# Patient Record
Sex: Male | Born: 1955 | Race: Black or African American | Hispanic: No | Marital: Single | Smoking: Never smoker
Health system: Southern US, Community
[De-identification: ages and names within clinical notes are randomized; demographics above are authoritative.]

---

## 2005-10-20 ENCOUNTER — Encounter: Admission: RE | Admit: 2005-10-20 | Discharge: 2005-10-20 | Payer: Self-pay | Admitting: Family Medicine

## 2009-12-19 ENCOUNTER — Encounter: Admission: RE | Admit: 2009-12-19 | Discharge: 2009-12-19 | Payer: Self-pay | Admitting: Family Medicine

## 2011-01-06 ENCOUNTER — Other Ambulatory Visit: Payer: Self-pay | Admitting: Orthopedic Surgery

## 2011-01-06 ENCOUNTER — Ambulatory Visit
Admission: RE | Admit: 2011-01-06 | Discharge: 2011-01-06 | Disposition: A | Discharge status: Home / Self Care | Payer: BC Managed Care – PPO | Source: Ambulatory Visit | Attending: Orthopedic Surgery | Admitting: Orthopedic Surgery

## 2011-01-06 DIAGNOSIS — IMO0001 Reserved for inherently not codable concepts without codable children: Secondary | ICD-10-CM

## 2011-09-08 IMAGING — CR DG FOOT COMPLETE 3+V*L*
4 series · 4 of 4 positions shown · non-contrast
Comparison: None.

CLINICAL DATA: Pain, query gout

LEFT FOOT - COMPLETE 3+ VIEW

[t foot ap left]
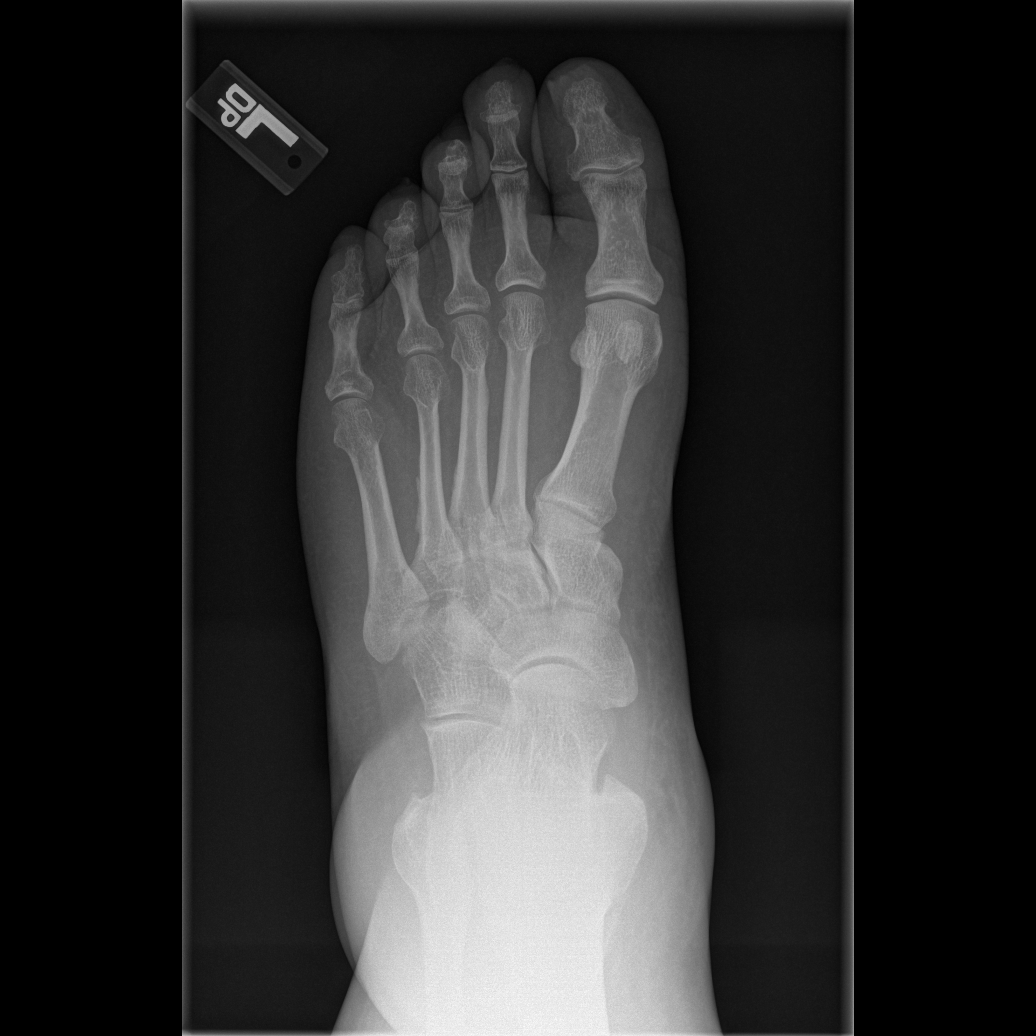

[t foot oblique left (1 of 2)]
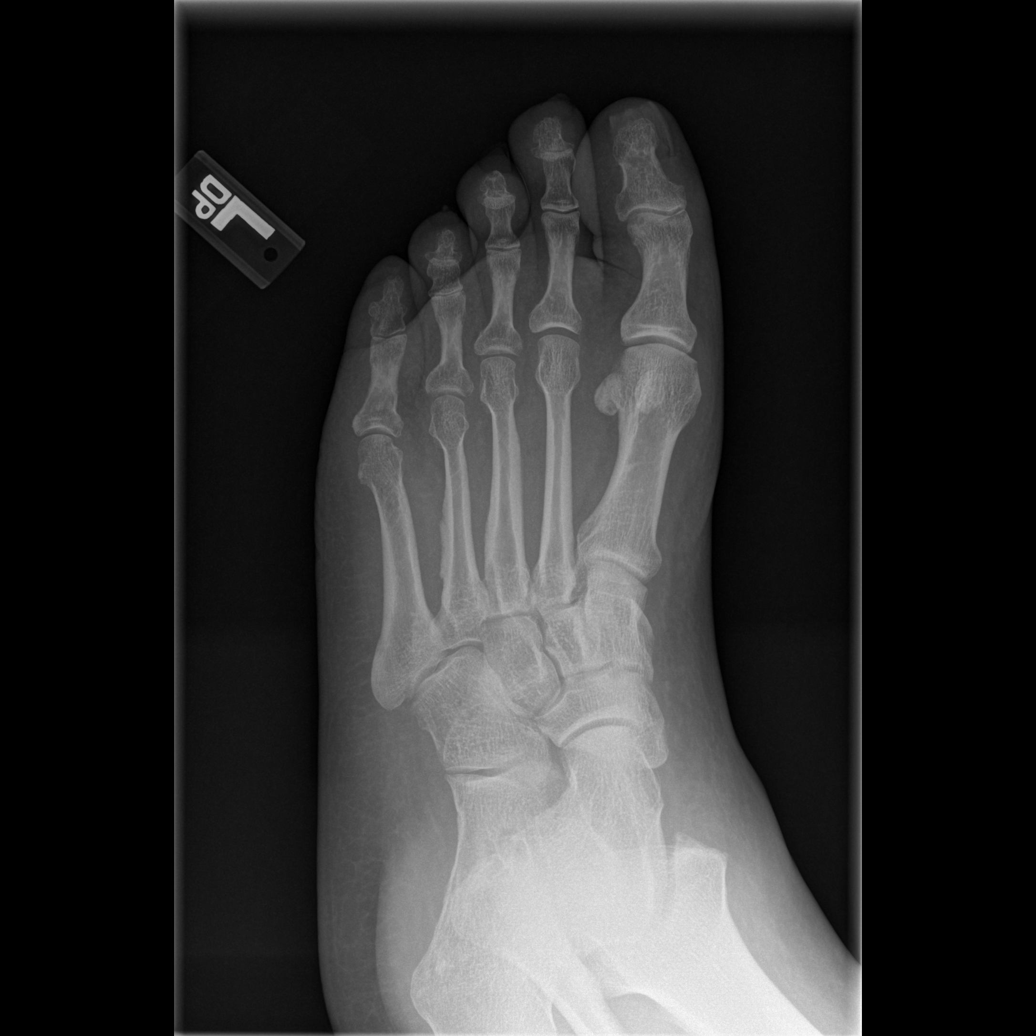

[t foot oblique left (2 of 2)]
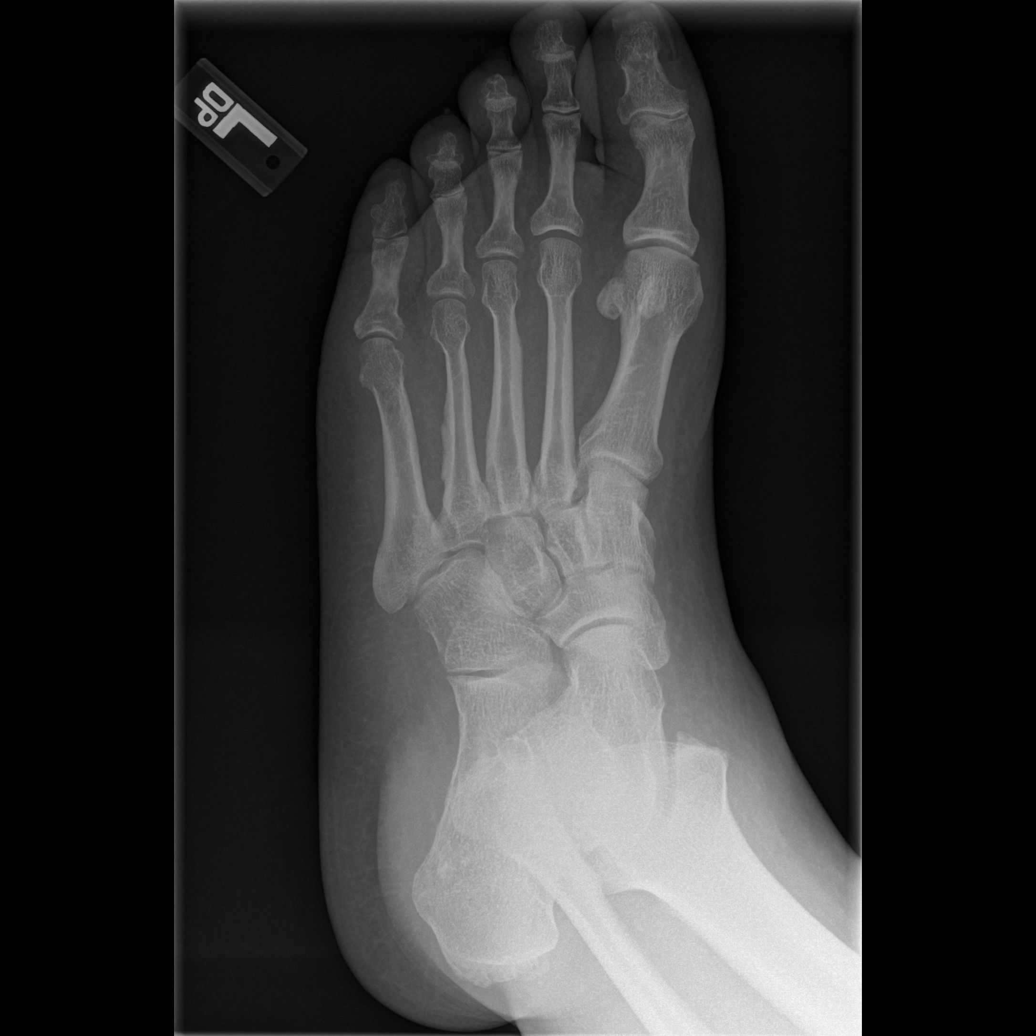

[t foot lat left]
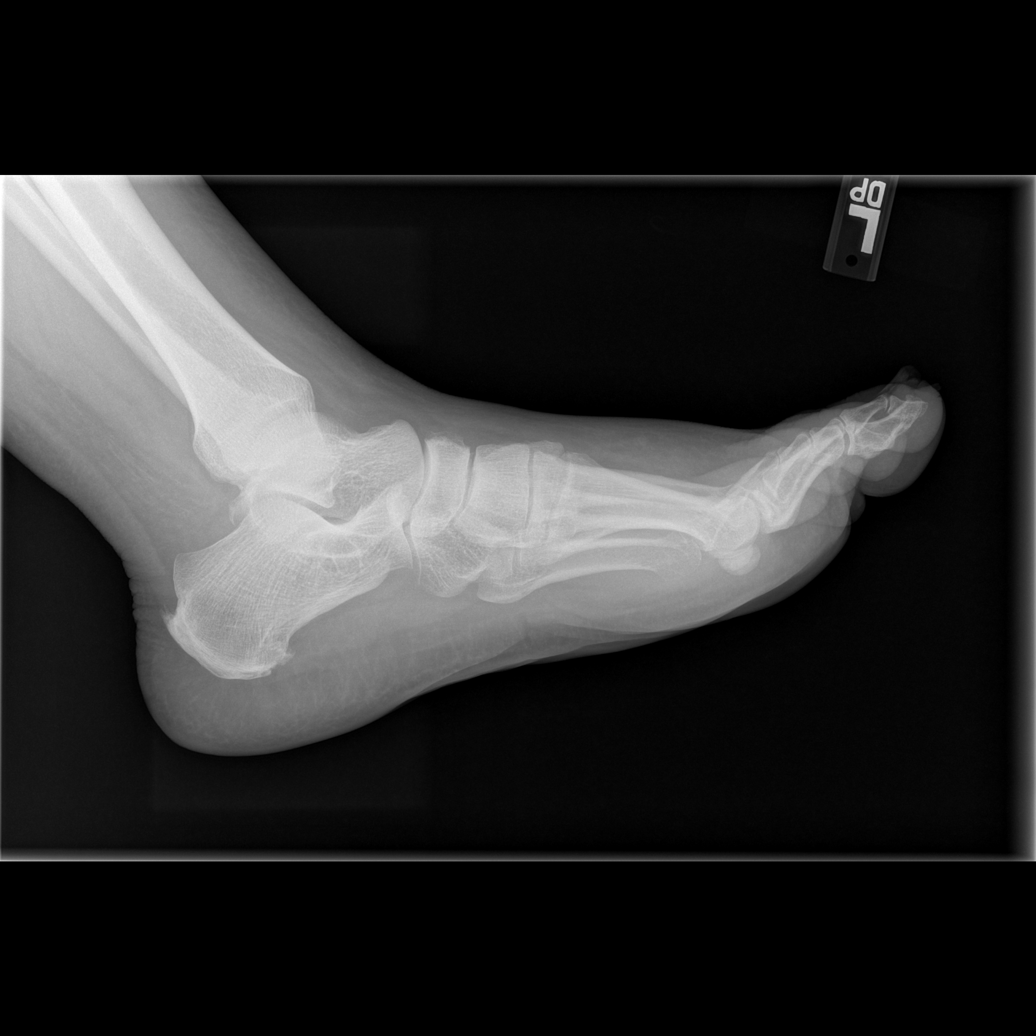

[4 of 4 positions shown; findings below may reference images not displayed]

FINDINGS: Joint spaces well maintained.  The metatarsal phalangeal
joint are normal.  There is subchondral cyst the proximal phalanx
of the first ray.  No soft tissue abnormality.  Normal bone
density.
IMPRESSION: 1.  No specific findings of gout.
2.  Mild osteoarthritis at the DIP joint of the first ray.

## 2012-09-25 IMAGING — CR DG KNEE 1-2V*L*
2 series · 2 of 2 positions shown · non-contrast
Comparison: 12/19/2009

CLINICAL DATA: Strain of the medial collateral ligament of the left
knee.

LEFT KNEE - 1-2 VIEW

[t knee ap left]
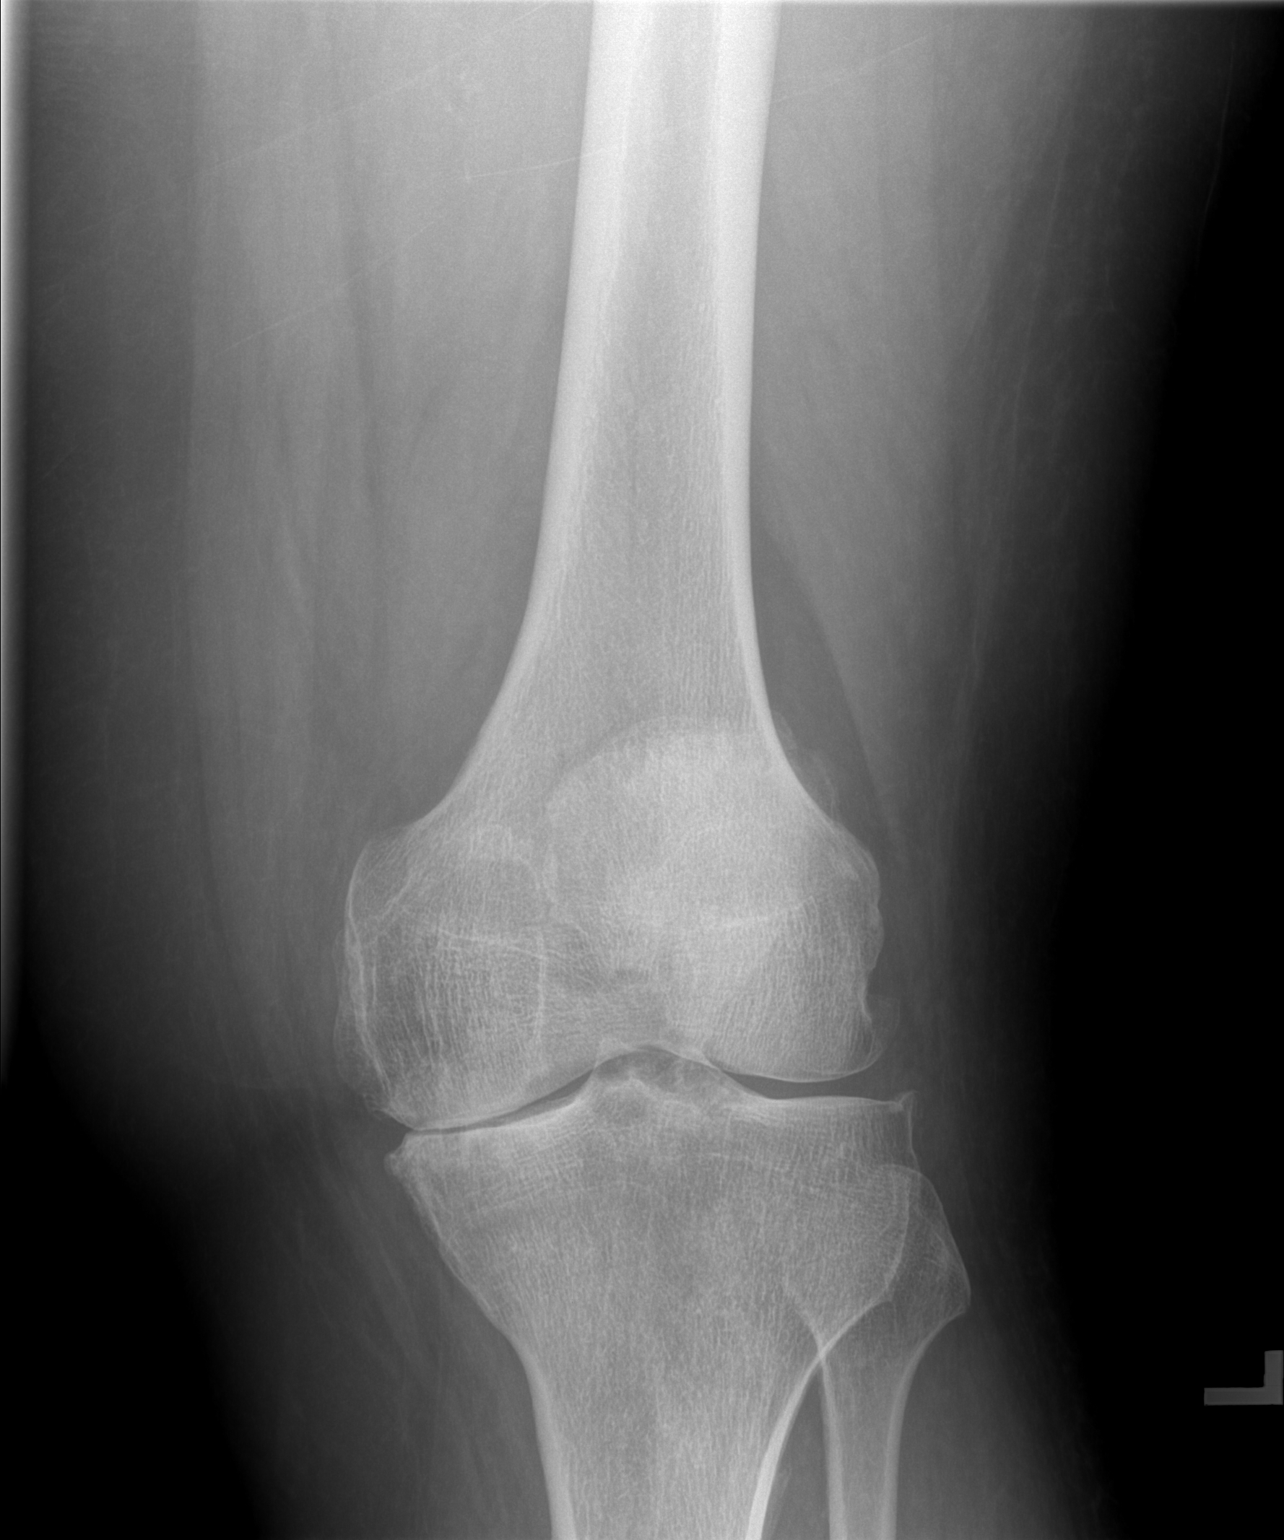

[t knee lat left]
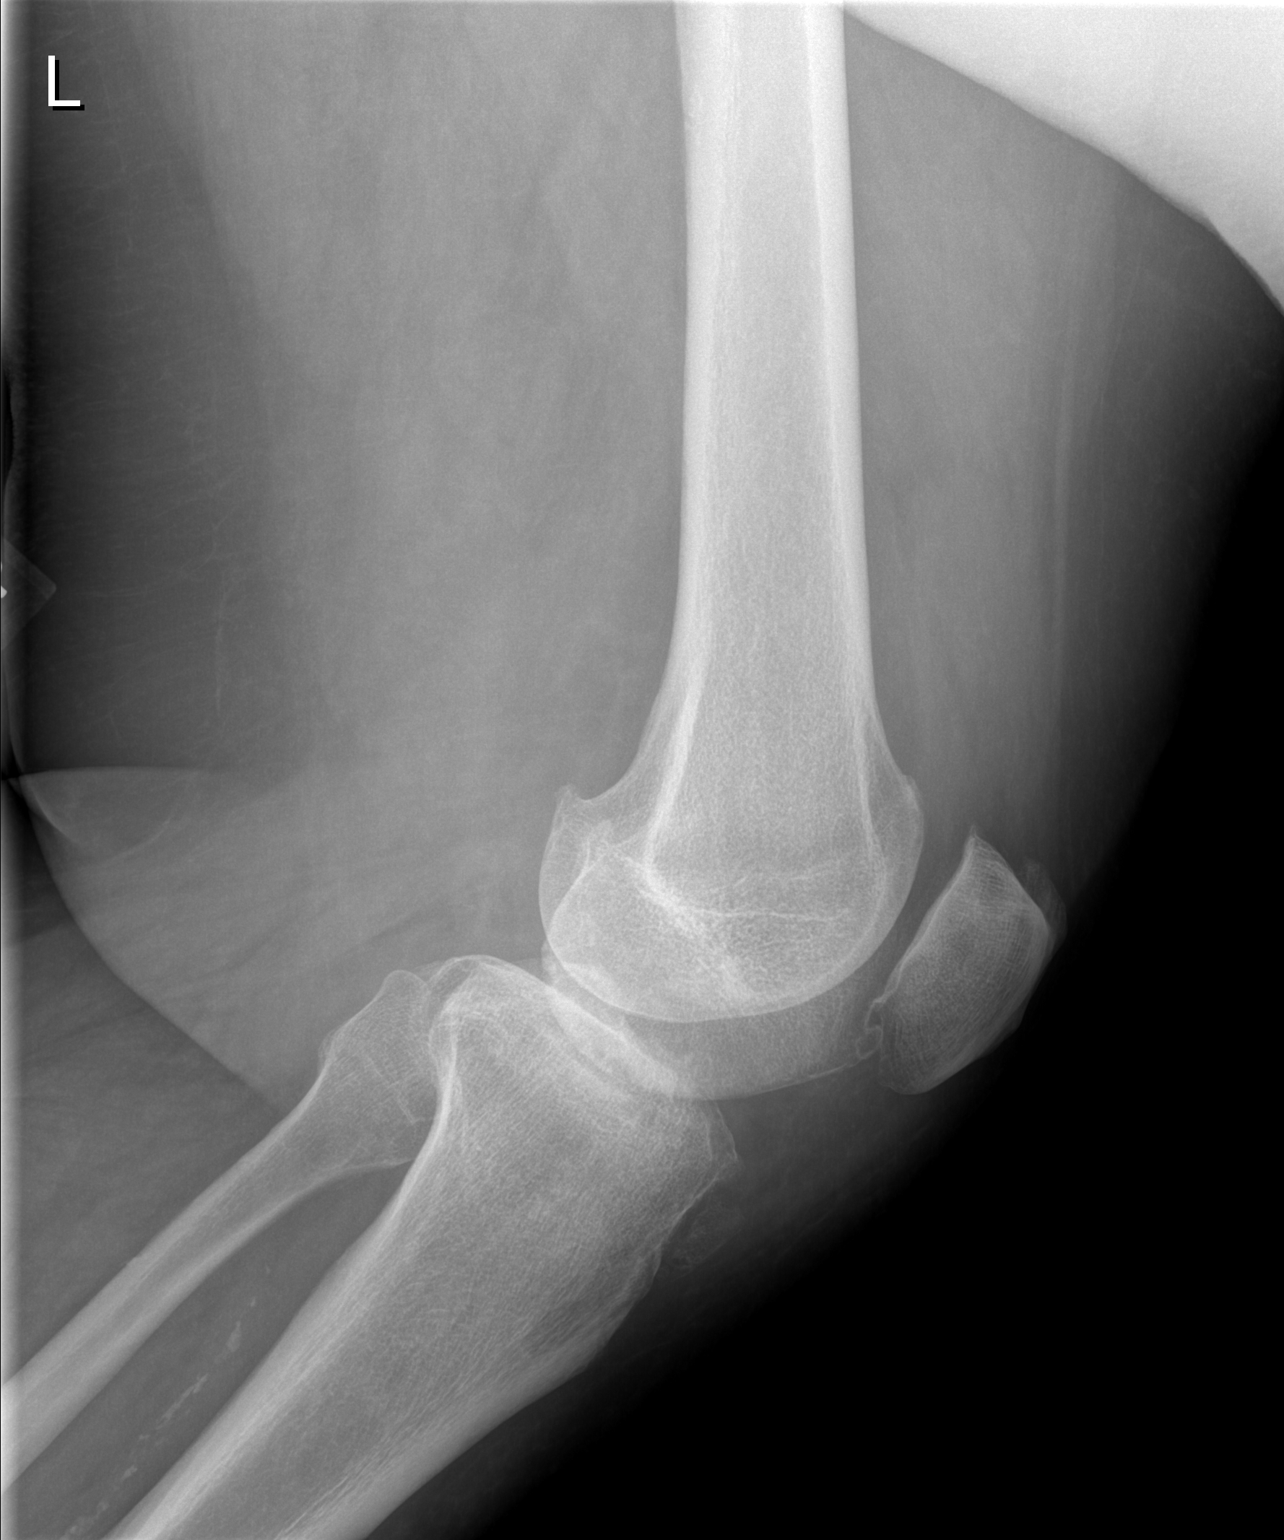

[2 of 2 positions shown; findings below may reference images not displayed]

FINDINGS: There is increased narrowing of the medial joint space
with sclerosis and slight subchondral cyst formation in the medial
tibial plateau with slight marginal spur formation medially and
laterally.  There is a small joint effusion.

No acute osseous abnormality.
IMPRESSION: Progressive osteoarthritis of the left knee, primarily in the
medial compartment.  Small joint effusion.

## 2022-05-02 ENCOUNTER — Telehealth: Payer: Self-pay | Admitting: *Deleted

## 2022-05-02 NOTE — Patient Outreach (Signed)
  Care Coordination   05/02/2022 Name: KHADIR ROAM MRN: [id number] DOB: 16-Jul-1956   Care Coordination Outreach Attempts:  An unsuccessful telephone outreach was attempted today to offer the patient information about available care coordination services as a benefit of their health plan.   Follow Up Plan:  Additional outreach attempts will be made to offer the patient care coordination information and services.   Encounter Outcome:  No Answer  Care Coordination Interventions Activated:  No   Care Coordination Interventions:  No, not indicated    Irving Shows [facility], BSN [facility] RN Care Coordinator [phone]

## 2022-05-05 ENCOUNTER — Telehealth: Payer: Self-pay | Admitting: *Deleted

## 2022-05-05 NOTE — Patient Outreach (Signed)
  Care Coordination   05/05/2022 Name: Levi [name] MRN: [id number] DOB: 1956-07-18   Care Coordination Outreach Attempts:  A second unsuccessful outreach was attempted today to offer the patient with information about available care coordination services as a benefit of their health plan.     Follow Up Plan:  Additional outreach attempts will be made to offer the patient care coordination information and services.   Encounter Outcome:  No Answer  Care Coordination Interventions Activated:  No   Care Coordination Interventions:  No, not indicated    Irving Shows [facility], BSN [facility] RN Care Coordinator [phone]

## 2022-05-06 ENCOUNTER — Telehealth: Payer: Self-pay | Admitting: *Deleted

## 2022-05-06 NOTE — Patient Outreach (Signed)
  Care Coordination   05/06/2022 Name: Levi [name] MRN: [id number] DOB: [birth year]   Care Coordination Outreach Attempts:  A third unsuccessful outreach was attempted today to offer the patient with information about available care coordination services as a benefit of their health plan.   Follow Up Plan:  No further outreach attempts will be made at this time. We have been unable to contact the patient to offer or enroll patient in care coordination services  Encounter Outcome:  No Answer  Care Coordination Interventions Activated:  No   Care Coordination Interventions:  No, not indicated    Irving Shows [facility], BSN [facility] RN Care Coordinator [phone]

## 2023-07-14 DIAGNOSIS — Z5919 Other inadequate housing: Secondary | ICD-10-CM | POA: Diagnosis not present

## 2023-07-14 DIAGNOSIS — Z7982 Long term (current) use of aspirin: Secondary | ICD-10-CM | POA: Diagnosis not present

## 2023-07-14 DIAGNOSIS — I872 Venous insufficiency (chronic) (peripheral): Secondary | ICD-10-CM | POA: Diagnosis not present

## 2023-07-14 DIAGNOSIS — M159 Polyosteoarthritis, unspecified: Secondary | ICD-10-CM | POA: Diagnosis not present

## 2023-07-14 DIAGNOSIS — Z008 Encounter for other general examination: Secondary | ICD-10-CM | POA: Diagnosis not present

## 2023-07-14 DIAGNOSIS — Z8249 Family history of ischemic heart disease and other diseases of the circulatory system: Secondary | ICD-10-CM | POA: Diagnosis not present

## 2023-07-14 DIAGNOSIS — Z91199 Patient's noncompliance with other medical treatment and regimen due to unspecified reason: Secondary | ICD-10-CM | POA: Diagnosis not present

## 2023-07-14 DIAGNOSIS — Z6841 Body Mass Index (BMI) 40.0 and over, adult: Secondary | ICD-10-CM | POA: Diagnosis not present

## 2023-07-14 DIAGNOSIS — Z791 Long term (current) use of non-steroidal anti-inflammatories (NSAID): Secondary | ICD-10-CM | POA: Diagnosis not present

## 2023-07-14 DIAGNOSIS — K08109 Complete loss of teeth, unspecified cause, unspecified class: Secondary | ICD-10-CM | POA: Diagnosis not present

## 2023-07-14 DIAGNOSIS — R030 Elevated blood-pressure reading, without diagnosis of hypertension: Secondary | ICD-10-CM | POA: Diagnosis not present

## 2023-07-24 ENCOUNTER — Emergency Department (HOSPITAL_COMMUNITY): Payer: Self-pay

## 2023-07-24 ENCOUNTER — Inpatient Hospital Stay (HOSPITAL_COMMUNITY)
Admission: EM | Admit: 2023-07-24 | Discharge: 2023-08-26 | DRG: 291 | Disposition: E | Payer: Medicare HMO | Source: Ambulatory Visit | Attending: Internal Medicine | Admitting: Internal Medicine

## 2023-07-24 ENCOUNTER — Other Ambulatory Visit: Payer: Self-pay

## 2023-07-24 ENCOUNTER — Encounter (HOSPITAL_COMMUNITY): Payer: Self-pay

## 2023-07-24 DIAGNOSIS — K589 Irritable bowel syndrome without diarrhea: Secondary | ICD-10-CM | POA: Diagnosis present

## 2023-07-24 DIAGNOSIS — Z114 Encounter for screening for human immunodeficiency virus [HIV]: Secondary | ICD-10-CM | POA: Diagnosis not present

## 2023-07-24 DIAGNOSIS — J440 Chronic obstructive pulmonary disease with acute lower respiratory infection: Secondary | ICD-10-CM | POA: Diagnosis not present

## 2023-07-24 DIAGNOSIS — N179 Acute kidney failure, unspecified: Secondary | ICD-10-CM

## 2023-07-24 DIAGNOSIS — E1169 Type 2 diabetes mellitus with other specified complication: Secondary | ICD-10-CM | POA: Diagnosis not present

## 2023-07-24 DIAGNOSIS — I700 Atherosclerosis of aorta: Secondary | ICD-10-CM

## 2023-07-24 DIAGNOSIS — Z125 Encounter for screening for malignant neoplasm of prostate: Secondary | ICD-10-CM | POA: Diagnosis not present

## 2023-07-24 DIAGNOSIS — A419 Sepsis, unspecified organism: Secondary | ICD-10-CM | POA: Diagnosis not present

## 2023-07-24 DIAGNOSIS — Z781 Physical restraint status: Secondary | ICD-10-CM

## 2023-07-24 DIAGNOSIS — G253 Myoclonus: Secondary | ICD-10-CM | POA: Diagnosis not present

## 2023-07-24 DIAGNOSIS — R0902 Hypoxemia: Secondary | ICD-10-CM | POA: Diagnosis not present

## 2023-07-24 DIAGNOSIS — J449 Chronic obstructive pulmonary disease, unspecified: Secondary | ICD-10-CM | POA: Diagnosis not present

## 2023-07-24 DIAGNOSIS — I10 Essential (primary) hypertension: Secondary | ICD-10-CM | POA: Diagnosis not present

## 2023-07-24 DIAGNOSIS — E662 Morbid (severe) obesity with alveolar hypoventilation: Secondary | ICD-10-CM | POA: Diagnosis not present

## 2023-07-24 DIAGNOSIS — I480 Paroxysmal atrial fibrillation: Secondary | ICD-10-CM | POA: Diagnosis not present

## 2023-07-24 DIAGNOSIS — G9341 Metabolic encephalopathy: Secondary | ICD-10-CM | POA: Diagnosis not present

## 2023-07-24 DIAGNOSIS — I429 Cardiomyopathy, unspecified: Secondary | ICD-10-CM | POA: Diagnosis present

## 2023-07-24 DIAGNOSIS — K761 Chronic passive congestion of liver: Secondary | ICD-10-CM | POA: Diagnosis not present

## 2023-07-24 DIAGNOSIS — I5021 Acute systolic (congestive) heart failure: Secondary | ICD-10-CM | POA: Diagnosis not present

## 2023-07-24 DIAGNOSIS — N289 Disorder of kidney and ureter, unspecified: Secondary | ICD-10-CM | POA: Diagnosis not present

## 2023-07-24 DIAGNOSIS — I5082 Biventricular heart failure: Secondary | ICD-10-CM | POA: Diagnosis not present

## 2023-07-24 DIAGNOSIS — Z515 Encounter for palliative care: Secondary | ICD-10-CM | POA: Diagnosis not present

## 2023-07-24 DIAGNOSIS — J9602 Acute respiratory failure with hypercapnia: Secondary | ICD-10-CM | POA: Diagnosis not present

## 2023-07-24 DIAGNOSIS — Z1159 Encounter for screening for other viral diseases: Secondary | ICD-10-CM | POA: Diagnosis not present

## 2023-07-24 DIAGNOSIS — I5043 Acute on chronic combined systolic (congestive) and diastolic (congestive) heart failure: Secondary | ICD-10-CM | POA: Diagnosis not present

## 2023-07-24 DIAGNOSIS — J9600 Acute respiratory failure, unspecified whether with hypoxia or hypercapnia: Secondary | ICD-10-CM | POA: Diagnosis not present

## 2023-07-24 DIAGNOSIS — R609 Edema, unspecified: Secondary | ICD-10-CM | POA: Diagnosis not present

## 2023-07-24 DIAGNOSIS — J9690 Respiratory failure, unspecified, unspecified whether with hypoxia or hypercapnia: Secondary | ICD-10-CM | POA: Diagnosis not present

## 2023-07-24 DIAGNOSIS — I272 Pulmonary hypertension, unspecified: Secondary | ICD-10-CM | POA: Diagnosis not present

## 2023-07-24 DIAGNOSIS — J690 Pneumonitis due to inhalation of food and vomit: Secondary | ICD-10-CM | POA: Diagnosis not present

## 2023-07-24 DIAGNOSIS — J189 Pneumonia, unspecified organism: Secondary | ICD-10-CM | POA: Diagnosis not present

## 2023-07-24 DIAGNOSIS — J9811 Atelectasis: Secondary | ICD-10-CM | POA: Diagnosis not present

## 2023-07-24 DIAGNOSIS — Z6841 Body Mass Index (BMI) 40.0 and over, adult: Secondary | ICD-10-CM | POA: Diagnosis not present

## 2023-07-24 DIAGNOSIS — K567 Ileus, unspecified: Secondary | ICD-10-CM | POA: Diagnosis not present

## 2023-07-24 DIAGNOSIS — M1710 Unilateral primary osteoarthritis, unspecified knee: Secondary | ICD-10-CM | POA: Diagnosis present

## 2023-07-24 DIAGNOSIS — J441 Chronic obstructive pulmonary disease with (acute) exacerbation: Secondary | ICD-10-CM | POA: Diagnosis not present

## 2023-07-24 DIAGNOSIS — E871 Hypo-osmolality and hyponatremia: Secondary | ICD-10-CM | POA: Diagnosis present

## 2023-07-24 DIAGNOSIS — E1122 Type 2 diabetes mellitus with diabetic chronic kidney disease: Secondary | ICD-10-CM | POA: Diagnosis not present

## 2023-07-24 DIAGNOSIS — E875 Hyperkalemia: Secondary | ICD-10-CM | POA: Diagnosis not present

## 2023-07-24 DIAGNOSIS — Z791 Long term (current) use of non-steroidal anti-inflammatories (NSAID): Secondary | ICD-10-CM

## 2023-07-24 DIAGNOSIS — I472 Ventricular tachycardia, unspecified: Secondary | ICD-10-CM

## 2023-07-24 DIAGNOSIS — N170 Acute kidney failure with tubular necrosis: Secondary | ICD-10-CM | POA: Diagnosis not present

## 2023-07-24 DIAGNOSIS — Z0000 Encounter for general adult medical examination without abnormal findings: Secondary | ICD-10-CM | POA: Diagnosis not present

## 2023-07-24 DIAGNOSIS — Z79899 Other long term (current) drug therapy: Secondary | ICD-10-CM | POA: Diagnosis not present

## 2023-07-24 DIAGNOSIS — L89892 Pressure ulcer of other site, stage 2: Secondary | ICD-10-CM | POA: Diagnosis present

## 2023-07-24 DIAGNOSIS — I509 Heart failure, unspecified: Secondary | ICD-10-CM | POA: Diagnosis not present

## 2023-07-24 DIAGNOSIS — L8990 Pressure ulcer of unspecified site, unspecified stage: Secondary | ICD-10-CM | POA: Insufficient documentation

## 2023-07-24 DIAGNOSIS — R0989 Other specified symptoms and signs involving the circulatory and respiratory systems: Secondary | ICD-10-CM | POA: Diagnosis not present

## 2023-07-24 DIAGNOSIS — J9601 Acute respiratory failure with hypoxia: Secondary | ICD-10-CM | POA: Diagnosis not present

## 2023-07-24 DIAGNOSIS — T39315A Adverse effect of propionic acid derivatives, initial encounter: Secondary | ICD-10-CM | POA: Diagnosis present

## 2023-07-24 DIAGNOSIS — E876 Hypokalemia: Secondary | ICD-10-CM | POA: Diagnosis not present

## 2023-07-24 DIAGNOSIS — M1712 Unilateral primary osteoarthritis, left knee: Secondary | ICD-10-CM | POA: Diagnosis not present

## 2023-07-24 DIAGNOSIS — R918 Other nonspecific abnormal finding of lung field: Secondary | ICD-10-CM | POA: Diagnosis not present

## 2023-07-24 DIAGNOSIS — Z136 Encounter for screening for cardiovascular disorders: Secondary | ICD-10-CM | POA: Diagnosis not present

## 2023-07-24 DIAGNOSIS — R059 Cough, unspecified: Secondary | ICD-10-CM | POA: Diagnosis not present

## 2023-07-24 DIAGNOSIS — E1165 Type 2 diabetes mellitus with hyperglycemia: Secondary | ICD-10-CM | POA: Diagnosis present

## 2023-07-24 DIAGNOSIS — R600 Localized edema: Secondary | ICD-10-CM | POA: Diagnosis not present

## 2023-07-24 DIAGNOSIS — R569 Unspecified convulsions: Secondary | ICD-10-CM | POA: Diagnosis not present

## 2023-07-24 DIAGNOSIS — I2489 Other forms of acute ischemic heart disease: Secondary | ICD-10-CM | POA: Diagnosis not present

## 2023-07-24 DIAGNOSIS — J90 Pleural effusion, not elsewhere classified: Secondary | ICD-10-CM | POA: Diagnosis not present

## 2023-07-24 DIAGNOSIS — I4891 Unspecified atrial fibrillation: Secondary | ICD-10-CM | POA: Diagnosis not present

## 2023-07-24 DIAGNOSIS — J9859 Other diseases of mediastinum, not elsewhere classified: Secondary | ICD-10-CM | POA: Diagnosis not present

## 2023-07-24 DIAGNOSIS — I5023 Acute on chronic systolic (congestive) heart failure: Secondary | ICD-10-CM | POA: Diagnosis not present

## 2023-07-24 DIAGNOSIS — E870 Hyperosmolality and hypernatremia: Secondary | ICD-10-CM | POA: Diagnosis not present

## 2023-07-24 DIAGNOSIS — I77810 Thoracic aortic ectasia: Secondary | ICD-10-CM | POA: Diagnosis present

## 2023-07-24 DIAGNOSIS — J14 Pneumonia due to Hemophilus influenzae: Secondary | ICD-10-CM | POA: Diagnosis not present

## 2023-07-24 DIAGNOSIS — I5041 Acute combined systolic (congestive) and diastolic (congestive) heart failure: Secondary | ICD-10-CM | POA: Diagnosis not present

## 2023-07-24 DIAGNOSIS — J9622 Acute and chronic respiratory failure with hypercapnia: Secondary | ICD-10-CM

## 2023-07-24 DIAGNOSIS — Z66 Do not resuscitate: Secondary | ICD-10-CM | POA: Diagnosis not present

## 2023-07-24 DIAGNOSIS — I959 Hypotension, unspecified: Secondary | ICD-10-CM | POA: Diagnosis not present

## 2023-07-24 DIAGNOSIS — Z7982 Long term (current) use of aspirin: Secondary | ICD-10-CM

## 2023-07-24 DIAGNOSIS — Y95 Nosocomial condition: Secondary | ICD-10-CM | POA: Diagnosis not present

## 2023-07-24 DIAGNOSIS — J811 Chronic pulmonary edema: Secondary | ICD-10-CM | POA: Diagnosis not present

## 2023-07-24 DIAGNOSIS — Z452 Encounter for adjustment and management of vascular access device: Secondary | ICD-10-CM | POA: Diagnosis not present

## 2023-07-24 DIAGNOSIS — I471 Supraventricular tachycardia, unspecified: Secondary | ICD-10-CM | POA: Diagnosis not present

## 2023-07-24 DIAGNOSIS — J9621 Acute and chronic respiratory failure with hypoxia: Secondary | ICD-10-CM | POA: Diagnosis not present

## 2023-07-24 DIAGNOSIS — R6520 Severe sepsis without septic shock: Secondary | ICD-10-CM | POA: Diagnosis not present

## 2023-07-24 DIAGNOSIS — R4182 Altered mental status, unspecified: Secondary | ICD-10-CM | POA: Diagnosis not present

## 2023-07-24 DIAGNOSIS — R7989 Other specified abnormal findings of blood chemistry: Secondary | ICD-10-CM | POA: Diagnosis not present

## 2023-07-24 DIAGNOSIS — J984 Other disorders of lung: Secondary | ICD-10-CM | POA: Diagnosis present

## 2023-07-24 DIAGNOSIS — R0602 Shortness of breath: Secondary | ICD-10-CM | POA: Diagnosis not present

## 2023-07-24 DIAGNOSIS — K5730 Diverticulosis of large intestine without perforation or abscess without bleeding: Secondary | ICD-10-CM | POA: Diagnosis not present

## 2023-07-24 DIAGNOSIS — I5040 Unspecified combined systolic (congestive) and diastolic (congestive) heart failure: Secondary | ICD-10-CM | POA: Diagnosis not present

## 2023-07-24 DIAGNOSIS — T39015A Adverse effect of aspirin, initial encounter: Secondary | ICD-10-CM | POA: Diagnosis present

## 2023-07-24 DIAGNOSIS — R339 Retention of urine, unspecified: Secondary | ICD-10-CM | POA: Diagnosis not present

## 2023-07-24 DIAGNOSIS — I493 Ventricular premature depolarization: Secondary | ICD-10-CM | POA: Diagnosis not present

## 2023-07-24 DIAGNOSIS — K760 Fatty (change of) liver, not elsewhere classified: Secondary | ICD-10-CM | POA: Diagnosis not present

## 2023-07-24 DIAGNOSIS — R001 Bradycardia, unspecified: Secondary | ICD-10-CM | POA: Diagnosis not present

## 2023-07-24 DIAGNOSIS — Z4682 Encounter for fitting and adjustment of non-vascular catheter: Secondary | ICD-10-CM | POA: Diagnosis not present

## 2023-07-24 DIAGNOSIS — I517 Cardiomegaly: Secondary | ICD-10-CM | POA: Diagnosis not present

## 2023-07-24 DIAGNOSIS — K3189 Other diseases of stomach and duodenum: Secondary | ICD-10-CM | POA: Diagnosis not present

## 2023-07-24 LAB — URINALYSIS, COMPLETE (UACMP) WITH MICROSCOPIC
Bilirubin Urine: NEGATIVE
Glucose, UA: NEGATIVE mg/dL
Ketones, ur: NEGATIVE mg/dL
Nitrite: NEGATIVE
Protein, ur: 30 mg/dL — AB
Specific Gravity, Urine: 1.014 (ref 1.005–1.030)
WBC, UA: >50 WBC/hpf (ref 0–5)
pH: 5 (ref 5.0–8.0)

## 2023-07-24 LAB — CBC WITH DIFFERENTIAL/PLATELET
Abs Immature Granulocytes: 0.68 10*3/uL — ABNORMAL HIGH (ref 0.00–0.07)
Basophils Absolute: 0.1 10*3/uL (ref 0.0–0.1)
Basophils Relative: 0 %
Eosinophils Absolute: 0 10*3/uL (ref 0.0–0.5)
Eosinophils Relative: 0 %
HCT: 52 % (ref 39.0–52.0)
Hemoglobin: 15.6 g/dL (ref 13.0–17.0)
Immature Granulocytes: 6 %
Lymphocytes Relative: 8 %
Lymphs Abs: 0.9 10*3/uL (ref 0.7–4.0)
MCH: 26.2 pg (ref 26.0–34.0)
MCHC: 30 g/dL (ref 30.0–36.0)
MCV: 87.4 fL (ref 80.0–100.0)
Monocytes Absolute: 1 10*3/uL (ref 0.1–1.0)
Monocytes Relative: 9 %
Neutro Abs: 8.8 10*3/uL — ABNORMAL HIGH (ref 1.7–7.7)
Neutrophils Relative %: 77 %
Platelets: 184 10*3/uL (ref 150–400)
RBC: 5.95 MIL/uL — ABNORMAL HIGH (ref 4.22–5.81)
RDW: 18.5 % — ABNORMAL HIGH (ref 11.5–15.5)
WBC: 11.5 10*3/uL — ABNORMAL HIGH (ref 4.0–10.5)
nRBC: 1.7 % — ABNORMAL HIGH (ref 0.0–0.2)

## 2023-07-24 LAB — RAPID URINE DRUG SCREEN, HOSP PERFORMED
Amphetamines: NOT DETECTED
Barbiturates: NOT DETECTED
Benzodiazepines: NOT DETECTED
Cocaine: NOT DETECTED
Opiates: NOT DETECTED
Tetrahydrocannabinol: NOT DETECTED

## 2023-07-24 LAB — COMPREHENSIVE METABOLIC PANEL
ALT: 700 U/L — ABNORMAL HIGH (ref 0–44)
ALT: 666 U/L — ABNORMAL HIGH (ref 0–44)
AST: 475 U/L — ABNORMAL HIGH (ref 15–41)
AST: 426 U/L — ABNORMAL HIGH (ref 15–41)
Albumin: 4.4 g/dL (ref 3.5–5.0)
Albumin: 4.4 g/dL (ref 3.5–5.0)
Alkaline Phosphatase: 211 U/L — ABNORMAL HIGH (ref 38–126)
Alkaline Phosphatase: 198 U/L — ABNORMAL HIGH (ref 38–126)
Anion gap: 15 (ref 5–15)
Anion gap: 16 — ABNORMAL HIGH (ref 5–15)
BUN: 86 mg/dL — ABNORMAL HIGH (ref 8–23)
BUN: 91 mg/dL — ABNORMAL HIGH (ref 8–23)
CO2: 30 mmol/L (ref 22–32)
CO2: 26 mmol/L (ref 22–32)
Calcium: 8.9 mg/dL (ref 8.9–10.3)
Calcium: 8.5 mg/dL — ABNORMAL LOW (ref 8.9–10.3)
Chloride: 89 mmol/L — ABNORMAL LOW (ref 98–111)
Chloride: 90 mmol/L — ABNORMAL LOW (ref 98–111)
Creatinine, Ser: 3.6 mg/dL — ABNORMAL HIGH (ref 0.61–1.24)
Creatinine, Ser: 3.33 mg/dL — ABNORMAL HIGH (ref 0.61–1.24)
GFR, Estimated: 18 mL/min — ABNORMAL LOW (ref >60)
GFR, Estimated: 19 mL/min — ABNORMAL LOW (ref >60)
Glucose, Bld: 127 mg/dL — ABNORMAL HIGH (ref 70–99)
Glucose, Bld: 126 mg/dL — ABNORMAL HIGH (ref 70–99)
Potassium: 5.6 mmol/L — ABNORMAL HIGH (ref 3.5–5.1)
Potassium: 5.6 mmol/L — ABNORMAL HIGH (ref 3.5–5.1)
Sodium: 134 mmol/L — ABNORMAL LOW (ref 135–145)
Sodium: 132 mmol/L — ABNORMAL LOW (ref 135–145)
Total Bilirubin: 4.5 mg/dL — ABNORMAL HIGH (ref <1.2)
Total Bilirubin: 3.9 mg/dL — ABNORMAL HIGH (ref <1.2)
Total Protein: 8.7 g/dL — ABNORMAL HIGH (ref 6.5–8.1)
Total Protein: 8.7 g/dL — ABNORMAL HIGH (ref 6.5–8.1)

## 2023-07-24 LAB — RESP PANEL BY RT-PCR (RSV, FLU A&B, COVID)  RVPGX2
Influenza A by PCR: NEGATIVE
Influenza B by PCR: NEGATIVE
Resp Syncytial Virus by PCR: NEGATIVE
SARS Coronavirus 2 by RT PCR: NEGATIVE

## 2023-07-24 LAB — I-STAT CHEM 8, ED
BUN: 99 mg/dL — ABNORMAL HIGH (ref 8–23)
Calcium, Ion: 1.09 mmol/L — ABNORMAL LOW (ref 1.15–1.40)
Chloride: 91 mmol/L — ABNORMAL LOW (ref 98–111)
Creatinine, Ser: 3.1 mg/dL — ABNORMAL HIGH (ref 0.61–1.24)
Glucose, Bld: 123 mg/dL — ABNORMAL HIGH (ref 70–99)
HCT: 59 % — ABNORMAL HIGH (ref 39.0–52.0)
Hemoglobin: 20.1 g/dL — ABNORMAL HIGH (ref 13.0–17.0)
Potassium: 5.7 mmol/L — ABNORMAL HIGH (ref 3.5–5.1)
Sodium: 134 mmol/L — ABNORMAL LOW (ref 135–145)
TCO2: 33 mmol/L — ABNORMAL HIGH (ref 22–32)

## 2023-07-24 LAB — PHOSPHORUS: Phosphorus: 7.6 mg/dL — ABNORMAL HIGH (ref 2.5–4.6)

## 2023-07-24 LAB — I-STAT CG4 LACTIC ACID, ED
Lactic Acid, Venous: 1.3 mmol/L (ref 0.5–1.9)
Lactic Acid, Venous: 1.5 mmol/L (ref 0.5–1.9)

## 2023-07-24 LAB — BLOOD GAS, VENOUS
Acid-Base Excess: 0.9 mmol/L (ref 0.0–2.0)
Acid-base deficit: 0.5 mmol/L (ref 0.0–2.0)
Bicarbonate: 32.9 mmol/L — ABNORMAL HIGH (ref 20.0–28.0)
Bicarbonate: 34.3 mmol/L — ABNORMAL HIGH (ref 20.0–28.0)
O2 Saturation: 73.1 %
O2 Saturation: 45.3 %
Patient temperature: 37
Patient temperature: 37
pCO2, Ven: 111 mm[Hg] (ref 44–60)
pCO2, Ven: 113 mm[Hg] (ref 44–60)
pH, Ven: 7.08 — CL (ref 7.25–7.43)
pH, Ven: 7.09 — CL (ref 7.25–7.43)
pO2, Ven: 52 mm[Hg] — ABNORMAL HIGH (ref 32–45)
pO2, Ven: 36 mm[Hg] (ref 32–45)

## 2023-07-24 LAB — CBG MONITORING, ED: Glucose-Capillary: 119 mg/dL — ABNORMAL HIGH (ref 70–99)

## 2023-07-24 LAB — CK: Total CK: 396 U/L (ref 49–397)

## 2023-07-24 LAB — CREATININE, URINE, RANDOM: Creatinine, Urine: 300 mg/dL

## 2023-07-24 LAB — PROTIME-INR
INR: 1.5 — ABNORMAL HIGH (ref 0.8–1.2)
Prothrombin Time: 18.3 s — ABNORMAL HIGH (ref 11.4–15.2)

## 2023-07-24 LAB — TROPONIN I (HIGH SENSITIVITY)
Troponin I (High Sensitivity): 164 ng/L (ref <18)
Troponin I (High Sensitivity): 169 ng/L (ref <18)

## 2023-07-24 LAB — APTT: aPTT: 31 s (ref 24–36)

## 2023-07-24 LAB — AMMONIA: Ammonia: <10 umol/L (ref 9–35)

## 2023-07-24 LAB — BRAIN NATRIURETIC PEPTIDE: B Natriuretic Peptide: 798.8 pg/mL — ABNORMAL HIGH (ref 0.0–100.0)

## 2023-07-24 LAB — MAGNESIUM: Magnesium: 2.7 mg/dL — ABNORMAL HIGH (ref 1.7–2.4)

## 2023-07-24 MED ORDER — INSULIN ASPART 100 UNIT/ML IJ SOLN
0.0000 [IU] | INTRAMUSCULAR | Status: DC
  Administered 2023-07-26 – 2023-07-27 (×3): 1 [IU] via SUBCUTANEOUS
  Administered 2023-07-27 (×2): 2 [IU] via SUBCUTANEOUS
  Administered 2023-07-27: 1 [IU] via SUBCUTANEOUS
  Administered 2023-07-27 – 2023-07-28 (×3): 2 [IU] via SUBCUTANEOUS
  Administered 2023-07-28: 1 [IU] via SUBCUTANEOUS
  Administered 2023-07-28 (×2): 2 [IU] via SUBCUTANEOUS
  Administered 2023-07-28 – 2023-07-29 (×2): 1 [IU] via SUBCUTANEOUS
  Administered 2023-07-29: 3 [IU] via SUBCUTANEOUS
  Administered 2023-07-29 (×5): 2 [IU] via SUBCUTANEOUS
  Administered 2023-07-30: 3 [IU] via SUBCUTANEOUS
  Filled 2023-07-24: qty 0.09

## 2023-07-24 MED ORDER — FENTANYL 2500MCG IN NS 250ML (10MCG/ML) PREMIX INFUSION
0.0000 ug/h | INTRAVENOUS | Status: DC
  Administered 2023-07-25: 25 ug/h via INTRAVENOUS
  Administered 2023-07-25: 175 ug/h via INTRAVENOUS
  Administered 2023-07-25: 50 ug/h via INTRAVENOUS
  Administered 2023-07-26: 125 ug/h via INTRAVENOUS
  Administered 2023-07-27: 150 ug/h via INTRAVENOUS
  Administered 2023-07-27 – 2023-07-29 (×2): 125 ug/h via INTRAVENOUS
  Administered 2023-07-29: 100 ug/h via INTRAVENOUS
  Filled 2023-07-24 (×7): qty 250

## 2023-07-24 MED ORDER — MIDAZOLAM HCL 5 MG/5ML IJ SOLN
INTRAMUSCULAR | Status: AC | PRN
  Administered 2023-07-24: 4 mg via INTRAVENOUS

## 2023-07-24 MED ORDER — ETOMIDATE 2 MG/ML IV SOLN
20.0000 mg | Freq: Once | INTRAVENOUS | Status: DC
  Filled 2023-07-24: qty 10

## 2023-07-24 MED ORDER — HEPARIN (PORCINE) 25000 UT/250ML-% IV SOLN
1900.0000 [IU]/h | INTRAVENOUS | Status: DC
  Administered 2023-07-24: 1900 [IU]/h via INTRAVENOUS
  Filled 2023-07-24: qty 250

## 2023-07-24 MED ORDER — NYSTATIN 100000 UNIT/GM EX CREA
TOPICAL_CREAM | Freq: Three times a day (TID) | CUTANEOUS | Status: DC
  Administered 2023-07-25 – 2023-08-05 (×15): 1 via TOPICAL
  Filled 2023-07-24 (×3): qty 30

## 2023-07-24 MED ORDER — FAMOTIDINE IN NACL 20-0.9 MG/50ML-% IV SOLN
20.0000 mg | INTRAVENOUS | Status: DC
  Administered 2023-07-25 – 2023-07-29 (×5): 20 mg via INTRAVENOUS
  Filled 2023-07-24 (×5): qty 50

## 2023-07-24 MED ORDER — DEXTROSE 50 % IV SOLN
1.0000 | Freq: Once | INTRAVENOUS | Status: AC
  Administered 2023-07-25: 50 mL via INTRAVENOUS
  Filled 2023-07-24: qty 50

## 2023-07-24 MED ORDER — MAGNESIUM SULFATE 2 GM/50ML IV SOLN
2.0000 g | Freq: Once | INTRAVENOUS | Status: AC
  Administered 2023-07-25: 2 g via INTRAVENOUS
  Filled 2023-07-24: qty 50

## 2023-07-24 MED ORDER — FUROSEMIDE 10 MG/ML IJ SOLN
60.0000 mg | Freq: Once | INTRAMUSCULAR | Status: AC
  Administered 2023-07-24: 60 mg via INTRAVENOUS
  Filled 2023-07-24: qty 8

## 2023-07-24 MED ORDER — MIDAZOLAM HCL 2 MG/2ML IJ SOLN
4.0000 mg | Freq: Once | INTRAMUSCULAR | Status: DC
  Filled 2023-07-24: qty 4

## 2023-07-24 MED ORDER — FENTANYL CITRATE PF 50 MCG/ML IJ SOSY
50.0000 ug | PREFILLED_SYRINGE | Freq: Once | INTRAMUSCULAR | Status: AC
  Administered 2023-07-25: 50 ug via INTRAVENOUS
  Filled 2023-07-24: qty 1

## 2023-07-24 MED ORDER — ACETAMINOPHEN 325 MG PO TABS
650.0000 mg | ORAL_TABLET | Freq: Four times a day (QID) | ORAL | Status: DC | PRN
  Administered 2023-07-27 – 2023-07-28 (×2): 650 mg via ORAL
  Filled 2023-07-24 (×2): qty 2

## 2023-07-24 MED ORDER — POLYETHYLENE GLYCOL 3350 17 G PO PACK: 17.0000 g | PACK | Freq: Every day | ORAL | Status: DC | PRN

## 2023-07-24 MED ORDER — ONDANSETRON HCL 4 MG/2ML IJ SOLN
4.0000 mg | Freq: Four times a day (QID) | INTRAMUSCULAR | Status: DC | PRN
  Administered 2023-08-01 (×2): 4 mg via INTRAVENOUS
  Filled 2023-07-24 (×2): qty 2

## 2023-07-24 MED ORDER — ASPIRIN 325 MG PO TBEC
325.0000 mg | DELAYED_RELEASE_TABLET | Freq: Every day | ORAL | Status: DC
  Filled 2023-07-24 (×3): qty 1

## 2023-07-24 MED ORDER — MIDAZOLAM HCL (PF) 5 MG/ML IJ SOLN: 4.0000 mg | Freq: Once | INTRAMUSCULAR | Status: DC

## 2023-07-24 MED ORDER — METOPROLOL TARTRATE 5 MG/5ML IV SOLN
5.0000 mg | Freq: Four times a day (QID) | INTRAVENOUS | Status: DC
  Filled 2023-07-24: qty 5

## 2023-07-24 MED ORDER — HEPARIN BOLUS VIA INFUSION
6000.0000 [IU] | Freq: Once | INTRAVENOUS | Status: AC
  Administered 2023-07-24: 6000 [IU] via INTRAVENOUS
  Filled 2023-07-24: qty 6000

## 2023-07-24 MED ORDER — INSULIN ASPART 100 UNIT/ML IV SOLN
5.0000 [IU] | Freq: Once | INTRAVENOUS | Status: AC
  Administered 2023-07-25: 5 [IU] via INTRAVENOUS
  Filled 2023-07-24: qty 0.05

## 2023-07-24 MED ORDER — DEXMEDETOMIDINE BOLUS VIA INFUSION
1.0000 ug/kg | Freq: Once | INTRAVENOUS | Status: DC
  Filled 2023-07-24 (×3): qty 207
  Filled 2023-07-24: qty 206.4
  Filled 2023-07-24: qty 207

## 2023-07-24 MED ORDER — ETOMIDATE 2 MG/ML IV SOLN
INTRAVENOUS | Status: AC | PRN
  Administered 2023-07-24: 20 mg via INTRAVENOUS

## 2023-07-24 MED ORDER — DOCUSATE SODIUM 100 MG PO CAPS: 100.0000 mg | ORAL_CAPSULE | Freq: Two times a day (BID) | ORAL | Status: DC | PRN

## 2023-07-24 MED ORDER — FUROSEMIDE 10 MG/ML IJ SOLN
80.0000 mg | Freq: Four times a day (QID) | INTRAMUSCULAR | Status: DC
  Administered 2023-07-25 (×2): 80 mg via INTRAVENOUS
  Filled 2023-07-24 (×2): qty 8

## 2023-07-24 NOTE — Progress Notes (Signed)
PHARMACY - ANTICOAGULATION CONSULT NOTE  Pharmacy Consult for Heparin Indication: pulmonary embolus  No Known Allergies  Patient Measurements: Height: 5\' 7"  (170.2 cm) Weight: (!) 206.4 kg (455 lb) IBW/kg (Calculated) : 66.1 Heparin Dosing Weight: 120 kg  Vital Signs: Temp: 97.5 F (36.4 C) (11/29 1843) Temp Source: Oral (11/29 1843) BP: 190/151 (11/29 1843) Pulse Rate: 87 (11/29 1955)  Labs: Recent Labs    07/24/23 1558 07/24/23 1815  HGB  --  15.6  HCT  --  52.0  PLT  --  184  CREATININE 3.60*  --   TROPONINIHS 164* 169*    Estimated Creatinine Clearance: 33.9 mL/min (A) (by C-G formula based on SCr of 3.6 mg/dL (H)).   Medical History: History reviewed. No pertinent past medical history.  Medications:  (Not in a hospital admission)  Infusions:   Assessment: 39 yoM presented to ED on 11/29 with cough, SOB, and leg swelling.  Pharmacy is consulted to dose heparin while PE is ruled out.   No prior to admission anticoagulation, but takes Aspirin 81mg  and Naproxen.   SCr 3.6 CBC: Hgb 15.6, Plt 184 Baseline coags ordered AST/ALT significantly elevated 475/700.  Tbili elevated at 4.5 (If Tbili > 6.6, will need to use aPTT for dosing instead of heparin levels)   Goal of Therapy:  Heparin level 0.3-0.7 units/ml Monitor platelets by anticoagulation protocol: Yes   Plan:  Give heparin 6000 units bolus IV x 1 Start heparin IV infusion at 1900 units/hr Heparin level 8 hours after starting Daily heparin level and CBC Follow up VQ Scan results   Lynann Beaver PharmD, BCPS [facility] main pharmacy [phone] 07/24/2023 8:57 PM

## 2023-07-24 NOTE — ED Provider Notes (Signed)
[place] EMERGENCY DEPARTMENT AT [facility] Provider Note   CSN: [id number] Arrival date & time: 07/24/23  1423     History  Chief Complaint  Patient presents with   Shortness of Breath   Cough    Levi [name] is a 67 y.o. male with no known medical history presented with shortness of breath.  Patient has been short of breath for the past 2 weeks however went to doctor's appointment today to become established and was noted to be hypoxic at 64% and was told to go to the ER after receiving albuterol treatment.  Patient states the physician he saw states he might have COPD.  Patient does have nonproductive cough as well.  Patient Nuys any chest pain but does endorse shortness of breath that is present at rest.  Patient does feel that his legs are both enlarged.  Patient does not normally wear oxygen.  Patient denies any fevers nausea vomiting, abdominal pain, sick contacts.  Home Medications Prior to Admission medications   Medication Sig Start Date End Date Taking? Authorizing Provider  aspirin EC 81 MG tablet Take 81 mg by mouth daily. Swallow whole.   Yes [provider]  naproxen sodium (ALEVE) 220 MG tablet Take 220 mg by mouth at bedtime.   Yes [provider]      Allergies    Patient has no known allergies.    Review of Systems   Review of Systems  Respiratory:  Positive for cough and shortness of breath.     Physical Exam Updated Vital Signs BP (!) 116/93   Pulse 75   Temp (!) 97.5 F (36.4 C) (Oral)   Resp 19   Ht 5\' 7"  (1.702 m)   Wt (!) 206.4 kg   SpO2 100%   BMI 71.26 kg/m  Physical Exam Constitutional:      General: He is not in acute distress. HENT:     Head: Normocephalic and atraumatic.  Neck:     Vascular: No JVD.  Cardiovascular:     Rate and Rhythm: Normal rate and regular rhythm.     Pulses: Normal pulses.     Heart sounds: Normal heart sounds.  Pulmonary:     Comments: Increased work of breathing 5 L of  oxygen nasal cannula Right-sided crackles Diminished lung sounds in the left side Nonproductive cough Voice does appear to be rhonchorous Able to speak in full sentences Abdominal:     Palpations: Abdomen is soft.     Tenderness: There is no abdominal tenderness. There is no guarding or rebound.  Musculoskeletal:     Cervical back: Normal range of motion and neck supple.     Comments: 2+ bilateral pitting edema Overlying skin color changes or warmth or wounds  Skin:    General: Skin is warm and dry.  Neurological:     Mental Status: He is alert and oriented to person, place, and time.  Psychiatric:        Mood and Affect: Mood normal.     ED Results / Procedures / Treatments   Labs (all labs ordered are listed, but only abnormal results are displayed) Labs Reviewed  COMPREHENSIVE METABOLIC PANEL - Abnormal; Notable for the following components:      Result Value   Sodium 134 (*)    Potassium 5.6 (*)    Chloride 89 (*)    Glucose, Bld 127 (*)    BUN 86 (*)    Creatinine, Ser 3.60 (*)  Total Protein 8.7 (*)    AST 475 (*)    ALT 700 (*)    Alkaline Phosphatase 211 (*)    Total Bilirubin 4.5 (*)    GFR, Estimated 18 (*)    All other components within normal limits  CBC WITH DIFFERENTIAL/PLATELET - Abnormal; Notable for the following components:   WBC 11.5 (*)    RBC 5.95 (*)    RDW 18.5 (*)    nRBC 1.7 (*)    Neutro Abs 8.8 (*)    Abs Immature Granulocytes 0.68 (*)    All other components within normal limits  BRAIN NATRIURETIC PEPTIDE - Abnormal; Notable for the following components:   B Natriuretic Peptide 798.8 (*)    All other components within normal limits  BLOOD GAS, VENOUS - Abnormal; Notable for the following components:   pH, Ven 7.08 (*)    pCO2, Ven 111 (*)    pO2, Ven 52 (*)    Bicarbonate 32.9 (*)    All other components within normal limits  PHOSPHORUS - Abnormal; Notable for the following components:   Phosphorus 7.6 (*)    All other  components within normal limits  MAGNESIUM - Abnormal; Notable for the following components:   Magnesium 2.7 (*)    All other components within normal limits  COMPREHENSIVE METABOLIC PANEL - Abnormal; Notable for the following components:   Sodium 132 (*)    Potassium 5.6 (*)    Chloride 90 (*)    Glucose, Bld 126 (*)    BUN 91 (*)    Creatinine, Ser 3.33 (*)    Calcium 8.5 (*)    Total Protein 8.7 (*)    AST 426 (*)    ALT 666 (*)    Alkaline Phosphatase 198 (*)    Total Bilirubin 3.9 (*)    GFR, Estimated 19 (*)    Anion gap 16 (*)    All other components within normal limits  PROTIME-INR - Abnormal; Notable for the following components:   Prothrombin Time 18.3 (*)    INR 1.5 (*)    All other components within normal limits  I-STAT CHEM 8, ED - Abnormal; Notable for the following components:   Sodium 134 (*)    Potassium 5.7 (*)    Chloride 91 (*)    BUN 99 (*)    Creatinine, Ser 3.10 (*)    Glucose, Bld 123 (*)    Calcium, Ion 1.09 (*)    TCO2 33 (*)    Hemoglobin 20.1 (*)    HCT 59.0 (*)    All other components within normal limits  TROPONIN I (HIGH SENSITIVITY) - Abnormal; Notable for the following components:   Troponin I (High Sensitivity) 164 (*)    All other components within normal limits  TROPONIN I (HIGH SENSITIVITY) - Abnormal; Notable for the following components:   Troponin I (High Sensitivity) 169 (*)    All other components within normal limits  RESP PANEL BY RT-PCR (RSV, FLU A&B, COVID)  RVPGX2  RESPIRATORY PANEL BY PCR  CBC WITH DIFFERENTIAL/PLATELET  CK  AMMONIA  APTT  URINALYSIS, COMPLETE (UACMP) WITH MICROSCOPIC  CREATININE, URINE, RANDOM  OSMOLALITY, URINE  PREALBUMIN  HEPATITIS PANEL, ACUTE  PROCALCITONIN  RAPID URINE DRUG SCREEN, HOSP PERFORMED  ACETAMINOPHEN LEVEL  LACTIC ACID, PLASMA  LACTIC ACID, PLASMA  HEPARIN LEVEL (UNFRACTIONATED)  BLOOD GAS, VENOUS  D-DIMER, QUANTITATIVE  HEMOGLOBIN A1C  TSH  T4, FREE  LIPID PANEL  CBC   COMPREHENSIVE METABOLIC PANEL  MAGNESIUM  HIV ANTIBODY (ROUTINE TESTING W REFLEX)  I-STAT CG4 LACTIC ACID, ED  I-STAT CG4 LACTIC ACID, ED  TROPONIN I (HIGH SENSITIVITY)  TROPONIN I (HIGH SENSITIVITY)    EKG EKG Interpretation Date/Time:  Friday July 24 2023 16:14:21 EST Ventricular Rate:  77 PR Interval:  190 QRS Duration:  108 QT Interval:  379 QTC Calculation: 429 R Axis:   111  Text Interpretation: Sinus rhythm Right axis deviation Low voltage, precordial leads Confirmed by Fulton Reek [phone]) on 07/24/2023 5:53:26 PM  Radiology CT CHEST ABDOMEN PELVIS WO CONTRAST  Result Date: 07/24/2023 CLINICAL DATA:  Shortness of breath EXAM: CT CHEST, ABDOMEN AND PELVIS WITHOUT CONTRAST TECHNIQUE: Multidetector CT imaging of the chest, abdomen and pelvis was performed following the standard protocol without IV contrast. RADIATION DOSE REDUCTION: This exam was performed according to the departmental dose-optimization program which includes automated exposure control, adjustment of the mA and/or kV according to patient size and/or use of iterative reconstruction technique. COMPARISON:  None Available. FINDINGS: CT CHEST FINDINGS Cardiovascular: Cardiomegaly. No pericardial effusion. Thoracic aorta is nonaneurysmal. Atherosclerotic calcifications of the aorta and coronary arteries. Central pulmonary vasculature is moderately dilated. Mediastinum/Nodes: No enlarged mediastinal, hilar, or axillary lymph nodes. Thyroid gland, trachea, and esophagus demonstrate no significant findings. Lungs/Pleura: Mild bibasilar subsegmental atelectasis. Lungs are otherwise clear. No pleural effusion or pneumothorax. Musculoskeletal: No acute osseous abnormality. Multilevel bridging anterior endplate osteophytes throughout the thoracic spine compatible with diffuse idiopathic skeletal hyperostosis. Gynecomastia is present on the left. CT ABDOMEN PELVIS FINDINGS Hepatobiliary: Unremarkable unenhanced appearance  of the liver. There may be layering sludge within the gallbladder. No pericholecystic inflammatory changes by CT. No biliary dilatation. Pancreas: Unremarkable. No pancreatic ductal dilatation or surrounding inflammatory changes. Spleen: Normal in size without focal abnormality. Adrenals/Urinary Tract: Adrenal glands are unremarkable. Kidneys are normal, without renal calculi, focal lesion, or hydronephrosis. Bladder is unremarkable. Stomach/Bowel: Stomach is within normal limits. Scattered colonic diverticulosis. No evidence of bowel wall thickening, distention, or inflammatory changes. Vascular/Lymphatic: Aortic atherosclerosis. No enlarged abdominal or pelvic lymph nodes. Reproductive: Prostate is unremarkable. Other: No free fluid. No abdominopelvic fluid collection. No pneumoperitoneum. No abdominal wall hernia. Musculoskeletal: Skin thickening and subcutaneous edema of the lower anterior abdominal wall/pannus. No fluid collections. No acute osseous abnormality. Appearance of the proximal right femur with coarsened trabecula suggestive of Paget's disease. IMPRESSION: 1. Skin thickening and subcutaneous edema of the lower anterior abdominal wall/pannus. Correlate for cellulitis. No fluid collections. 2. Otherwise, no acute intrathoracic or intra-abdominal findings. 3. Cardiomegaly with moderate dilatation of the central pulmonary vasculature, which can be seen in the setting of pulmonary hypertension. 4. Colonic diverticulosis without evidence of acute diverticulitis. 5. Appearance of the proximal right femur with coarsened trabecula suggestive of Paget's disease. 6. Aortic atherosclerosis (ICD10-I70.0). Electronically Signed   By: Duanne Guess D.O.   On: 07/24/2023 19:31   DG Chest 2 View  Result Date: 07/24/2023 CLINICAL DATA:  Shortness of breath. EXAM: CHEST - 2 VIEW COMPARISON:  None Available. FINDINGS: Limited exam. Low lung volume. Mild central vascular congestion, likely accentuated by low lung  volume. No frank pulmonary edema. Bilateral lung fields are otherwise clear. No acute consolidation or lung collapse. Bilateral costophrenic angles are clear. Moderately enlarged cardio-mediastinal silhouette, which is accentuated by low lung volume and AP technique. No acute osseous abnormalities. The soft tissues are within normal limits. IMPRESSION: *Limited exam.  No active cardiopulmonary disease. Correlate clinically to determine the need for additional imaging with chest CT scan. Electronically Signed  By: Jules Schick M.D.   On: 07/24/2023 15:55    Procedures Ultrasound ED Echo  Date/Time: 07/24/2023 5:32 PM  Performed by: Netta Corrigan, PA-C Authorized by: Netta Corrigan, PA-C   Procedure details:    Indications: dyspnea     Views: subxiphoid, parasternal long axis view, apical 4 chamber view and IVC view     Images: archived     Limitations:  Body habitus Findings:    Pericardium: no pericardial effusion     Cardiac Activity: normal cardiac activity     LV Function: depressed (30 - 50%)     RV Diameter: normal     IVC: normal   Impression:    Impression: abnormal cardiac activity   .Critical Care  Performed by: Netta Corrigan, PA-C Authorized by: Netta Corrigan, PA-C   Critical care provider statement:    Critical care time (minutes):  120   Critical care time was exclusive of:  Separately billable procedures and treating other patients   Critical care was necessary to treat or prevent imminent or life-threatening deterioration of the following conditions:  Respiratory failure   Critical care was time spent personally by me on the following activities:  Blood draw for specimens, development of treatment plan with patient or surrogate, discussions with consultants, evaluation of patient's response to treatment, examination of patient, obtaining history from patient or surrogate, review of old charts, re-evaluation of patient's condition, pulse oximetry, ordering and  review of radiographic studies, ordering and review of laboratory studies and ordering and performing treatments and interventions   I assumed direction of critical care for this patient from another provider in my specialty: no     Care discussed with: admitting provider       Medications Ordered in ED Medications  heparin ADULT infusion 100 units/mL (25000 units/261mL) (1,900 Units/hr Intravenous New Bag/Given 07/24/23 2125)  insulin aspart (novoLOG) injection 5 Units (has no administration in time range)    And  dextrose 50 % solution 50 mL (has no administration in time range)  docusate sodium (COLACE) capsule 100 mg (has no administration in time range)  polyethylene glycol (MIRALAX / GLYCOLAX) packet 17 g (has no administration in time range)  aspirin EC tablet 325 mg (has no administration in time range)  insulin aspart (novoLOG) injection 0-9 Units (has no administration in time range)  furosemide (LASIX) injection 60 mg (60 mg Intravenous Given 07/24/23 1739)  heparin bolus via infusion 6,000 Units (6,000 Units Intravenous Bolus from Bag 07/24/23 2120)    ED Course/ Medical Decision Making/ A&P                                 Medical Decision Making Amount and/or Complexity of Data Reviewed Labs: ordered. Radiology: ordered.  Risk Prescription drug management. Decision regarding hospitalization.   Barbee Cough 67 y.o. presented today for shortness of breath.  Working DDx that I considered at this time includes, but not limited to, respiratory distress CHF, asthma/COPD exacerbation, URI, viral illness, anemia, ACS, PE, pneumonia, pleural effusion, lung cancer.  R/o DDx: asthma/COPD exacerbation, URI, viral illness, anemia, ACS, pneumonia, pleural effusion, lung cancer: These are considered less likely due to history of present illness, physical exam, labs/imaging findings  Review of prior external notes: None  Unique Tests and My Interpretation:  CBC: Mild  leukocytosis 11.5 BMP: Hypokalemia 5.6, chloride 89, creatinine 3.6, BUN 86, AST ALT 475/700, alk phos  211, total bilirubin 4.5, GFR 18 EKG: Sinus 77 bpm, no ST elevations or depressions noted, no blocks noted Troponin: 164, 169 CXR: Some vascular congestion however limited exam due to body habitus CT chest abdomen pelvis without contrast: Pulmonary hypertension, possible cellulitis in the pannus region, possible Paget's disease in proximal right femur BNP: 798.8 Lactic acid: 1.3 Respiratory panel: Negative  Social Determinants of Health: uninsured  Discussion with Independent Historian:  Emily member  Discussion of Management of Tests:  [place], MD Hospitalist ; JD [place], [place] Critical [facility]  Risk: High: hospitalization or escalation of hospital-level care  Risk Stratification Score: None  Staffed with Earlene Plater, MD  Plan: On exam patient was in no acute distress with stable vitals however was noted on 5 L nasal cannula oxygen which is new.  On exam patient has a wheeze in the right side along with diminished airway in the left and does appear to have rhonchorous voice.  Patient does have pitting edema bilaterally as well and so I suspect he has CHF that has been undiagnosed patient is not seeing a primary care provider in quite some time.  Will obtain labs and give breathing treatments and plan on admission.  Troponin did come back elevated at 164.  Patient is not having any chest pain and his EKG is reassuring and so do suspect this may be demand ischemia given that he is on 5 L of nasal cannula as he was 64% on room air.  Patient also does have significant transaminitis and bilirubin along with electrolyte derangements and AKI that all could be stemming from his fluid overload and so we will give Lasix.  Bedside echo was limited due to body habitus however does not show pericardial effusion and IVC does not appear abnormal.  Still waiting on BNP however given the fact the patient has elevated  troponin with shortness of breath we wanted to get a CTA to rule out PE however due to his AKI we cannot give contrast and so we will do a CT chest abdomen pelvis without contrast to further look into his lungs and aorta.  At this time we do have a very low suspicion of PE given that he appears fluid overloaded and CHF would explain his symptoms and so will not proceed with VQ scan at this time.  Patient's VBG did come back with significant CO2 retention and respiratory acidosis.  The attending I spoke to the hospitalist and agree that patient's symptoms are most likely from patient's body habitus is hospitalist does not believe patient is "wet."  Will consult critical care as patient does appear to be decompensating since he is been here.  Attending spoke with critical care and they will come down to evaluate the patient.  On recheck after being placed on BiPAP patient's somnolent state improve.  ICU physician is assessing patient.  As he will be admitting.  Patient stable for admission.  This chart was dictated using voice recognition software.  Despite best efforts to proofread,  errors can occur which can change the documentation meaning.         Final Clinical Impression(s) / ED Diagnoses Final diagnoses:  Acute respiratory failure with hypoxia (HCC)  Congestive heart failure, unspecified HF chronicity, unspecified heart failure type (HCC)  Hyperkalemia  AKI (acute kidney injury) (HCC)  Elevated troponin  Aortic atherosclerosis (HCC)  Pulmonary hypertension (HCC)    Rx / DC Orders ED Discharge Orders     None  Netta Corrigan, PA-C 07/24/23 2300    Laurence Spates, MD 07/25/23 [phone]

## 2023-07-24 NOTE — H&P (Signed)
NAME:  Levi [name], MRN:  [id number], DOB:  1956-02-06, LOS: 0 ADMISSION DATE:  07/24/2023, CONSULTATION DATE:  07/24/2023 REFERRING MD:  Laurence Spates, MD , CHIEF COMPLAINT:  hypoxia and SOB  History of Present Illness:  A 67 yr old male patient with morbid obesity, probably untreated OSA/OHS, and knee OA, who presented to ED with dry cough, DOE, SOB, LL edema, orthopnea for 1-2 weeks. Home health RN found his SpO2 64% on RA. Given a dose of Albuterol. Denied wheezing, CP, N/V/D, abd pain, f/c/r, and rash. Not on home O2 or NIV.  No smoking, illicit drug use, or alcoholism.  No hx of COPD, asthma, VTE, cardiac hx, liver disease, or CKD Developed runs of VT in ED. No EKG changes initially.   Pertinent  Medical History  As above  Significant Hospital Events: Including procedures, antibiotic start and stop dates in addition to other pertinent events     Interim History / Subjective:    Objective   Blood pressure (!) 116/93, pulse 75, temperature (!) 97.5 F (36.4 C), temperature source Oral, resp. rate 19, height 5\' 7"  (1.702 m), weight (!) 206.4 kg, SpO2 100%.    FiO2 (%):  [30 %] 30 %  No intake or output data in the 24 hours ending 07/24/23 2218 Filed Weights   07/24/23 1452  Weight: (!) 206.4 kg    Examination: General: alert, oriented x4, and comfortable on BiPAP 18/8/30%. SpO2 100%  HENT: PERL, normal pharynx and oral mucosa. No LNE or thyromegaly. + JVD Lungs: symmetrical air entry bilaterally. Diffuse crackles. No wheezing Cardiovascular: NL S1/S2. No m/g/r Abdomen: no distension or tenderness Extremities: +3 edema. Symmetrical  Neuro: nonfocal   Maceration of skin in the groin areas   Resolved Hospital Problem list     Assessment & Plan:  CHF exacerbation with acute hypoxic resp failure and acute on chronic hypercapnic resp. IV Lasix Fluid restriction I/O chart Consult cardiology Echo Repeat ABG May intubate  Serial Trop D-Dimer D/c IV Heparin   Use subcutaneous heparin for DVT prophylaxis  D/w family  Reviewed CT chest and report   2. Runs of VT Metoprolol IV 5 mg q 6 hrs Mg 2 mg IV x1  3. Morbid obesity (BMI 71.2)  4. Elevated Trop due to CHF exacerbation (type 2) Serial Trop Echo  5. Hyperk due to acute/chronic resp acidosis Serial BMP  6. Probably OSA/OHS Needs outpt NPSG BiPAP for now  7. HypoNa due to fluid overload (CHF) Monitor Na level  8. Elevated LFT due to congestive hepatopathy Abd U/S Monitor labs Hepatitis panel  9. AKI due to cardiorenal syndrome and possible NSAIDs daily use (on ASA and Naproxen for knee OA) Consult nephrology Renal U/S I/O chart  Best Practice (right click and "Reselect all SmartList Selections" daily)   Diet/type: NPO w/ oral meds DVT prophylaxis:    Pressure ulcer(s): not present on admission  GI prophylaxis: H2B Lines: N/A Foley:  Yes, and it is still needed Code Status:  full code Last date of multidisciplinary goals of care discussion []   Labs   CBC: Recent Labs  Lab 07/24/23 1558 07/24/23 1815  WBC  --  11.5*  NEUTROABS Not Measured 8.8*  HGB  --  15.6  HCT  --  52.0  MCV  --  87.4  PLT  --  184    Basic Metabolic Panel: Recent Labs  Lab 07/24/23 1558 07/24/23 2024  NA 134*  --   K 5.6*  --  CL 89*  --   CO2 30  --   GLUCOSE 127*  --   BUN 86*  --   CREATININE 3.60*  --   CALCIUM 8.9  --   MG  --  2.7*  PHOS  --  7.6*   GFR: Estimated Creatinine Clearance: 33.9 mL/min (A) (by C-G formula based on SCr of 3.6 mg/dL (H)). Recent Labs  Lab 07/24/23 1815 07/24/23 1822 07/24/23 2049  WBC 11.5*  --   --   LATICACIDVEN  --  1.3 1.5    Liver Function Tests: Recent Labs  Lab 07/24/23 1558  AST 475*  ALT 700*  ALKPHOS 211*  BILITOT 4.5*  PROT 8.7*  ALBUMIN 4.4   No results for input(s): "LIPASE", "AMYLASE" in the last 168 hours. Recent Labs  Lab 07/24/23 2035  AMMONIA <10    ABG    Component Value Date/Time   HCO3 32.9  (H) 07/24/2023 2040   ACIDBASEDEF 0.5 07/24/2023 2040   O2SAT 73.1 07/24/2023 2040     Coagulation Profile: Recent Labs  Lab 07/24/23 2054  INR 1.5*    Cardiac Enzymes: Recent Labs  Lab 07/24/23 2024  CKTOTAL 396    HbA1C: No results found for: "HGBA1C"  CBG: No results for input(s): "GLUCAP" in the last 168 hours.  Review of Systems:   Review of Systems  Constitutional:  Positive for malaise/fatigue. Negative for chills, diaphoresis and fever.  Respiratory:  Positive for cough and shortness of breath. Negative for hemoptysis, sputum production and wheezing.   Cardiovascular:  Positive for orthopnea, leg swelling and PND. Negative for chest pain, palpitations and claudication.  Gastrointestinal:  Negative for abdominal pain, diarrhea, heartburn, nausea and vomiting.  Genitourinary:  Negative for dysuria, frequency and urgency.  Musculoskeletal:  Positive for joint pain. Negative for back pain and neck pain.  Skin:  Negative for itching and rash.  Neurological:  Negative for dizziness and headaches.     Past Medical History:  He,  has no past medical history on file.   Surgical History:  History reviewed. No pertinent surgical history.   Social History:   reports that he has never smoked. He has never used smokeless tobacco.   Family History:  His family history is not on file.   Allergies No Known Allergies   Home Medications  Prior to Admission medications   Medication Sig Start Date End Date Taking? Authorizing Provider  aspirin EC 81 MG tablet Take 81 mg by mouth daily. Swallow whole.   Yes [provider]  naproxen sodium (ALEVE) 220 MG tablet Take 220 mg by mouth at bedtime.   Yes [provider]     Critical care time: 60 min

## 2023-07-24 NOTE — Plan of Care (Signed)
Levi [name] MVH:[id number] DOB: [birth year] DOA: 07/24/2023     PCP: No primary care provider on file.   Outpatient Specialists: NONE  Patient arrived to ER on 07/24/23 at 1423 Referred by Attending Laurence Spates, MD   Patient coming from:    home Lives With family    Chief Complaint:   Chief Complaint  Patient presents with   Shortness of Breath   Cough    HPI: Levi [name] is a 67 y.o. male with medical history significant of obesity     Presented with  hypoxia Patient presents with cough and shortness of breath since last week was given a dose of albuterol's found to be satting 64% on room air felt to have COPD exacerbation reports shortness of breath has been ongoing for at least 2 weeks he was sent to emergency department nonproductive cough no chest pain or shortness of breath noted bilateral leg edema on at baseline not on oxygen no fevers no chills no nausea no vomiting Not requiring up to 5 L of nasal cannula Patient does not seek medical attention regular basis While in the ER noted to have elevated troponin at 164 Transaminitis AKI Bedside echogram done showed no pericardial effusion Patient for full some time now has been having daytime sleepiness and sedation.  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 a hard time ambulating and usually preaches while sitting 1 emergency department patient required O2 supplementation at some point he was on as high as 7 L patient became somewhat somnolent although the with family felt that he does that a lot at home given severity VBG was done that showed severe hypercapnic failure at which point PCCM was consulted and patient was started on BiPAP  Denies  significant ETOH intake   Does not smoke   Lab Results  Component Value Date   SARSCOV2NAA NEGATIVE 07/24/2023      While in ER:          Lab Orders         Resp panel by RT-PCR (RSV, Flu A&B, Covid) Anterior Nasal Swab         CBC with Differential         Comprehensive metabolic panel         CBC with Differential/Platelet         Brain natriuretic peptide         Blood gas, venous         I-Stat CG4 Lactic Acid       CT chestCTabd/pelvis - Skin thickening and subcutaneous edema of the lower anterior abdominal wall/pannus. Correlate for cellulitis.  Cardiomegaly with moderate dilatation of the central pulmonary vasculature, which can be seen in the setting of pulmonary hypertension. Paget's disease.      Following Medications were ordered in ER: Medications  furosemide (LASIX) injection 60 mg (60 mg Intravenous Given 07/24/23 1739)    _______    ED Triage Vitals  Encounter Vitals Group     BP 07/24/23 1447 (!) 156/91     Systolic BP Percentile --      Diastolic BP Percentile --      Pulse Rate 07/24/23 1447 82  Resp 07/24/23 1447 20     Temp 07/24/23 1447 97.7 F (36.5 C)     Temp Source 07/24/23 1447 Oral     SpO2 07/24/23 1447 95 %     Weight 07/24/23 1452 (!) 455 lb (206.4 kg)     Height 07/24/23 1452 5\' 7"  (1.702 m)     Head Circumference --      Peak Flow --      Pain Score 07/24/23 1438 0     Pain Loc --      Pain Education --      Exclude from Growth Chart --   NGEX(52)@     _________________________________________ Significant initial  Findings: Abnormal Labs Reviewed  COMPREHENSIVE METABOLIC PANEL - Abnormal; Notable for the following components:      Result Value   Sodium 134 (*)    Potassium 5.6 (*)    Chloride 89 (*)    Glucose, Bld 127 (*)    BUN 86 (*)    Creatinine, Ser 3.60 (*)    Total Protein 8.7 (*)    AST 475 (*)    ALT 700 (*)    Alkaline Phosphatase 211 (*)    Total Bilirubin 4.5 (*)    GFR, Estimated 18 (*)    All  other components within normal limits  CBC WITH DIFFERENTIAL/PLATELET - Abnormal; Notable for the following components:   WBC 11.5 (*)    RBC 5.95 (*)    RDW 18.5 (*)    nRBC 1.7 (*)    Neutro Abs 8.8 (*)    Abs Immature Granulocytes 0.68 (*)    All other components within normal limits  BRAIN NATRIURETIC PEPTIDE - Abnormal; Notable for the following components:   B Natriuretic Peptide 798.8 (*)    All other components within normal limits  TROPONIN I (HIGH SENSITIVITY) - Abnormal; Notable for the following components:   Troponin I (High Sensitivity) 164 (*)    All other components within normal limits  TROPONIN I (HIGH SENSITIVITY) - Abnormal; Notable for the following components:   Troponin I (High Sensitivity) 169 (*)    All other components within normal limits  _________________________ Troponin ordered Cardiac Panel (last 3 results) Recent Labs    07/24/23 1558 07/24/23 1815  TROPONINIHS 164* 169*    ECG: Ordered Personally reviewed and interpreted by me showing: HR : 77 Rhythm:Sinus rhythm Right axis deviation Low voltage, precordial leads QTC 429  BNP (last 3 results) Recent Labs    07/24/23 1815  BNP 798.8*     COVID-19 Labs  No results for input(s): "DDIMER", "FERRITIN", "LDH", "CRP" in the last 72 hours.  Lab Results  Component Value Date   SARSCOV2NAA NEGATIVE 07/24/2023   __________   The recent clinical data is shown below. Vitals:   07/24/23 1843 07/24/23 1915 07/24/23 1935 07/24/23 1955  BP: (!) 190/151     Pulse: 79 82 83 87  Resp: (!) 22 14 (!) 23 19  Temp: (!) 97.5 F (36.4 C)     TempSrc: Oral     SpO2: 95% 95% 90% 93%  Weight:      Height:       WBC     Component Value Date/Time   WBC 11.5 (H) 07/24/2023 1815   LYMPHSABS 0.9 07/24/2023 1815   MONOABS 1.0 07/24/2023 1815   EOSABS 0.0 07/24/2023 1815   BASOSABS 0.1 07/24/2023 1815    Lactic Acid, Venous    Component Value Date/Time   LATICACIDVEN 1.3 07/24/2023  1822      Lactic Acid, Venous    Component Value Date/Time   LATICACIDVEN 1.3 07/24/2023 1822    Procalcitonin   Ordered      UA  ordered     Results for orders placed or performed during the hospital encounter of 07/24/23  Resp panel by RT-PCR (RSV, Flu A&B, Covid) Anterior Nasal Swab     Status: None   Collection Time: 07/24/23  4:15 PM   Specimen: Anterior Nasal Swab  Result Value Ref Range Status   SARS Coronavirus 2 by RT PCR NEGATIVE NEGATIVE Final         Influenza A by PCR NEGATIVE NEGATIVE Final   Influenza B by PCR NEGATIVE NEGATIVE Final         Resp Syncytial Virus by PCR NEGATIVE NEGATIVE Final           Venous  Blood Gas result:  pH    Latest Reference Range & Units 07/24/23 20:40  pH, Ven 7.25 - 7.43  7.08 (LL)  pCO2, Ven 44 - 60 mmHg 111 (HH)  pO2, Ven 32 - 45 mmHg 52 (H)  (LL): Data is critically low (HH): Data is critically high (H): Data is abnormally high  ABG    Component Value Date/Time   HCO3 32.9 (H) 07/24/2023 2040   ACIDBASEDEF 0.5 07/24/2023 2040   O2SAT 73.1 07/24/2023 2040     __________________________________________________________ Recent Labs  Lab 07/24/23 1558 07/24/23 2024  NA 134*  --   K 5.6*  --   CO2 30  --   GLUCOSE 127*  --   BUN 86*  --   CREATININE 3.60*  --   CALCIUM 8.9  --   MG  --  2.7*  PHOS  --  7.6*    Cr  Up from baseline see below Lab Results  Component Value Date   CREATININE 3.60 (H) 07/24/2023    Recent Labs  Lab 07/24/23 1558  AST 475*  ALT 700*  ALKPHOS 211*  BILITOT 4.5*  PROT 8.7*  ALBUMIN 4.4   Lab Results  Component Value Date   CALCIUM 8.9 07/24/2023    Plt: Lab Results  Component Value Date   PLT 184 07/24/2023    Recent Labs  Lab 07/24/23 1558 07/24/23 1815  WBC  --  11.5*  NEUTROABS Not Measured 8.8*  HGB  --  15.6  HCT  --  52.0  MCV  --  87.4  PLT  --  184    HG/HCT  stable,     Component Value Date/Time   HGB 15.6 07/24/2023 1815   HCT 52.0 07/24/2023 1815    MCV 87.4 07/24/2023 1815     No results for input(s): "LIPASE", "AMYLASE" in the last 168 hours. Recent Labs  Lab 07/24/23 2035  AMMONIA <10    _______________________________________________ Hospitalist was called for admission for   Acute respiratory failure with hypoxia and hypercarbia   Hyperkalemia  AKI   Elevated troponin  Aortic atherosclerosis (HCC)   Pulmonary hypertension (HCC)     The following Work up has been ordered so far:  Orders Placed This Encounter  Procedures   ED Echo US Bedside   Resp panel by RT-PCR (RSV, Flu A&B, Covid) Anterior Nasal Swab   DG Chest 2 View   CT CHEST ABDOMEN PELVIS WO CONTRAST   CBC with Differential   Comprehensive metabolic panel   CBC with Differential/Platelet   Brain natriuretic peptide   Blood gas, venous  ED Cardiac monitoring   Consult to hospitalist   Bipap   I-Stat CG4 Lactic Acid   ED EKG   EKG 12-Lead   Insert peripheral IV     OTHER Significant initial  Findings:  labs showing:     DM  labs:  HbA1C: No results for input(s): "HGBA1C" in the last 8760 hours.     CBG (last 3)  No results for input(s): "GLUCAP" in the last 72 hours.        Cultures: No results found for: "SDES", "SPECREQUEST", "CULT", "REPTSTATUS"   Radiological Exams on Admission: CT CHEST ABDOMEN PELVIS WO CONTRAST  Result Date: 07/24/2023 CLINICAL DATA:  Shortness of breath EXAM: CT CHEST, ABDOMEN AND PELVIS WITHOUT CONTRAST TECHNIQUE: Multidetector CT imaging of the chest, abdomen and pelvis was performed following the standard protocol without IV contrast. RADIATION DOSE REDUCTION: This exam was performed according to the departmental dose-optimization program which includes automated exposure control, adjustment of the mA and/or kV according to patient size and/or use of iterative reconstruction technique. COMPARISON:  None Available. FINDINGS: CT CHEST FINDINGS Cardiovascular: Cardiomegaly. No pericardial effusion. Thoracic aorta  is nonaneurysmal. Atherosclerotic calcifications of the aorta and coronary arteries. Central pulmonary vasculature is moderately dilated. Mediastinum/Nodes: No enlarged mediastinal, hilar, or axillary lymph nodes. Thyroid gland, trachea, and esophagus demonstrate no significant findings. Lungs/Pleura: Mild bibasilar subsegmental atelectasis. Lungs are otherwise clear. No pleural effusion or pneumothorax. Musculoskeletal: No acute osseous abnormality. Multilevel bridging anterior endplate osteophytes throughout the thoracic spine compatible with diffuse idiopathic skeletal hyperostosis. Gynecomastia is present on the left. CT ABDOMEN PELVIS FINDINGS Hepatobiliary: Unremarkable unenhanced appearance of the liver. There may be layering sludge within the gallbladder. No pericholecystic inflammatory changes by CT. No biliary dilatation. Pancreas: Unremarkable. No pancreatic ductal dilatation or surrounding inflammatory changes. Spleen: Normal in size without focal abnormality. Adrenals/Urinary Tract: Adrenal glands are unremarkable. Kidneys are normal, without renal calculi, focal lesion, or hydronephrosis. Bladder is unremarkable. Stomach/Bowel: Stomach is within normal limits. Scattered colonic diverticulosis. No evidence of bowel wall thickening, distention, or inflammatory changes. Vascular/Lymphatic: Aortic atherosclerosis. No enlarged abdominal or pelvic lymph nodes. Reproductive: Prostate is unremarkable. Other: No free fluid. No abdominopelvic fluid collection. No pneumoperitoneum. No abdominal wall hernia. Musculoskeletal: Skin thickening and subcutaneous edema of the lower anterior abdominal wall/pannus. No fluid collections. No acute osseous abnormality. Appearance of the proximal right femur with coarsened trabecula suggestive of Paget's disease. IMPRESSION: 1. Skin thickening and subcutaneous edema of the lower anterior abdominal wall/pannus. Correlate for cellulitis. No fluid collections. 2. Otherwise, no  acute intrathoracic or intra-abdominal findings. 3. Cardiomegaly with moderate dilatation of the central pulmonary vasculature, which can be seen in the setting of pulmonary hypertension. 4. Colonic diverticulosis without evidence of acute diverticulitis. 5. Appearance of the proximal right femur with coarsened trabecula suggestive of Paget's disease. 6. Aortic atherosclerosis (ICD10-I70.0). Electronically Signed   By: Duanne Guess D.O.   On: 07/24/2023 19:31   DG Chest 2 View  Result Date: 07/24/2023 CLINICAL DATA:  Shortness of breath. EXAM: CHEST - 2 VIEW COMPARISON:  None Available. FINDINGS: Limited exam. Low lung volume. Mild central vascular congestion, likely accentuated by low lung volume. No frank pulmonary edema. Bilateral lung fields are otherwise clear. No acute consolidation or lung collapse. Bilateral costophrenic angles are clear. Moderately enlarged cardio-mediastinal silhouette, which is accentuated by low lung volume and AP technique. No acute osseous abnormalities. The soft tissues are within normal limits. IMPRESSION: *Limited exam.  No active cardiopulmonary disease. Correlate clinically to determine the need  for additional imaging with chest CT scan. Electronically Signed   By: Jules Schick M.D.   On: 07/24/2023 15:55   _______________________________________________________________________________________________________ Latest  Blood pressure (!) 190/151, pulse 87, temperature (!) 97.5 F (36.4 C), temperature source Oral, resp. rate 19, height 5\' 7"  (1.702 m), weight (!) 206.4 kg, SpO2 93%.   ____________________________________________________________________________________ Allergies: No Known Allergies   Prior to Admission medications   Not on File    ___________________________________________________________________________________________________ Physical Exam:    07/24/2023    7:55 PM 07/24/2023    7:35 PM 07/24/2023    7:15 PM  Vitals with BMI  Pulse  87 83 82    1. General:  in No  Acute distress   Chronically ill -appearing 2. Psychological: somnolent Oriented 3. Head/ENT:   Moist  Mucous Membranes                          Head Non traumatic, neck supple                       Poor Dentition 4. SKIN: normal  Skin turgor,  Skin clean Dry and intact no rash    5. Heart: Regular rate and rhythm no  Murmur, no Rub or gallop 6. Lungs:distant, no wheezes or crackles   7. Abdomen: Soft,  non-tender, Non distended   obese  bowel sounds present 8. Lower extremities: no clubbing, cyanosis, 1 + edema 9. Neurologically Grossly intact, moving all 4 extremities equally asterixis 10. MSK: Normal range of motion    Chart has been reviewed  ______________________________________________________________________________________________  Assessment/Plan  67 y.o. male with medical history significant of obesity     Admitted for   Acute respiratory failure with hypoxia hypercarbia    Hyperkalemia  AKI   Elevated troponin     Aortic atherosclerosis (HCC)    Pulmonary hypertension (HCC) Recommend PCCM consult and admit to ICU as this pt is not stable for the floor at this time given significant acute respiratory hypoxic and hypercarbic failure high risk for potential intubation Suspect underlying CKD given elevated phosphate and potassium would probably benefit from nephrology consult Noted elevated troponin would benefit from echogram Given hypoxia would benefit from workup for PE VQ scan ordered patient discussed with ER starting heparin for now PCCM have seen pt and plan for elective intubation [facility] to sign off for now Pls call back as needed  Other plan as per orders.    Code Status:    Code Status: Not on file FULL CODE  as per patient  family   I had personally discussed CODE STATUS with patient and family  ACP   none    Family Communication:   Family   at  Bedside  plan of care was discussed with   Wife,      Level of care   ICU    Angelito Hopping 07/24/2023, 11:54 PM    [facility]     after 2 AM please page floor coverage PA If 7AM-7PM, please contact the day team taking care of the patient using [email or web address]

## 2023-07-24 NOTE — ED Triage Notes (Addendum)
Pt BIB EMS from Doctors office due to SOB and cough since last week. Doctor office administered 2.5 mg of Albuterol. SpO2 is 64% on RA. Pt reports he was told today that he has COPD. Lung sounds diminished.

## 2023-07-24 NOTE — Subjective & Objective (Signed)
Patient presents with cough and shortness of breath since last week was given a dose of albuterol's found to be satting 64% on room air felt to have COPD exacerbation reports shortness of breath has been ongoing for at least 2 weeks he was sent to emergency department nonproductive cough no chest pain or shortness of breath noted bilateral leg edema on at baseline not on oxygen no fevers no chills no nausea no vomiting Not requiring up to 5 L of nasal cannula Patient does not seek medical attention regular basis While in the ER noted to have elevated troponin at 164 Transaminitis AKI Bedside echogram done showed no pericardial effusion

## 2023-07-24 NOTE — ED Provider Notes (Signed)
67 year old male with no known medical history but never goes to the doctor presenting for shortness of breath.  He reports several weeks of worsening shortness of breath, orthopnea, lower extremity edema.  Went to the doctor for the first time today and they sent him here and concern for possible COPD.  I do not hear any wheezing here, his diminished breath sounds and significant bibasilar Rales.  He has significant lower extremity edema and some anasarca.  He reports decreased urine output.  His lab work is notable for hyperkalemia, no EKG changes but does have significant AKI as well as transaminitis and elevated bilirubin.  I think this is probably congestive hepatopathy.  His chest x-ray is very limited due to body habitus.  Clinically I am concerned for likely cardiorenal syndrome probably from heart failure.  He has no chest pain I have lower concern for acute ischemia although he could have some ischemic cardiomyopathy.  Consider PE as well although significant AKI limits evaluation and seems more likely heart failure based on his symptoms.  Since chest x-ray was still limited, we did add on a CT chest on pelvis without contrast to further evaluate for pulmonary edema, as well as evaluate his liver given his transaminitis.  We did give him some Lasix given he is clinically volume overloaded.  His CT scan does not show clear pulm edema but does show some signs of pulmonary hypertension.  I talked with Dr. Adela Glimpse with hospitalist.  He is stable on his 6 L although he is short of breath.  Added on a blood gas.  Has lactic acid here is normal.  His troponin is elevated but flat, likely due to demand ischemia given lack of chest pain.  I talked with Dr. Adela Glimpse, given known no contraindication and inability to evaluate for PE with evidence of pulm hypertension will start heparin empirically for possible PE which could explain his symptoms.  I reevaluated patient.  He has declined clinical has become slightly  confused.  His wife states he frequently does this at night  Blood gas shows significant respiratory acidosis.  BiPAP initiated.  Protecting his airway at this time.  We discussed with critical care who is coming to evaluate.  Repeat labs ordered.  Repeat blood work shows improvement in creatinine, potassium is stable at 5.6.  I did order insulin dextrose for temporization.  He has received Lasix.  Repeat blood gases pending.  Discussed with Dr. Lonzo Candy with critical care who evaluated patient.  He will follow-up on blood gas.  Appreciate critical care recommendations.     Laurence Spates, MD 07/24/23 [phone]

## 2023-07-25 ENCOUNTER — Inpatient Hospital Stay (HOSPITAL_COMMUNITY): Payer: Self-pay

## 2023-07-25 ENCOUNTER — Inpatient Hospital Stay (HOSPITAL_COMMUNITY): Payer: Medicare HMO

## 2023-07-25 DIAGNOSIS — R7989 Other specified abnormal findings of blood chemistry: Secondary | ICD-10-CM

## 2023-07-25 DIAGNOSIS — I5021 Acute systolic (congestive) heart failure: Secondary | ICD-10-CM

## 2023-07-25 DIAGNOSIS — J9601 Acute respiratory failure with hypoxia: Secondary | ICD-10-CM

## 2023-07-25 DIAGNOSIS — I472 Ventricular tachycardia, unspecified: Secondary | ICD-10-CM | POA: Diagnosis not present

## 2023-07-25 DIAGNOSIS — E875 Hyperkalemia: Secondary | ICD-10-CM

## 2023-07-25 DIAGNOSIS — I5082 Biventricular heart failure: Secondary | ICD-10-CM

## 2023-07-25 DIAGNOSIS — J9622 Acute and chronic respiratory failure with hypercapnia: Secondary | ICD-10-CM | POA: Diagnosis not present

## 2023-07-25 DIAGNOSIS — N179 Acute kidney failure, unspecified: Secondary | ICD-10-CM | POA: Diagnosis not present

## 2023-07-25 LAB — GLUCOSE, CAPILLARY
Glucose-Capillary: 116 mg/dL — ABNORMAL HIGH (ref 70–99)
Glucose-Capillary: 88 mg/dL (ref 70–99)
Glucose-Capillary: 77 mg/dL (ref 70–99)
Glucose-Capillary: 87 mg/dL (ref 70–99)
Glucose-Capillary: 105 mg/dL — ABNORMAL HIGH (ref 70–99)

## 2023-07-25 LAB — COMPREHENSIVE METABOLIC PANEL
ALT: 490 U/L — ABNORMAL HIGH (ref 0–44)
AST: 252 U/L — ABNORMAL HIGH (ref 15–41)
Albumin: 3.6 g/dL (ref 3.5–5.0)
Alkaline Phosphatase: 159 U/L — ABNORMAL HIGH (ref 38–126)
Anion gap: 11 (ref 5–15)
BUN: 90 mg/dL — ABNORMAL HIGH (ref 8–23)
CO2: 31 mmol/L (ref 22–32)
Calcium: 8.2 mg/dL — ABNORMAL LOW (ref 8.9–10.3)
Chloride: 91 mmol/L — ABNORMAL LOW (ref 98–111)
Creatinine, Ser: 2.77 mg/dL — ABNORMAL HIGH (ref 0.61–1.24)
GFR, Estimated: 24 mL/min — ABNORMAL LOW (ref >60)
Glucose, Bld: 99 mg/dL (ref 70–99)
Potassium: 5.3 mmol/L — ABNORMAL HIGH (ref 3.5–5.1)
Sodium: 133 mmol/L — ABNORMAL LOW (ref 135–145)
Total Bilirubin: 3.5 mg/dL — ABNORMAL HIGH (ref <1.2)
Total Protein: 7.2 g/dL (ref 6.5–8.1)

## 2023-07-25 LAB — BLOOD GAS, ARTERIAL
Acid-Base Excess: 2.8 mmol/L — ABNORMAL HIGH (ref 0.0–2.0)
Acid-Base Excess: 7.6 mmol/L — ABNORMAL HIGH (ref 0.0–2.0)
Bicarbonate: 31.3 mmol/L — ABNORMAL HIGH (ref 20.0–28.0)
Bicarbonate: 33.3 mmol/L — ABNORMAL HIGH (ref 20.0–28.0)
Drawn by: 23532
Drawn by: 33147
FIO2: 0.6 %
FIO2: 40 %
MECHVT: 500 mL
MECHVT: 500 mL
O2 Saturation: 99.1 %
O2 Saturation: 97 %
PEEP: 8 cmH2O
PEEP: 5 cmH2O
Patient temperature: 37
Patient temperature: 37
RATE: 20 {breaths}/min
RATE: 20 {breaths}/min
pCO2 arterial: 65 mm[Hg] — ABNORMAL HIGH (ref 32–48)
pCO2 arterial: 49 mm[Hg] — ABNORMAL HIGH (ref 32–48)
pH, Arterial: 7.29 — ABNORMAL LOW (ref 7.35–7.45)
pH, Arterial: 7.44 (ref 7.35–7.45)
pO2, Arterial: 114 mm[Hg] — ABNORMAL HIGH (ref 83–108)
pO2, Arterial: 78 mm[Hg] — ABNORMAL LOW (ref 83–108)

## 2023-07-25 LAB — ECHOCARDIOGRAM COMPLETE
Area-P 1/2: 3.65 cm2
Calc EF: 20.4 %
Est EF: <20
Height: 67.008 in
S' Lateral: 4.7 cm
Single Plane A2C EF: 15.4 %
Single Plane A4C EF: 28.3 %
Weight: 7280.47 [oz_av]

## 2023-07-25 LAB — CBC
HCT: 48.7 % (ref 39.0–52.0)
Hemoglobin: 15.2 g/dL (ref 13.0–17.0)
MCH: 26.4 pg (ref 26.0–34.0)
MCHC: 31.2 g/dL (ref 30.0–36.0)
MCV: 84.5 fL (ref 80.0–100.0)
Platelets: 158 10*3/uL (ref 150–400)
RBC: 5.76 MIL/uL (ref 4.22–5.81)
RDW: 18.2 % — ABNORMAL HIGH (ref 11.5–15.5)
WBC: 8.2 10*3/uL (ref 4.0–10.5)
nRBC: 2 % — ABNORMAL HIGH (ref 0.0–0.2)

## 2023-07-25 LAB — ACETAMINOPHEN LEVEL: Acetaminophen (Tylenol), Serum: <10 ug/mL — ABNORMAL LOW (ref 10–30)

## 2023-07-25 LAB — PREALBUMIN: Prealbumin: 8 mg/dL — ABNORMAL LOW (ref 18–38)

## 2023-07-25 LAB — T4, FREE: Free T4: 0.97 ng/dL (ref 0.61–1.12)

## 2023-07-25 LAB — HEPATITIS PANEL, ACUTE
HCV Ab: NONREACTIVE
Hep A IgM: NONREACTIVE
Hep B C IgM: NONREACTIVE
Hepatitis B Surface Ag: NONREACTIVE

## 2023-07-25 LAB — CBG MONITORING, ED
Glucose-Capillary: 113 mg/dL — ABNORMAL HIGH (ref 70–99)
Glucose-Capillary: 95 mg/dL (ref 70–99)

## 2023-07-25 LAB — LIPID PANEL
Cholesterol: 127 mg/dL (ref 0–200)
HDL: 15 mg/dL — ABNORMAL LOW (ref >40)
LDL Cholesterol: 87 mg/dL (ref 0–99)
Total CHOL/HDL Ratio: 8.5 {ratio}
Triglycerides: 126 mg/dL (ref <150)
VLDL: 25 mg/dL (ref 0–40)

## 2023-07-25 LAB — TSH: TSH: 3.536 u[IU]/mL (ref 0.350–4.500)

## 2023-07-25 LAB — RESPIRATORY PANEL BY PCR

## 2023-07-25 LAB — COOXEMETRY PANEL
Carboxyhemoglobin: 2.7 % — ABNORMAL HIGH (ref 0.5–1.5)
Methemoglobin: <0.7 % (ref 0.0–1.5)
O2 Saturation: 80.4 %
Total hemoglobin: 16.1 g/dL — ABNORMAL HIGH (ref 12.0–16.0)

## 2023-07-25 LAB — TROPONIN I (HIGH SENSITIVITY)
Troponin I (High Sensitivity): 156 ng/L (ref <18)
Troponin I (High Sensitivity): 371 ng/L (ref <18)
Troponin I (High Sensitivity): 302 ng/L (ref <18)

## 2023-07-25 LAB — HEMOGLOBIN A1C
Hgb A1c MFr Bld: 7.5 % — ABNORMAL HIGH (ref 4.8–5.6)
Mean Plasma Glucose: 168.55 mg/dL

## 2023-07-25 LAB — HIV ANTIBODY (ROUTINE TESTING W REFLEX): HIV Screen 4th Generation wRfx: NONREACTIVE

## 2023-07-25 LAB — PROCALCITONIN: Procalcitonin: 0.54 ng/mL

## 2023-07-25 LAB — LACTIC ACID, PLASMA
Lactic Acid, Venous: 1.6 mmol/L (ref 0.5–1.9)
Lactic Acid, Venous: 1.3 mmol/L (ref 0.5–1.9)

## 2023-07-25 LAB — MRSA NEXT GEN BY PCR, NASAL: MRSA by PCR Next Gen: NOT DETECTED

## 2023-07-25 LAB — OSMOLALITY, URINE: Osmolality, Ur: 361 mosm/kg (ref 300–900)

## 2023-07-25 LAB — MAGNESIUM: Magnesium: 2.5 mg/dL — ABNORMAL HIGH (ref 1.7–2.4)

## 2023-07-25 MED ORDER — CHLORHEXIDINE GLUCONATE CLOTH 2 % EX PADS
6.0000 | MEDICATED_PAD | Freq: Every day | CUTANEOUS | Status: DC
Start: 2023-07-25 — End: 2023-07-29
  Administered 2023-07-25 – 2023-07-28 (×4): 6 via TOPICAL

## 2023-07-25 MED ORDER — FUROSEMIDE 10 MG/ML IJ SOLN
80.0000 mg | Freq: Two times a day (BID) | INTRAMUSCULAR | Status: DC
  Administered 2023-07-25 – 2023-07-27 (×4): 80 mg via INTRAVENOUS
  Filled 2023-07-25 (×4): qty 8

## 2023-07-25 MED ORDER — PERFLUTREN LIPID MICROSPHERE
1.0000 mL | INTRAVENOUS | Status: AC | PRN
  Administered 2023-07-25: 2 mL via INTRAVENOUS

## 2023-07-25 MED ORDER — DEXMEDETOMIDINE HCL IN NACL 400 MCG/100ML IV SOLN
0.0000 ug/kg/h | INTRAVENOUS | Status: DC
  Administered 2023-07-25: 1.2 ug/kg/h via INTRAVENOUS
  Administered 2023-07-25: 0.6 ug/kg/h via INTRAVENOUS
  Administered 2023-07-25: 1.5 ug/kg/h via INTRAVENOUS
  Administered 2023-07-25: 0.7 ug/kg/h via INTRAVENOUS
  Administered 2023-07-25 (×2): 1.1 ug/kg/h via INTRAVENOUS
  Administered 2023-07-25: 0.6 ug/kg/h via INTRAVENOUS
  Administered 2023-07-26: 0.7 ug/kg/h via INTRAVENOUS
  Administered 2023-07-26: 0.8 ug/kg/h via INTRAVENOUS
  Administered 2023-07-26: 0.7 ug/kg/h via INTRAVENOUS
  Administered 2023-07-26: 0.9 ug/kg/h via INTRAVENOUS
  Administered 2023-07-26: 0.7 ug/kg/h via INTRAVENOUS
  Administered 2023-07-26: 0.8 ug/kg/h via INTRAVENOUS
  Administered 2023-07-26 (×2): 0.7 ug/kg/h via INTRAVENOUS
  Administered 2023-07-27 (×3): 0.9 ug/kg/h via INTRAVENOUS
  Administered 2023-07-27 (×2): 0.8 ug/kg/h via INTRAVENOUS
  Administered 2023-07-27 (×2): 0.9 ug/kg/h via INTRAVENOUS
  Administered 2023-07-27: 0.8 ug/kg/h via INTRAVENOUS
  Administered 2023-07-27 (×4): 0.9 ug/kg/h via INTRAVENOUS
  Administered 2023-07-28: 1.1 ug/kg/h via INTRAVENOUS
  Administered 2023-07-28: 0.9 ug/kg/h via INTRAVENOUS
  Administered 2023-07-28 (×2): 1 ug/kg/h via INTRAVENOUS
  Administered 2023-07-28: 0.9 ug/kg/h via INTRAVENOUS
  Filled 2023-07-25 (×25): qty 100
  Filled 2023-07-25: qty 200
  Filled 2023-07-25 (×6): qty 100

## 2023-07-25 MED ORDER — FENTANYL CITRATE PF 50 MCG/ML IJ SOSY
50.0000 ug | PREFILLED_SYRINGE | INTRAMUSCULAR | Status: DC | PRN
  Administered 2023-07-25 – 2023-07-30 (×18): 50 ug via INTRAVENOUS
  Filled 2023-07-25: qty 1

## 2023-07-25 MED ORDER — HEPARIN SODIUM (PORCINE) 5000 UNIT/ML IJ SOLN
5000.0000 [IU] | Freq: Three times a day (TID) | INTRAMUSCULAR | Status: DC
  Administered 2023-07-25 – 2023-07-28 (×10): 5000 [IU] via SUBCUTANEOUS
  Filled 2023-07-25 (×10): qty 1

## 2023-07-25 NOTE — Consult Note (Signed)
Cardiology Consultation   Patient ID: Levi [name] MRN: [id number]; DOB: 25-Apr-1956  Admit date: 07/24/2023 Date of Consult: 07/25/2023  PCP:  Patient, No Pcp Per   [facility] Providers Cardiologist:  None        Patient Profile:   Levi [name] is a 67 y.o. male with no known prior medical history (has not been seen doctors in years)  who is being seen 07/25/2023 for the evaluation of new onset heart failure  at the request of Dr Wynona Neat.  History of Present Illness:   Levi [name] 67 yo male little known medical history (from ER note patient reported does not see doctors) presented with SOB progressing over several weeks. +orthopnea + LE edema. In ER found to be severely volume overloaded, anasarca. Respiratory status deteriorated in ER. Initially on bipap, later required intubation. Patient currently intubated and sedated, I am not able to collect any history.    ER vitals: p 82 bp 156/91 95% on 5L  initially  Na 134 K 5.6 BUN 86 Cr 3.60  AST 475 ALT 700 Alk phos 211 Tbili 4.5 BNP 798 Lactic 1.3 Procalc 0.54 Mg 2.7 WBC 11.5 Hgb 15.6 Plt 184  Ammonic <10 UDS neg TSH 3.5  VBG 7.08 111 COVID neg flu neg RSV neg Trop 164-->169-->371-->302 EKG SR, no acute ischemic changes Echo: LVEF <20%, global hypokinesis, grade I dd, severe RV dysfunction, PASP 29 CXR limited visualizaiton, no clear acute process CT C/A/P: cardiomegaly, moderately dilated PA Abd [place]: hepatic steatosis      History reviewed. No pertinent past medical history.  History reviewed. No pertinent surgical history.    Inpatient Medications: Scheduled Meds:  aspirin EC  325 mg Oral Daily   Chlorhexidine Gluconate Cloth  6 each Topical Daily   dexmedetomidine  1 mcg/kg Intravenous Once   etomidate  20 mg Intravenous Once   furosemide  80 mg Intravenous Q12H   heparin injection (subcutaneous)  5,000 Units Subcutaneous Q8H   insulin aspart  0-9 Units Subcutaneous Q4H   midazolam  4 mg  Intravenous Once   nystatin cream   Topical TID   Continuous Infusions:  dexmedetomidine (PRECEDEX) IV infusion Stopped (07/25/23 0954)   famotidine (PEPCID) IV Stopped (07/25/23 0137)   fentaNYL infusion INTRAVENOUS Stopped (07/25/23 1240)   PRN Meds: acetaminophen, docusate sodium, fentaNYL (SUBLIMAZE) injection, ondansetron (ZOFRAN) IV, polyethylene glycol  Allergies:   No Known Allergies  Social History:   Social History   Socioeconomic History   Marital status: Single    Spouse name: Not on file   Number of children: Not on file   Years of education: Not on file   Highest education level: Not on file  Occupational History   Not on file  Tobacco Use   Smoking status: Never   Smokeless tobacco: Never  Substance and Sexual Activity   Alcohol use: Not on file   Drug use: Not on file   Sexual activity: Not on file  Other Topics Concern   Not on file  Social History Narrative   Not on file   Social Determinants of Health   Financial Resource Strain: Not on file  Food Insecurity: Not on file  Transportation Needs: Not on file  Physical Activity: Not on file  Stress: Not on file  Social Connections: Not on file  Intimate Partner Violence: Not on file    Family History:   History reviewed. No pertinent family history.   ROS:  Please see the history  of present illness.   All other ROS reviewed and negative.     Physical Exam/Data:   Vitals:   07/25/23 1145 07/25/23 1200 07/25/23 1225 07/25/23 1410  BP: 98/69 99/68    Pulse:  (!) 56    Resp:  20    Temp:   97.6 F (36.4 C)   TempSrc:   Oral   SpO2:  96%  94%  Weight:      Height:        Intake/Output Summary (Last 24 hours) at 07/25/2023 1504 Last data filed at 07/25/2023 1242 Gross per 24 hour  Intake 872.2 ml  Output 4150 ml  Net -3277.8 ml      07/25/2023    5:30 AM 07/24/2023    2:52 PM  Last 3 Weights  Weight (lbs) 455 lb 0.5 oz 455 lb  Weight (kg) 206.4 kg 206.387 kg     Body mass  index is 71.25 kg/m.  General:  sedated, intubated. Obese gentleman HEENT: normal Neck: no JVD Vascular: No carotid bruits; Distal pulses 2+ bilaterally Cardiac:  normal S1, S2; RRR; no murmur  Lungs:  crackles bilatearl bases Abd: soft, nontender, no hepatomegaly  Ext: no edema Musculoskeletal:  No deformities, BUE and BLE strength normal and equal Skin: trace bilateral edema Neuro:  CNs 2-12 intact, no focal abnormalities noted Psych:  Normal affect     Relevant CV Studies:   Laboratory Data:  High Sensitivity Troponin:   Recent Labs  Lab 07/24/23 1558 07/24/23 1815 07/24/23 2326 07/25/23 0805 07/25/23 1148  TROPONINIHS 164* 169* 156* 371* 302*     Chemistry Recent Labs  Lab 07/24/23 1558 07/24/23 2024 07/24/23 2248 07/25/23 0805  NA 134* 132* 134* 133*  K 5.6* 5.6* 5.7* 5.3*  CL 89* 90* 91* 91*  CO2 30 26  --  31  GLUCOSE 127* 126* 123* 99  BUN 86* 91* 99* 90*  CREATININE 3.60* 3.33* 3.10* 2.77*  CALCIUM 8.9 8.5*  --  8.2*  MG  --  2.7*  --  2.5*  GFRNONAA 18* 19*  --  24*  ANIONGAP 15 16*  --  11    Recent Labs  Lab 07/24/23 1558 07/24/23 2024 07/25/23 0805  PROT 8.7* 8.7* 7.2  ALBUMIN 4.4 4.4 3.6  AST 475* 426* 252*  ALT 700* 666* 490*  ALKPHOS 211* 198* 159*  BILITOT 4.5* 3.9* 3.5*   Lipids  Recent Labs  Lab 07/25/23 0805  CHOL 127  TRIG 126  HDL 15*  LDLCALC 87  CHOLHDL 8.5    Hematology Recent Labs  Lab 07/24/23 1815 07/24/23 2248 07/25/23 0805  WBC 11.5*  --  8.2  RBC 5.95*  --  5.76  HGB 15.6 20.1* 15.2  HCT 52.0 59.0* 48.7  MCV 87.4  --  84.5  MCH 26.2  --  26.4  MCHC 30.0  --  31.2  RDW 18.5*  --  18.2*  PLT 184  --  158   Thyroid  Recent Labs  Lab 07/25/23 0805  TSH 3.536  FREET4 0.97    BNP Recent Labs  Lab 07/24/23 1815  BNP 798.8*    DDimer No results for input(s): "DDIMER" in the last 168 hours.   Radiology/Studies:  ECHOCARDIOGRAM COMPLETE  Result Date: 07/25/2023    ECHOCARDIOGRAM REPORT    Patient Name:   Levi [name] Date of Exam: 07/25/2023 Medical Rec #:  [id number]     Height:       67.0 in Accession #:  [id number]    Weight:       455.0 lb Date of Birth:  1956-03-13     BSA:          2.868 m Patient Age:    67 years      BP:           111/73 mmHg Patient Gender: M             HR:           57 bpm. Exam Location:  Inpatient Procedure: 2D Echo, Cardiac Doppler, Color Doppler and Intracardiac            Opacification Agent Indications:     I50.40* Unspecified combined systolic (congestive) and                  diastolic (congestive) heart failure  History:         Patient has no prior history of Echocardiogram examinations.                  CHF and Cardiomegaly, Pulmonary HTN, Arrythmias:VT;                  Signs/Symptoms:Chest Pain, Dyspnea, Shortness of Breath and                  Edema.  Sonographer:     Sheralyn Boatman RDCS Referring Phys:  [id number] Patrici Ranks Diagnosing Phys: Lennie Odor MD  Sonographer Comments: Technically difficult study due to poor echo windows, patient is obese and echo performed with patient supine and on artificial respirator. Image acquisition challenging due to patient body habitus. Delayed for labs by RN. IMPRESSIONS  1. Left ventricular ejection fraction, by estimation, is <20%. The left ventricle has severely decreased function. The left ventricle demonstrates global hypokinesis. The left ventricular internal cavity size was mildly dilated. There is mild concentric  left ventricular hypertrophy. Left ventricular diastolic parameters are consistent with Grade I diastolic dysfunction (impaired relaxation).  2. Right ventricular systolic function is severely reduced. The right ventricular size is severely enlarged. There is normal pulmonary artery systolic pressure. The estimated right ventricular systolic pressure is 29.4 mmHg.  3. The mitral valve is grossly normal. No evidence of mitral valve regurgitation. No evidence of mitral stenosis.  4. The aortic valve is  tricuspid. Aortic valve regurgitation is trivial. No aortic stenosis is present.  5. There is mild dilatation of the ascending aorta, measuring 41 mm.  6. The inferior vena cava is dilated in size with <50% respiratory variability, suggesting right atrial pressure of 15 mmHg. Comparison(s): No prior Echocardiogram. Conclusion(s)/Recommendation(s): No left ventricular mural or apical thrombus/thrombi. FINDINGS  Left Ventricle: Left ventricular ejection fraction, by estimation, is <20%. The left ventricle has severely decreased function. The left ventricle demonstrates global hypokinesis. Definity contrast agent was given IV to delineate the left ventricular endocardial borders. The left ventricular internal cavity size was mildly dilated. There is mild concentric left ventricular hypertrophy. Left ventricular diastolic parameters are consistent with Grade I diastolic dysfunction (impaired relaxation). Right Ventricle: The right ventricular size is severely enlarged. No increase in right ventricular wall thickness. Right ventricular systolic function is severely reduced. There is normal pulmonary artery systolic pressure. The tricuspid regurgitant velocity is 1.90 m/s, and with an assumed right atrial pressure of 15 mmHg, the estimated right ventricular systolic pressure is 29.4 mmHg. Left Atrium: Left atrial size was normal in size. Right Atrium: Right atrial size was normal in size. Pericardium: There is no  evidence of pericardial effusion. Mitral Valve: The mitral valve is grossly normal. No evidence of mitral valve regurgitation. No evidence of mitral valve stenosis. Tricuspid Valve: The tricuspid valve is grossly normal. Tricuspid valve regurgitation is mild . No evidence of tricuspid stenosis. Aortic Valve: The aortic valve is tricuspid. Aortic valve regurgitation is trivial. No aortic stenosis is present. Pulmonic Valve: The pulmonic valve was grossly normal. Pulmonic valve regurgitation is not visualized. No  evidence of pulmonic stenosis. Aorta: The aortic root is normal in size and structure. There is mild dilatation of the ascending aorta, measuring 41 mm. Venous: The inferior vena cava is dilated in size with less than 50% respiratory variability, suggesting right atrial pressure of 15 mmHg. IAS/Shunts: The atrial septum is grossly normal.  LEFT VENTRICLE PLAX 2D LVIDd:         5.70 cm      Diastology LVIDs:         4.70 cm      LV e' medial:    3.15 cm/s LV PW:         1.20 cm      LV E/e' medial:  9.1 LV IVS:        1.10 cm      LV e' lateral:   3.15 cm/s LVOT diam:     2.40 cm      LV E/e' lateral: 9.1 LV SV:         61 LV SV Index:   21 LVOT Area:     4.52 cm  LV Volumes (MOD) LV vol d, MOD A2C: 136.0 ml LV vol d, MOD A4C: 151.5 ml LV vol s, MOD A2C: 115.0 ml LV vol s, MOD A4C: 108.7 ml LV SV MOD A2C:     21.0 ml LV SV MOD A4C:     151.5 ml LV SV MOD BP:      29.3 ml RIGHT VENTRICLE            IVC RV S prime:     4.03 cm/s  IVC diam: 3.10 cm TAPSE (M-mode): 0.9 cm LEFT ATRIUM             Index        RIGHT ATRIUM           Index LA diam:        3.90 cm 1.36 cm/m   RA Area:     22.90 cm LA Vol (A2C):   43.5 ml 15.17 ml/m  RA Volume:   77.40 ml  26.99 ml/m LA Vol (A4C):   69.2 ml 24.13 ml/m LA Biplane Vol: 56.0 ml 19.53 ml/m  AORTIC VALVE LVOT Vmax:   70.70 cm/s LVOT Vmean:  41.100 cm/s LVOT VTI:    0.134 m  AORTA Ao Root diam: 3.50 cm Ao Asc diam:  4.10 cm MITRAL VALVE               TRICUSPID VALVE MV Area (PHT): 3.65 cm    TR Peak grad:   14.4 mmHg MV Decel Time: 208 msec    TR Vmax:        190.00 cm/s MV E velocity: 28.70 cm/s MV A velocity: 44.60 cm/s  SHUNTS MV E/A ratio:  0.64        Systemic VTI:  0.13 m                            Systemic Diam: 2.40 cm Lennie Odor MD  Electronically signed by Lennie Odor MD Signature Date/Time: 07/25/2023/1:28:30 PM    Final (Updated)    US Abdomen Complete  Result Date: 07/25/2023 CLINICAL DATA:  Pulmonary edema. EXAM: ABDOMEN ULTRASOUND COMPLETE  COMPARISON:  None Available. FINDINGS: Gallbladder: Limited evaluation of the gallbladder secondary to limited patient positioning. No gallstones or wall thickening visualized (4.2 mm). No sonographic Murphy sign noted by sonographer. Common bile duct: Diameter: N/A (not clearly visualized) Liver: No focal lesion identified. Diffusely increased echogenicity of the liver parenchyma is noted. Portal vein is patent on color Doppler imaging with normal direction of blood flow towards the liver. IVC: No abnormality visualized. Pancreas: Visualized portion unremarkable. Spleen: Size (12.8 cm) and appearance within normal limits. Right Kidney: Length: 9.7 cm. Echogenicity within normal limits. No mass or hydronephrosis visualized. Left Kidney: Length: 10.5 cm. Echogenicity within normal limits. No mass or hydronephrosis visualized. Abdominal aorta: No aneurysm visualized (1.7 cm in AP diameter). Other findings: It should be noted that the study is markedly limited secondary to the patient's body habitus and patient intubation. IMPRESSION: 1. Markedly limited study, as described above. 2. Hepatic steatosis without focal liver lesions. Electronically Signed   By: Aram Candela M.D.   On: 07/25/2023 02:10   DG Chest Portable 1 View  Result Date: 07/25/2023 CLINICAL DATA:  Check intubation.  ETT placement. EXAM: PORTABLE CHEST 1 VIEW COMPARISON:  Chest CT without contrast yesterday at 6:47 p.m. FINDINGS: 12:34 a.m. ETT interval insertion, the tip 3.2 cm from the carina. NGT also new, enters the stomach with the intragastric course not filmed. Also new left IJ central line the tip in the mid SVC. 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 Aortic atherosclerosis Moderate cardiomegaly. There is mild central vascular prominence without overt edema. The lungs are clear of focal infiltrates. No pleural effusion is seen. No new osseous findings. IMPRESSION: 1. ETT tip 3.2 cm from the carina. 2. NGT enters the  stomach with the intragastric course not filmed. 3. Left IJ central line tip in the mid SVC. 4. Cardiomegaly with mild central vascular prominence without overt edema. 5. Aortic atherosclerosis. Electronically Signed   By: Almira Bar M.D.   On: 07/25/2023 01:12   CT CHEST ABDOMEN PELVIS WO CONTRAST  Result Date: 07/24/2023 CLINICAL DATA:  Shortness of breath EXAM: CT CHEST, ABDOMEN AND PELVIS WITHOUT CONTRAST TECHNIQUE: Multidetector CT imaging of the chest, abdomen and pelvis was performed following the standard protocol without IV contrast. RADIATION DOSE REDUCTION: This exam was performed according to the departmental dose-optimization program which includes automated exposure control, adjustment of the mA and/or kV according to patient size and/or use of iterative reconstruction technique. COMPARISON:  None Available. FINDINGS: CT CHEST FINDINGS Cardiovascular: Cardiomegaly. No pericardial effusion. Thoracic aorta is nonaneurysmal. Atherosclerotic calcifications of the aorta and coronary arteries. Central pulmonary vasculature is moderately dilated. Mediastinum/Nodes: No enlarged mediastinal, hilar, or axillary lymph nodes. Thyroid gland, trachea, and esophagus demonstrate no significant findings. Lungs/Pleura: Mild bibasilar subsegmental atelectasis. Lungs are otherwise clear. No pleural effusion or pneumothorax. Musculoskeletal: No acute osseous abnormality. Multilevel bridging anterior endplate osteophytes throughout the thoracic spine compatible with diffuse idiopathic skeletal hyperostosis. Gynecomastia is present on the left. CT ABDOMEN PELVIS FINDINGS Hepatobiliary: Unremarkable unenhanced appearance of the liver. There may be layering sludge within the gallbladder. No pericholecystic inflammatory changes by CT. No biliary dilatation. Pancreas: Unremarkable. No pancreatic ductal dilatation or surrounding inflammatory changes. Spleen: Normal in size without focal abnormality. Adrenals/Urinary  Tract: Adrenal glands are unremarkable. Kidneys are normal,  without renal calculi, focal lesion, or hydronephrosis. Bladder is unremarkable. Stomach/Bowel: Stomach is within normal limits. Scattered colonic diverticulosis. No evidence of bowel wall thickening, distention, or inflammatory changes. Vascular/Lymphatic: Aortic atherosclerosis. No enlarged abdominal or pelvic lymph nodes. Reproductive: Prostate is unremarkable. Other: No free fluid. No abdominopelvic fluid collection. No pneumoperitoneum. No abdominal wall hernia. Musculoskeletal: Skin thickening and subcutaneous edema of the lower anterior abdominal wall/pannus. No fluid collections. No acute osseous abnormality. Appearance of the proximal right femur with coarsened trabecula suggestive of Paget's disease. IMPRESSION: 1. Skin thickening and subcutaneous edema of the lower anterior abdominal wall/pannus. Correlate for cellulitis. No fluid collections. 2. Otherwise, no acute intrathoracic or intra-abdominal findings. 3. Cardiomegaly with moderate dilatation of the central pulmonary vasculature, which can be seen in the setting of pulmonary hypertension. 4. Colonic diverticulosis without evidence of acute diverticulitis. 5. Appearance of the proximal right femur with coarsened trabecula suggestive of Paget's disease. 6. Aortic atherosclerosis (ICD10-I70.0). Electronically Signed   By: Duanne Guess D.O.   On: 07/24/2023 19:31   DG Chest 2 View  Result Date: 07/24/2023 CLINICAL DATA:  Shortness of breath. EXAM: CHEST - 2 VIEW COMPARISON:  None Available. FINDINGS: Limited exam. Low lung volume. Mild central vascular congestion, likely accentuated by low lung volume. No frank pulmonary edema. Bilateral lung fields are otherwise clear. No acute consolidation or lung collapse. Bilateral costophrenic angles are clear. Moderately enlarged cardio-mediastinal silhouette, which is accentuated by low lung volume and AP technique. No acute osseous  abnormalities. The soft tissues are within normal limits. IMPRESSION: *Limited exam.  No active cardiopulmonary disease. Correlate clinically to determine the need for additional imaging with chest CT scan. Electronically Signed   By: Jules Schick M.D.   On: 07/24/2023 15:55     Assessment and Plan:   1.Acute HFrEF/ RV failure - Echo: LVEF <20%, global hypokinesis, grade I dd, severe RV dysfunction, PASP 29 - CXR limited visualizaiton, no clear acute process. BNP 798, -lactates have been normal, MAPS 80s to 90s.   -left internal jugular in place, Check CVP, coox - received IV lasix 60mg  x 1 yesterday. Currnenly on IV 80mg  every 12 hours. Negative 3.2 L thus far. Continue IV lasix, looks to have sufficent cardiac output to diurese with diuretic alone. Cr is trending down with diuresis - would consider RHC/LHC once extubated and diuresed, more clinically stable. Unclear etiology of his HF at this time.    2.Hypercapneic/ hypoxic respiratory failure - pCO2 111 initially, marked respiraotry acidosis pH 7.08 - subsequent ABG on vent 7.29/65/114/31 - in setting of acute HF, BMI 71 so likely some OHS/restrictive lung disease as well.     3. Elevated troponin - peak trop 371 in setting of respiratory failure on ventilator, marked respiratory acidosis pH 7.08.   EKG without acute ischemic changes - at this time suspect demand ishcemia and HF as the cause, not a typical pattern for ACS. Does not require anticoagulation. Would consider cath on low EF alone however once more medically stable.   4. NSVT - occurred in ER in setting of marked hypercapnea/hypoxia - no significant recurrences  - keep K at 4, Mg at 2.    5. Renal dysfunction - unclear baseline, trending down with diuresis so likely component of venous congestion and HF as etiology - follow with further diuresis.    6. Elevated LFTs - being abdominal US - trending down with diuresis, likely secondary to congestive hepatopathy.       For questions or updates, please  contact [facility] Please consult [email or web address] for contact info under    Signed, Dina Rich, MD  07/25/2023 3:04 PM

## 2023-07-25 NOTE — TOC Progression Note (Signed)
Transition of Care [facility]) - Progression Note    Patient Details  Name: ROI HJORT MRN: [id number] Date of Birth: 12-25-1955  Transition of Care [facility] CM/SW Contact  Adrian Prows, RN Phone Number: 07/25/2023, 10:26 AM  Clinical Narrative:    Pt sedated on ventilator; no family at bedside; unable to complete TOC assessment.        Expected Discharge Plan and Services                                               Social Determinants of Health (SDOH) Interventions SDOH Screenings   Tobacco Use: Low Risk  (07/24/2023)    Readmission Risk Interventions     No data to display

## 2023-07-25 NOTE — Progress Notes (Signed)
eLink Physician-Brief Progress Note Patient Name: SAQIB SUNDE DOB: 11-01-1955 MRN: [id number]   Date of Service  07/25/2023  HPI/Events of Note  Patient admitted with acute on chronic respiratory failure requiring intubation and mechanical ventilation secondary to CHF and COPD exacerbation.  eICU Interventions  New Patient Evaluation.        Thomasene Lot Chrissa Meetze 07/25/2023, 5:52 AM

## 2023-07-25 NOTE — Procedures (Signed)
Central Venous Catheter Insertion Procedure Note  Levi [name]  [id number]  [birth year]  Date:07/25/23  Time:12:34 AM   Provider Performing:Alaysiah Browder Allen Norris   Procedure: Insertion of Non-tunneled Central Venous [phone]) with US guidance (63016)   Indication(s) Medication administration and Difficult access  Consent Risks of the procedure as well as the alternatives and risks of each were explained to the patient and/or caregiver.  Consent for the procedure was obtained and is signed in the bedside chart  Anesthesia Topical only with 1% lidocaine   Timeout Verified patient identification, verified procedure, site/side was marked, verified correct patient position, special equipment/implants available, medications/allergies/relevant history reviewed, required imaging and test results available.  Sterile Technique Maximal sterile technique including full sterile barrier drape, hand hygiene, sterile gown, sterile gloves, mask, hair covering, sterile ultrasound probe cover (if used).  Procedure Description Area of catheter insertion was cleaned with chlorhexidine and draped in sterile fashion.  With real-time ultrasound guidance a central venous catheter was placed into the left internal jugular vein. Nonpulsatile blood flow and easy flushing noted in all ports.  The catheter was sutured in place and sterile dressing applied.  Complications/Tolerance None; patient tolerated the procedure well. Chest X-ray is ordered to verify placement for internal jugular or subclavian cannulation.   Chest x-ray is not ordered for femoral cannulation.  EBL Minimal  Specimen(s) None

## 2023-07-25 NOTE — Progress Notes (Signed)
  Echocardiogram 2D Echocardiogram has been performed.  Janalyn Harder 07/25/2023, 8:47 AM

## 2023-07-25 NOTE — ED Notes (Signed)
ED TO INPATIENT HANDOFF REPORT  ED Nurse Name and Phone #: Milagros Loll & Herma Ard Name/Age/Gender Levi [name] 67 y.o. male Room/Bed: RESB/RESB  Code Status   Code Status: Full Code  Home/SNF/Other Home Patient oriented to: situation Is this baseline? Yes   Triage Complete: Triage complete  Chief Complaint CHF (congestive heart failure) (HCC) [I50.9]  Triage Note Pt BIB EMS from Doctors office due to SOB and [name] since last week. Doctor office administered 2.5 mg of Albuterol. SpO2 is 64% on RA. Pt reports he was told today that he has COPD. Lung sounds diminished.   Allergies No Known Allergies  Level of Care/Admitting Diagnosis ED Disposition     ED Disposition  Admit   Condition  --   Comment  Hospital Area: [facility] [[id number]]  Level of Care: ICU [6]  May admit patient to [facility] or [facility] if equivalent level of care is available:: Yes  Covid Evaluation: Asymptomatic - no recent exposure (last 10 days) testing not required  Diagnosis: CHF (congestive heart failure) [facility]) [[id number]]  Admitting Physician: Patrici Ranks [[id number]]  Attending Physician: Patrici Ranks [[id number]]  Certification:: I certify this patient will need inpatient services for at least 2 midnights  Expected Medical Readiness: 07/27/2023          B Medical/Surgery History History reviewed. No pertinent past medical history. History reviewed. No pertinent surgical history.   A IV Location/Drains/Wounds Patient Lines/Drains/Airways Status     Active Line/Drains/Airways     Name Placement date Placement time Site Days   Peripheral IV 07/24/23 20 G Right Antecubital 07/24/23  1546  Antecubital  1   Peripheral IV 07/24/23 20 G 1" Left Antecubital 07/24/23  2323  Antecubital  1   CVC Triple Lumen 07/25/23 Left 07/25/23  0001  -- less than 1   NG/OG Vented/Dual Lumen Left nare Marking at nare/corner of mouth 07/25/23  0018  Left nare  less than 1   Urethral  Catheter Jill Side., RN Latex 16 Fr. 07/24/23  2245  Latex  1   Airway 7.5 mm 07/24/23  2358  -- 1            Intake/Output Last 24 hours  Intake/Output Summary (Last 24 hours) at 07/25/2023 0313 Last data filed at 07/25/2023 0200 Gross per 24 hour  Intake 100 ml  Output --  Net 100 ml    Labs/Imaging Results for orders placed or performed during the hospital encounter of 07/24/23 (from the past 48 hour(s))  CBC with Differential     Status: None   Collection Time: 07/24/23  3:58 PM  Result Value Ref Range   Neutrophils Relative % NO ACID FAST BACILLI ISOLATED %   Neutro Abs Not Measured 1.7 - 7.7 K/uL   Lymphocytes Relative Not Measured %   Lymphs Abs Not Measured 0.7 - 4.0 K/uL   Monocytes Relative Not Measured %   Monocytes Absolute Not Measured 0.1 - 1.0 K/uL   Eosinophils Relative Not Measured %   Eosinophils Absolute Not Measured 0.0 - 0.5 K/uL   Basophils Relative Not Measured %   Basophils Absolute NO ACID FAST BACILLI ISOLATED 0.0 - 0.1 K/uL   Immature Granulocytes Not Measured %   Abs Immature Granulocytes Not Measured 0.00 - 0.07 K/uL    Comment: Performed at [facility], [address] [place], [place] [address]  Troponin I (High Sensitivity)     Status: Abnormal   Collection Time:  07/24/23  3:58 PM  Result Value Ref Range   Troponin I (High Sensitivity) 164 (HH) <18 ng/L    Comment: CRITICAL RESULT CALLED TO, READ BACK BY AND VERIFIED WITH: NELSON, J @ 1757 ON 07/24/23 CAL (NOTE) Elevated high sensitivity troponin I (hsTnI) values and significant  changes across serial measurements may suggest ACS but many other  chronic and acute conditions are known to elevate hsTnI results.  Refer to the Links section for chest pain algorithms and additional  guidance. Performed at [facility], [address] [place], [place] [address]   Comprehensive metabolic panel     Status: Abnormal   Collection Time: 07/24/23  3:58 PM   Result Value Ref Range   Sodium 134 (L) 135 - 145 mmol/L   Potassium 5.6 (H) 3.5 - 5.1 mmol/L   Chloride 89 (L) 98 - 111 mmol/L   CO2 30 22 - 32 mmol/L   Glucose, Bld 127 (H) 70 - 99 mg/dL    Comment: Glucose reference range applies only to samples taken after fasting for at least 8 hours.   BUN 86 (H) 8 - 23 mg/dL   Creatinine, Ser 9.56 (H) 0.61 - 1.24 mg/dL   Calcium 8.9 8.9 - 38.7 mg/dL   Total Protein 8.7 (H) 6.5 - 8.1 g/dL   Albumin 4.4 3.5 - 5.0 g/dL   AST 564 (H) 15 - 41 U/L   ALT 700 (H) 0 - 44 U/L   Alkaline Phosphatase 211 (H) 38 - 126 U/L   Total Bilirubin 4.5 (H) <1.2 mg/dL   GFR, Estimated 18 (L) >60 mL/min    Comment: (NOTE) Calculated using the CKD-EPI Creatinine Equation (2021)    Anion gap 15 5 - 15    Comment: Performed at [facility], [address] [place], [place] [address]  Resp panel by RT-PCR (RSV, Flu A&B, Covid) Anterior Nasal Swab     Status: None   Collection Time: 07/24/23  4:15 PM   Specimen: Anterior Nasal Swab  Result Value Ref Range   SARS Coronavirus 2 by RT PCR NEGATIVE NEGATIVE    Comment: (NOTE) SARS-CoV-2 target nucleic acids are NOT DETECTED.  The SARS-CoV-2 RNA is generally detectable in upper respiratory specimens during the acute phase of infection. The lowest concentration of SARS-CoV-2 viral copies this assay can detect is 138 copies/mL. A negative result does not preclude SARS-Cov-2 infection and should not be used as the sole basis for treatment or other patient management decisions. A negative result may occur with  improper specimen collection/handling, submission of specimen other than nasopharyngeal swab, presence of viral mutation(s) within the areas targeted by this assay, and inadequate number of viral copies(<138 copies/mL). A negative result must be combined with clinical observations, patient history, and epidemiological information. The expected result is Negative.  Fact Sheet for Patients:   BloggerCourse.com  Fact Sheet for Healthcare Providers:  SeriousBroker.it  This test is no t yet approved or cleared by the [place] FDA and  has been authorized for detection and/or diagnosis of SARS-CoV-2 by FDA under an Emergency Use Authorization (EUA). This EUA will remain  in effect (meaning this test can be used) for the duration of the COVID-19 declaration under Section 564(b)(1) of the Act, 21 U.S.C.section 360bbb-3(b)(1), unless the authorization is terminated  or revoked sooner.       Influenza A by PCR NEGATIVE NEGATIVE   Influenza B by PCR NEGATIVE NEGATIVE    Comment: (NOTE) The Xpert Xpress SARS-CoV-2/FLU/RSV  plus assay is intended as an aid in the diagnosis of influenza from Nasopharyngeal swab specimens and should not be used as a sole basis for treatment. Nasal washings and aspirates are unacceptable for Xpert Xpress SARS-CoV-2/FLU/RSV testing.  Fact Sheet for Patients: BloggerCourse.com  Fact Sheet for Healthcare Providers: SeriousBroker.it  This test is not yet approved or cleared by the [place] FDA and has been authorized for detection and/or diagnosis of SARS-CoV-2 by FDA under an Emergency Use Authorization (EUA). This EUA will remain in effect (meaning this test can be used) for the duration of the COVID-19 declaration under Section 564(b)(1) of the Act, 21 U.S.C. section 360bbb-3(b)(1), unless the authorization is terminated or revoked.     Resp Syncytial Virus by PCR NEGATIVE NEGATIVE    Comment: (NOTE) Fact Sheet for Patients: BloggerCourse.com  Fact Sheet for Healthcare Providers: SeriousBroker.it  This test is not yet approved or cleared by the [place] FDA and has been authorized for detection and/or diagnosis of SARS-CoV-2 by FDA under an Emergency Use Authorization (EUA).  This EUA will remain in effect (meaning this test can be used) for the duration of the COVID-19 declaration under Section 564(b)(1) of the Act, 21 U.S.C. section 360bbb-3(b)(1), unless the authorization is terminated or revoked.  Performed at [facility] [place], [address] [place], [place] [address]   CBC with Differential/Platelet     Status: Abnormal   Collection Time: 07/24/23  6:15 PM  Result Value Ref Range   WBC 11.5 (H) 4.0 - 10.5 K/uL   RBC 5.95 (H) 4.22 - 5.81 MIL/uL   Hemoglobin 15.6 13.0 - 17.0 g/dL   HCT 60.4 54.0 - 98.1 %   MCV 87.4 80.0 - 100.0 fL   MCH 26.2 26.0 - 34.0 pg   MCHC 30.0 30.0 - 36.0 g/dL   RDW 19.1 (H) 47.8 - 29.5 %   Platelets 184 150 - 400 K/uL   nRBC 1.7 (H) 0.0 - 0.2 %   Neutrophils Relative % 77 %   Neutro Abs 8.8 (H) 1.7 - 7.7 K/uL   Lymphocytes Relative 8 %   Lymphs Abs 0.9 0.7 - 4.0 K/uL   Monocytes Relative 9 %   Monocytes Absolute 1.0 0.1 - 1.0 K/uL   Eosinophils Relative 0 %   Eosinophils Absolute 0.0 0.0 - 0.5 K/uL   Basophils Relative 0 %   Basophils Absolute 0.1 0.0 - 0.1 K/uL   Immature Granulocytes 6 %   Abs Immature Granulocytes 0.68 (H) 0.00 - 0.07 K/uL    Comment: Performed at [facility], [address], [place], [place] [address]  Brain natriuretic peptide     Status: Abnormal   Collection Time: 07/24/23  6:15 PM  Result Value Ref Range   B Natriuretic Peptide 798.8 (H) 0.0 - 100.0 pg/mL    Comment: Performed at [facility], [address] [place], [place] [address]  Troponin I (High Sensitivity)     Status: Abnormal   Collection Time: 07/24/23  6:15 PM  Result Value Ref Range   Troponin I (High Sensitivity) 169 (HH) <18 ng/L    Comment: CRITICAL RESULT CALLED TO, READ BACK BY AND VERIFIED WITH LEWIS M. @ 1952 07/24/23 MCLEAN K. (NOTE) Elevated high sensitivity troponin I (hsTnI) values and significant  changes across serial measurements may suggest ACS but many  other  chronic and acute conditions are known to elevate hsTnI results.  Refer to the "Links" section for chest pain algorithms and additional  guidance. Performed at [facility], [address] [place], [place] [address]   I-Stat CG4 Lactic Acid     Status: None   Collection Time: 07/24/23  6:22 PM  Result Value Ref Range   Lactic Acid, Venous 1.3 0.5 - 1.9 mmol/L  CK     Status: None   Collection Time: 07/24/23  8:24 PM  Result Value Ref Range   Total CK 396 49 - 397 U/L    Comment: Performed at [facility], [address] [place], [place] [address]  Procalcitonin     Status: None   Collection Time: 07/24/23  8:24 PM  Result Value Ref Range   Procalcitonin 0.54 ng/mL    Comment:        Interpretation: PCT > 0.5 ng/mL and <= 2 ng/mL: Systemic infection (sepsis) is possible, but other conditions are known to elevate PCT as well. (NOTE)       Sepsis PCT Algorithm           Lower Respiratory Tract                                      Infection PCT Algorithm    ----------------------------     ----------------------------         PCT < 0.25 ng/mL                PCT < 0.10 ng/mL          Strongly encourage             Strongly discourage   discontinuation of antibiotics    initiation of antibiotics    ----------------------------     -----------------------------       PCT 0.25 - 0.50 ng/mL            PCT 0.10 - 0.25 ng/mL               OR       >80% decrease in PCT            Discourage initiation of                                            antibiotics      Encourage discontinuation           of antibiotics    ----------------------------     -----------------------------         PCT >= 0.50 ng/mL              PCT 0.26 - 0.50 ng/mL                AND       <80% decrease in PCT             Encourage initiation of                                             antibiotics       Encourage continuation           of antibiotics     ----------------------------     -----------------------------        PCT >= 0.50 ng/mL  PCT > 0.50 ng/mL               AND         increase in PCT                  Strongly encourage                                      initiation of antibiotics    Strongly encourage escalation           of antibiotics                                     -----------------------------                                           PCT <= 0.25 ng/mL                                                 OR                                        > 80% decrease in PCT                                      Discontinue / Do not initiate                                             antibiotics  Performed at [facility], [address] [place], [place] [address]   Phosphorus     Status: Abnormal   Collection Time: 07/24/23  8:24 PM  Result Value Ref Range   Phosphorus 7.6 (H) 2.5 - 4.6 mg/dL    Comment: Performed at [facility], [address], [place], [place] [address]  Magnesium     Status: Abnormal   Collection Time: 07/24/23  8:24 PM  Result Value Ref Range   Magnesium 2.7 (H) 1.7 - 2.4 mg/dL    Comment: Performed at [facility], [address], [place], [place] [address]  Comprehensive metabolic panel     Status: Abnormal   Collection Time: 07/24/23  8:24 PM  Result Value Ref Range   Sodium 132 (L) 135 - 145 mmol/L   Potassium 5.6 (H) 3.5 - 5.1 mmol/L   Chloride 90 (L) 98 - 111 mmol/L   CO2 26 22 - 32 mmol/L   Glucose, Bld 126 (H) 70 - 99 mg/dL    Comment: Glucose reference range applies only to samples taken after fasting for at least 8 hours.   BUN 91 (H) 8 - 23 mg/dL   Creatinine, Ser 5.28 (H) 0.61 - 1.24 mg/dL   Calcium 8.5 (L) 8.9 - 10.3 mg/dL   Total Protein 8.7 (H)  6.5 - 8.1 g/dL   Albumin 4.4 3.5 - 5.0 g/dL   AST 578 (H) 15 - 41 U/L   ALT 666 (H) 0 - 44 U/L   Alkaline Phosphatase 198 (H) 38 - 126 U/L   Total Bilirubin  3.9 (H) <1.2 mg/dL   GFR, Estimated 19 (L) >60 mL/min    Comment: (NOTE) Calculated using the CKD-EPI Creatinine Equation (2021)    Anion gap 16 (H) 5 - 15    Comment: Performed at [facility], [address], [place], [place] [address]  Urinalysis, Complete w Microscopic -Urine, Clean Catch     Status: Abnormal   Collection Time: 07/24/23  8:31 PM  Result Value Ref Range   Color, Urine AMBER (A) YELLOW    Comment: BIOCHEMICALS MAY BE AFFECTED BY COLOR   APPearance CLOUDY (A) CLEAR   Specific Gravity, Urine 1.014 1.005 - 1.030   pH 5.0 5.0 - 8.0   Glucose, UA NEGATIVE NEGATIVE mg/dL   Hgb urine dipstick MODERATE (A) NEGATIVE   Bilirubin Urine NEGATIVE NEGATIVE   Ketones, ur NEGATIVE NEGATIVE mg/dL   Protein, ur 30 (A) NEGATIVE mg/dL   Nitrite NEGATIVE NEGATIVE   Leukocytes,Ua LARGE (A) NEGATIVE   RBC / HPF 21-50 0 - 5 RBC/hpf   WBC, UA >50 0 - 5 WBC/hpf   Bacteria, UA MANY (A) NONE SEEN   Squamous Epithelial / HPF 6-10 0 - 5 /HPF   WBC Clumps PRESENT    Mucus PRESENT    Budding Yeast PRESENT    Hyaline Casts, UA PRESENT     Comment: Performed at [facility], [address] [place], [place] [address]  Creatinine, urine, random     Status: None   Collection Time: 07/24/23  8:35 PM  Result Value Ref Range   Creatinine, Urine 300 mg/dL    Comment: Performed at [facility], [address] [place], [place] [address]  Osmolality, urine     Status: None   Collection Time: 07/24/23  8:35 PM  Result Value Ref Range   Osmolality, Ur 361 300 - 900 mOsm/kg    Comment: Performed at [facility], [address], [place], [place] [address]  Ammonia     Status: None   Collection Time: 07/24/23  8:35 PM  Result Value Ref Range   Ammonia <10 9 - 35 umol/L    Comment: Performed at [facility], [address], [place], [place] [address]  Rapid urine drug screen (hospital performed)     Status: None    Collection Time: 07/24/23  8:35 PM  Result Value Ref Range   Opiates NONE DETECTED NONE DETECTED   Cocaine NONE DETECTED NONE DETECTED   Benzodiazepines NONE DETECTED NONE DETECTED   Amphetamines NONE DETECTED NONE DETECTED   Tetrahydrocannabinol NONE DETECTED NONE DETECTED   Barbiturates NONE DETECTED NONE DETECTED    Comment: (NOTE) DRUG SCREEN FOR MEDICAL PURPOSES ONLY.  IF CONFIRMATION IS NEEDED FOR ANY PURPOSE, NOTIFY LAB WITHIN 5 DAYS.  LOWEST DETECTABLE LIMITS FOR URINE DRUG SCREEN Drug Class                     Cutoff (ng/mL) Amphetamine and metabolites    1000 Barbiturate and metabolites    200 Benzodiazepine                 200 Opiates and metabolites        300 Cocaine and metabolites  300 THC                            50 Performed at [facility], [address], [place], [place] [address]   Blood gas, venous     Status: Abnormal   Collection Time: 07/24/23  8:40 PM  Result Value Ref Range   pH, Ven 7.08 (LL) 7.25 - 7.43    Comment: CRITICAL RESULT CALLED TO, READ BACK BY AND VERIFIED WITH: LEWIS N. @ 2102 07/24/23 MCLEAN    pCO2, Ven 111 (HH) 44 - 60 mmHg    Comment: CRITICAL RESULT CALLED TO, READ BACK BY AND VERIFIED WITH: LEWIS N. @ 2102 07/24/23 MCLEAN K.    pO2, Ven 52 (H) 32 - 45 mmHg   Bicarbonate 32.9 (H) 20.0 - 28.0 mmol/L   Acid-base deficit 0.5 0.0 - 2.0 mmol/L   O2 Saturation 73.1 %   Patient temperature 37.0     Comment: Performed at [facility], [address], [place], [place] [address]  Respiratory (~20 pathogens) panel by PCR     Status: None   Collection Time: 07/24/23  8:44 PM   Specimen: Nasopharyngeal Swab; Respiratory  Result Value Ref Range   Adenovirus NOT DETECTED NOT DETECTED   Coronavirus 229E NOT DETECTED NOT DETECTED    Comment: (NOTE) The Coronavirus on the Respiratory Panel, DOES NOT test for the novel  Coronavirus (2019 nCoV)    Coronavirus HKU1 NOT DETECTED NOT DETECTED    Coronavirus NL63 NOT DETECTED NOT DETECTED   Coronavirus OC43 NOT DETECTED NOT DETECTED   Metapneumovirus NOT DETECTED NOT DETECTED   Rhinovirus / Enterovirus NOT DETECTED NOT DETECTED   Influenza A NOT DETECTED NOT DETECTED   Influenza B NOT DETECTED NOT DETECTED   Parainfluenza Virus 1 NOT DETECTED NOT DETECTED   Parainfluenza Virus 2 NOT DETECTED NOT DETECTED   Parainfluenza Virus 3 NOT DETECTED NOT DETECTED   Parainfluenza Virus 4 NOT DETECTED NOT DETECTED   Respiratory Syncytial Virus NOT DETECTED NOT DETECTED   Bordetella pertussis NOT DETECTED NOT DETECTED   Bordetella Parapertussis NOT DETECTED NOT DETECTED   Chlamydophila pneumoniae NOT DETECTED NOT DETECTED   Mycoplasma pneumoniae NOT DETECTED NOT DETECTED    Comment: Performed at [facility], [address] [place], [place] [address]  I-Stat CG4 Lactic Acid     Status: None   Collection Time: 07/24/23  8:49 PM  Result Value Ref Range   Lactic Acid, Venous 1.5 0.5 - 1.9 mmol/L  Protime-INR     Status: Abnormal   Collection Time: 07/24/23  8:54 PM  Result Value Ref Range   Prothrombin Time 18.3 (H) 11.4 - 15.2 seconds   INR 1.5 (H) 0.8 - 1.2    Comment: (NOTE) INR goal varies based on device and disease states. Performed at [facility], [address] [place], [place] [address]   APTT     Status: None   Collection Time: 07/24/23  8:54 PM  Result Value Ref Range   aPTT 31 24 - 36 seconds    Comment: Performed at [facility], [address], [place], [place] [address]  Acetaminophen level     Status: Abnormal   Collection Time: 07/24/23 10:41 PM  Result Value Ref Range   Acetaminophen (Tylenol), Serum <10 (L) 10 - 30 ug/mL    Comment: (NOTE) Therapeutic concentrations vary significantly. A range of 10-30 ug/mL  may be an effective concentration for many patients. However, some  are best treated at concentrations outside of this range. Acetaminophen concentrations >150  ug/mL at 4 hours after ingestion  and >50 ug/mL at 12 hours after ingestion are often associated with  toxic reactions.  Performed at [facility], [address] [place], [place] [address]   Blood gas, venous     Status: Abnormal   Collection Time: 07/24/23 10:41 PM  Result Value Ref Range   pH, Ven 7.09 (LL) 7.25 - 7.43    Comment: CRITICAL RESULT CALLED TO, READ BACK BY AND VERIFIED WITH: BLANCHE J @2325  07/24/23 MCLEAN K.    pCO2, Ven 113 (HH) 44 - 60 mmHg    Comment: CRITICAL RESULT CALLED TO, READ BACK BY AND VERIFIED WITH: BLANCHE J @2325  07/24/23 MCLEAN K.    pO2, Ven 36 32 - 45 mmHg   Bicarbonate 34.3 (H) 20.0 - 28.0 mmol/L   Acid-Base Excess 0.9 0.0 - 2.0 mmol/L   O2 Saturation 45.3 %   Patient temperature 37.0     Comment: Performed at [facility], [address] [place], [place] [address]  I-stat chem 8, ED (not at [facility] or [facility])     Status: Abnormal   Collection Time: 07/24/23 10:48 PM  Result Value Ref Range   Sodium 134 (L) 135 - 145 mmol/L   Potassium 5.7 (H) 3.5 - 5.1 mmol/L   Chloride 91 (L) 98 - 111 mmol/L   BUN 99 (H) 8 - 23 mg/dL   Creatinine, Ser 5.28 (H) 0.61 - 1.24 mg/dL   Glucose, Bld 413 (H) 70 - 99 mg/dL    Comment: Glucose reference range applies only to samples taken after fasting for at least 8 hours.   Calcium, Ion 1.09 (L) 1.15 - 1.40 mmol/L   TCO2 33 (H) 22 - 32 mmol/L   Hemoglobin 20.1 (H) 13.0 - 17.0 g/dL   HCT 24.4 (H) 01.0 - 27.2 %  Lactic acid, plasma     Status: None   Collection Time: 07/24/23 11:25 PM  Result Value Ref Range   Lactic Acid, Venous 1.6 0.5 - 1.9 mmol/L    Comment: Performed at [facility], [address], [place], [place] [address]  Troponin I (High Sensitivity)     Status: Abnormal   Collection Time: 07/24/23 11:26 PM  Result Value Ref Range   Troponin I (High Sensitivity) 156 (HH) <18 ng/L    Comment: CRITICAL RESULT CALLED TO, READ BACK BY AND  VERIFIED WITH BLACK M. @ 0056 07/25/23 MCLEAN K. (NOTE) Elevated high sensitivity troponin I (hsTnI) values and significant  changes across serial measurements may suggest ACS but many other  chronic and acute conditions are known to elevate hsTnI results.  Refer to the "Links" section for chest pain algorithms and additional  guidance. Performed at [facility], [address] [place], [place] [address]   CBG monitoring, ED     Status: Abnormal   Collection Time: 07/24/23 11:46 PM  Result Value Ref Range   Glucose-Capillary 119 (H) 70 - 99 mg/dL    Comment: Glucose reference range applies only to samples taken after fasting for at least 8 hours.  Blood gas, arterial     Status: Abnormal   Collection Time: 07/25/23  1:21 AM  Result Value Ref Range   FIO2 0.60 %   Delivery systems VENTILATOR    Mode PRESSURE REGULATED VOLUME CONTROL    MECHVT  500 mL   RATE 20 resp/min   PEEP 8 cm H20   pH, Arterial 7.29 (L) 7.35 - 7.45   pCO2 arterial 65 (H) 32 - 48 mmHg   pO2, Arterial 114 (H) 83 - 108 mmHg   Bicarbonate 31.3 (H) 20.0 - 28.0 mmol/L   Acid-Base Excess 2.8 (H) 0.0 - 2.0 mmol/L   O2 Saturation 99.1 %   Patient temperature 37.0    Collection site RIGHT RADIAL    Drawn by [id number]    Allens test (pass/fail) PASS PASS    Comment: Performed at [facility], [address] [place], [place] [address]  CBG monitoring, ED     Status: Abnormal   Collection Time: 07/25/23  1:57 AM  Result Value Ref Range   Glucose-Capillary 113 (H) 70 - 99 mg/dL    Comment: Glucose reference range applies only to samples taken after fasting for at least 8 hours.   US Abdomen Complete  Result Date: 07/25/2023 CLINICAL DATA:  Pulmonary edema. EXAM: ABDOMEN ULTRASOUND COMPLETE COMPARISON:  None Available. FINDINGS: Gallbladder: Limited evaluation of the gallbladder secondary to limited patient positioning. No gallstones or wall thickening visualized (4.2 mm). No  sonographic Murphy sign noted by sonographer. Common bile duct: Diameter: N/A (not clearly visualized) Liver: No focal lesion identified. Diffusely increased echogenicity of the liver parenchyma is noted. Portal vein is patent on color Doppler imaging with normal direction of blood flow towards the liver. IVC: No abnormality visualized. Pancreas: Visualized portion unremarkable. Spleen: Size (12.8 cm) and appearance within normal limits. Right Kidney: Length: 9.7 cm. Echogenicity within normal limits. No mass or hydronephrosis visualized. Left Kidney: Length: 10.5 cm. Echogenicity within normal limits. No mass or hydronephrosis visualized. Abdominal aorta: No aneurysm visualized (1.7 cm in AP diameter). Other findings: It should be noted that the study is markedly limited secondary to the patient's body habitus and patient intubation. IMPRESSION: 1. Markedly limited study, as described above. 2. Hepatic steatosis without focal liver lesions. Electronically Signed   By: Aram Candela M.D.   On: 07/25/2023 02:10   DG Chest Portable 1 View  Result Date: 07/25/2023 CLINICAL DATA:  Check intubation.  ETT placement. EXAM: PORTABLE CHEST 1 VIEW COMPARISON:  Chest CT without contrast yesterday at 6:47 p.m. FINDINGS: 12:34 a.m. ETT interval insertion, the tip 3.2 cm from the carina. NGT also new, enters the stomach with the intragastric course not filmed. Also new left IJ central line the tip in the mid SVC. 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 Aortic atherosclerosis Moderate cardiomegaly. There is mild central vascular prominence without overt edema. The lungs are clear of focal infiltrates. No pleural effusion is seen. No new osseous findings. IMPRESSION: 1. ETT tip 3.2 cm from the carina. 2. NGT enters the stomach with the intragastric course not filmed. 3. Left IJ central line tip in the mid SVC. 4. Cardiomegaly with mild central vascular prominence without overt edema. 5. Aortic  atherosclerosis. Electronically Signed   By: Almira Bar M.D.   On: 07/25/2023 01:12   CT CHEST ABDOMEN PELVIS WO CONTRAST  Result Date: 07/24/2023 CLINICAL DATA:  Shortness of breath EXAM: CT CHEST, ABDOMEN AND PELVIS WITHOUT CONTRAST TECHNIQUE: Multidetector CT imaging of the chest, abdomen and pelvis was performed following the standard protocol without IV contrast. RADIATION DOSE REDUCTION: This exam was performed according to the departmental dose-optimization program which includes automated exposure control, adjustment of the mA and/or kV according to patient size and/or use of iterative reconstruction  technique. COMPARISON:  None Available. FINDINGS: CT CHEST FINDINGS Cardiovascular: Cardiomegaly. No pericardial effusion. Thoracic aorta is nonaneurysmal. Atherosclerotic calcifications of the aorta and coronary arteries. Central pulmonary vasculature is moderately dilated. Mediastinum/Nodes: No enlarged mediastinal, hilar, or axillary lymph nodes. Thyroid gland, trachea, and esophagus demonstrate no significant findings. Lungs/Pleura: Mild bibasilar subsegmental atelectasis. Lungs are otherwise clear. No pleural effusion or pneumothorax. Musculoskeletal: No acute osseous abnormality. Multilevel bridging anterior endplate osteophytes throughout the thoracic spine compatible with diffuse idiopathic skeletal hyperostosis. Gynecomastia is present on the left. CT ABDOMEN PELVIS FINDINGS Hepatobiliary: Unremarkable unenhanced appearance of the liver. There may be layering sludge within the gallbladder. No pericholecystic inflammatory changes by CT. No biliary dilatation. Pancreas: Unremarkable. No pancreatic ductal dilatation or surrounding inflammatory changes. Spleen: Normal in size without focal abnormality. Adrenals/Urinary Tract: Adrenal glands are unremarkable. Kidneys are normal, without renal calculi, focal lesion, or hydronephrosis. Bladder is unremarkable. Stomach/Bowel: Stomach is within normal  limits. Scattered colonic diverticulosis. No evidence of bowel wall thickening, distention, or inflammatory changes. Vascular/Lymphatic: Aortic atherosclerosis. No enlarged abdominal or pelvic lymph nodes. Reproductive: Prostate is unremarkable. Other: No free fluid. No abdominopelvic fluid collection. No pneumoperitoneum. No abdominal wall hernia. Musculoskeletal: Skin thickening and subcutaneous edema of the lower anterior abdominal wall/pannus. No fluid collections. No acute osseous abnormality. Appearance of the proximal right femur with coarsened trabecula suggestive of Paget's disease. IMPRESSION: 1. Skin thickening and subcutaneous edema of the lower anterior abdominal wall/pannus. Correlate for cellulitis. No fluid collections. 2. Otherwise, no acute intrathoracic or intra-abdominal findings. 3. Cardiomegaly with moderate dilatation of the central pulmonary vasculature, which can be seen in the setting of pulmonary hypertension. 4. Colonic diverticulosis without evidence of acute diverticulitis. 5. Appearance of the proximal right femur with coarsened trabecula suggestive of Paget's disease. 6. Aortic atherosclerosis (ICD10-I70.0). Electronically Signed   By: Duanne Guess D.O.   On: 07/24/2023 19:31   DG Chest 2 View  Result Date: 07/24/2023 CLINICAL DATA:  Shortness of breath. EXAM: CHEST - 2 VIEW COMPARISON:  None Available. FINDINGS: Limited exam. Low lung volume. Mild central vascular congestion, likely accentuated by low lung volume. No frank pulmonary edema. Bilateral lung fields are otherwise clear. No acute consolidation or lung collapse. Bilateral costophrenic angles are clear. Moderately enlarged cardio-mediastinal silhouette, which is accentuated by low lung volume and AP technique. No acute osseous abnormalities. The soft tissues are within normal limits. IMPRESSION: *Limited exam.  No active cardiopulmonary disease. Correlate clinically to determine the need for additional imaging with  chest CT scan. Electronically Signed   By: Jules Schick M.D.   On: 07/24/2023 15:55    Pending Labs Unresulted Labs (From admission, onward)     Start     Ordered   07/25/23 0500  Prealbumin  Tomorrow morning,   R        07/24/23 2034   07/25/23 0500  Hepatitis panel, acute  Tomorrow morning,   R        07/24/23 2034   07/25/23 0500  Hemoglobin A1c  Tomorrow morning,   R        07/24/23 2223   07/25/23 0500  TSH  Tomorrow morning,   R        07/24/23 2223   07/25/23 0500  T4, free  Tomorrow morning,   R        07/24/23 2223   07/25/23 0500  Lipid panel  Tomorrow morning,   R        07/24/23 2223   07/25/23 0500  CBC  Tomorrow morning,   R        07/24/23 2224   07/25/23 0500  Comprehensive metabolic panel  Tomorrow morning,   R        07/24/23 2224   07/25/23 0500  Magnesium  Tomorrow morning,   R        07/24/23 2224   07/25/23 0000  Lactic acid, plasma  (Lactic Acid)  STAT Now then every 3 hours,   R      07/24/23 2035   07/24/23 2257  HIV Antibody (routine testing w rflx)  (HIV Antibody (Routine testing w reflex) panel)  Once,   R        07/24/23 2258            Vitals/Pain Today's Vitals   07/25/23 0150 07/25/23 0155 07/25/23 0200 07/25/23 0245  BP: 127/83 126/76 125/73 128/80  Pulse: (!) 58 (!) 58 (!) 58 (!) 59  Resp: (!) 23 20 20 20   Temp:      TempSrc:      SpO2: 96% 96% 96% 95%  Weight:      Height:      PainSc:        Isolation Precautions No active isolations  Medications Medications  docusate sodium (COLACE) capsule 100 mg (has no administration in time range)  polyethylene glycol (MIRALAX / GLYCOLAX) packet 17 g (has no administration in time range)  aspirin EC tablet 325 mg (has no administration in time range)  insulin aspart (novoLOG) injection 0-9 Units ( Subcutaneous Not Given 07/25/23 0159)  furosemide (LASIX) injection 80 mg (80 mg Intravenous Given 07/25/23 0052)  famotidine (PEPCID) IVPB 20 mg premix (0 mg Intravenous Stopped 07/25/23  0137)  acetaminophen (TYLENOL) tablet 650 mg (has no administration in time range)  ondansetron (ZOFRAN) injection 4 mg (has no administration in time range)  nystatin cream (MYCOSTATIN) ( Topical Not Given 07/25/23 0108)  etomidate (AMIDATE) injection 20 mg (20 mg Intravenous See Procedure Record 07/25/23 0051)  fentaNYL in NS (42mcg/ml) infusion-PREMIX (100 mcg/hr Intravenous Rate/Dose Change 07/25/23 0112)  dexmedetomidine (PRECEDEX) bolus via infusion 206.4 mcg (206.4 mcg Intravenous Not Given 07/25/23 0056)  midazolam (VERSED) injection 4 mg (4 mg Intravenous See Procedure Record 07/25/23 0053)  dexmedetomidine (PRECEDEX) 400 MCG/100ML (4 mcg/mL) infusion (1.5 mcg/kg/hr  206.4 kg Intravenous New Bag/Given 07/25/23 0045)  furosemide (LASIX) injection 60 mg (60 mg Intravenous Given 07/24/23 1739)  heparin bolus via infusion 6,000 Units (6,000 Units Intravenous Bolus from Bag 07/24/23 2120)  insulin aspart (novoLOG) injection 5 Units (5 Units Intravenous Given 07/25/23 0100)    And  dextrose 50 % solution 50 mL (50 mLs Intravenous Given 07/25/23 0102)  magnesium sulfate IVPB 2 g 50 mL (0 g Intravenous Stopped 07/25/23 0200)  fentaNYL (SUBLIMAZE) injection 50 mcg (50 mcg Intravenous Given 07/25/23 0010)  midazolam (VERSED) 5 MG/5ML injection (4 mg Intravenous Given 07/24/23 2356)  etomidate (AMIDATE) injection (20 mg Intravenous Given 07/24/23 2358)    Mobility non-ambulatory     Focused Assessments Pulmonary Assessment Handoff:  Lung sounds: Bilateral Breath Sounds: Clear, Diminished O2 Device: Bi-PAP O2 Flow Rate (L/min): 6 L/min    R Recommendations: See Admitting Provider Note  Report given to:   Additional Notes: N/A

## 2023-07-25 NOTE — Procedures (Signed)
Intubation Procedure Note  TAMAR ZACHMAN  [id number]  04-05-1956  Date:07/25/23  Time:12:33 AM   Provider Performing:Kelli Egolf Allen Norris    Procedure: Intubation (31500)  Indication(s) Respiratory Failure  Consent Risks of the procedure as well as the alternatives and risks of each were explained to the patient and/or caregiver.  Consent for the procedure was obtained and is signed in the bedside chart   Anesthesia Etomidate, Versed, and Fentanyl   Time Out Verified patient identification, verified procedure, site/side was marked, verified correct patient position, special equipment/implants available, medications/allergies/relevant history reviewed, required imaging and test results available.   Sterile Technique Usual hand hygeine, masks, and gloves were used   Procedure Description Patient positioned in bed supine.  Sedation given as noted above.  Patient was intubated with endotracheal tube using Glidescope.  View was Grade 3 only epiglottis .  Number of attempts was 1.  Colorimetric CO2 detector was consistent with tracheal placement.   Complications/Tolerance None; patient tolerated the procedure well. Chest X-ray is ordered to verify placement.   EBL Minimal   Specimen(s) None

## 2023-07-25 NOTE — Progress Notes (Signed)
After about 4 hours off Precedex, off fentanyl completely, patient is arousable, follows simple commands, moving all extremities  Started getting agitated  Precedex was reinitiated with fentanyl with instructions of much lower doses than where he was before  ABG noted with hypoxemia and hypercapnia but much better than previous  BUN/creatinine trending better

## 2023-07-25 NOTE — Plan of Care (Signed)

## 2023-07-25 NOTE — Progress Notes (Signed)
Transported on ventilator to ICU without event.

## 2023-07-25 NOTE — ED Notes (Signed)
Attempt to call report x 1  

## 2023-07-25 NOTE — Progress Notes (Signed)
NAME:  Levi [name], MRN:  [id number], DOB:  25-Sep-1955, LOS: 1 ADMISSION DATE:  07/24/2023, CONSULTATION DATE: 07/24/2023  REFERRING MD: Marlene Bast, CHIEF COMPLAINT: Shortness of breath and hypoxia  History of Present Illness:  A 67 yr old male patient with morbid obesity, probably untreated OSA/OHS, and knee OA, who presented to ED with dry cough, DOE, SOB, LL edema, orthopnea for 1-2 weeks. Home health RN found his SpO2 64% on RA. Given a dose of Albuterol. Denied wheezing, CP, N/V/D, abd pain, f/c/r, and rash. Not on home O2 or NIV.  No smoking, illicit drug use, or alcoholism.  No hx of COPD, asthma, VTE, cardiac hx, liver disease, or CKD Developed runs of VT in ED. No EKG changes initially.   Pertinent  Medical History  History reviewed. No pertinent past medical history.   Significant Hospital Events: Including procedures, antibiotic start and stop dates in addition to other pertinent events   11/29 admitted  Interim History / Subjective:  Unresponsive, on vent No significant overnight events  Objective   Blood pressure 103/71, pulse (!) 58, temperature (!) 97.5 F (36.4 C), temperature source Oral, resp. rate 20, height 5' 7.01" (1.702 m), weight (!) 206.4 kg, SpO2 93%.    Vent Mode: PRVC FiO2 (%):  [30 %-60 %] 40 % Set Rate:  [20 bmp] 20 bmp Vt Set:  [500 mL] 500 mL PEEP:  [8 cmH20] 8 cmH20 Plateau Pressure:  [25 cmH20-26 cmH20] 26 cmH20   Intake/Output Summary (Last 24 hours) at 07/25/2023 0955 Last data filed at 07/25/2023 0801 Gross per 24 hour  Intake 645.42 ml  Output 2850 ml  Net -2204.58 ml   Filed Weights   07/24/23 1452 07/25/23 0530  Weight: (!) 206.4 kg (!) 206.4 kg    Examination: General: Middle-aged, morbidly obese HENT: Endotracheal tube in place Lungs: Decreased air movement bilaterally Cardiovascular: S1-S2 appreciated Abdomen: Obese, bowel sounds appreciated Extremities: Edema Neuro: Unresponsive, pupils reactive about 2 mm GU:    Resolved Hospital Problem list     Assessment & Plan:  Decompensated congestive heart failure -Echocardiogram ordered, results pending -On Lasix -Change Lasix to every 12 from every 6  Hypercapnic respiratory failure -Continue mechanical ventilation per ARDS protocol -Target TVol 6-8cc/kgIBW -Target Plateau Pressure < 30cm H20 -Target driving pressure less than 15 cm of water -Target PaO2 55-65: titrate PEEP/FiO2 per protocol -Ventilator associated pneumonia prevention protocol  Runs of VT -Received metoprolol -Electrolytes corrected  Elevated troponin likely due to demand ischemia -Follow echocardiogram  Acute kidney injury -Avoid nephrotoxic medications -Trend renal parameters -Maintain renal perfusion  Congestive hepatopathy likely related to decompensated heart failure -Continue to monitor  Morbid obesity -Continue to monitor  Is on Precedex for sedation -Will hold Precedex at present to ascertain responsiveness  Best Practice (right click and "Reselect all SmartList Selections" daily)   Diet/type: NPO DVT prophylaxis: SCDs Start: 07/24/23 2257   Pressure ulcer(s): not present on admission  GI prophylaxis: H2B Lines: Central line Foley:  Yes, and it is still needed Code Status:  full code Last date of multidisciplinary goals of care discussion [pending]  Labs   CBC: Recent Labs  Lab 07/24/23 1558 07/24/23 1815 07/24/23 2248 07/25/23 0805  WBC  --  11.5*  --  8.2  NEUTROABS Not Measured 8.8*  --   --   HGB  --  15.6 20.1* 15.2  HCT  --  52.0 59.0* 48.7  MCV  --  87.4  --  84.5  PLT  --  184  --  158    Basic Metabolic Panel: Recent Labs  Lab 07/24/23 1558 07/24/23 2024 07/24/23 2248 07/25/23 0805  NA 134* 132* 134* 133*  K 5.6* 5.6* 5.7* 5.3*  CL 89* 90* 91* 91*  CO2 30 26  --  31  GLUCOSE 127* 126* 123* 99  BUN 86* 91* 99* 90*  CREATININE 3.60* 3.33* 3.10* 2.77*  CALCIUM 8.9 8.5*  --  8.2*  MG  --  2.7*  --  2.5*  PHOS  --   7.6*  --   --    GFR: Estimated Creatinine Clearance: 44.1 mL/min (A) (by C-G formula based on SCr of 2.77 mg/dL (H)). Recent Labs  Lab 07/24/23 1815 07/24/23 1822 07/24/23 2024 07/24/23 2049 07/24/23 2325 07/25/23 0805  PROCALCITON  --   --  0.54  --   --   --   WBC 11.5*  --   --   --   --  8.2  LATICACIDVEN  --  1.3  --  1.5 1.6 1.3    Liver Function Tests: Recent Labs  Lab 07/24/23 1558 07/24/23 2024 07/25/23 0805  AST 475* 426* 252*  ALT 700* 666* 490*  ALKPHOS 211* 198* 159*  BILITOT 4.5* 3.9* 3.5*  PROT 8.7* 8.7* 7.2  ALBUMIN 4.4 4.4 3.6   No results for input(s): "LIPASE", "AMYLASE" in the last 168 hours. Recent Labs  Lab 07/24/23 2035  AMMONIA <10    ABG    Component Value Date/Time   PHART 7.29 (L) 07/25/2023 0121   PCO2ART 65 (H) 07/25/2023 0121   PO2ART 114 (H) 07/25/2023 0121   HCO3 31.3 (H) 07/25/2023 0121   TCO2 33 (H) 07/24/2023 2248   ACIDBASEDEF 0.5 07/24/2023 2040   O2SAT 99.1 07/25/2023 0121     Coagulation Profile: Recent Labs  Lab 07/24/23 2054  INR 1.5*    Cardiac Enzymes: Recent Labs  Lab 07/24/23 2024  CKTOTAL 396    HbA1C: No results found for: "HGBA1C"  CBG: Recent Labs  Lab 07/24/23 2346 07/25/23 0157 07/25/23 0334 07/25/23 0756  GLUCAP 119* 113* 95 116*    Review of Systems:   No overnight events Unresponsive  Past Medical History:  He,  has no past medical history on file.   Surgical History:  History reviewed. No pertinent surgical history.   Social History:   reports that he has never smoked. He has never used smokeless tobacco.   Family History:  His family history is not on file.   Allergies No Known Allergies   The patient is critically ill with multiple organ systems failure and requires high complexity decision making for assessment and support, frequent evaluation and titration of therapies, application of advanced monitoring technologies and extensive interpretation of multiple  databases. Critical Care Time devoted to patient care services described in this note independent of APP/resident time (if applicable)  is 35 minutes.   Virl Diamond MD [facility] Personal pager: See Amion If unanswered, please page CCM On-call: #[phone]

## 2023-07-26 DIAGNOSIS — I5021 Acute systolic (congestive) heart failure: Secondary | ICD-10-CM | POA: Diagnosis not present

## 2023-07-26 LAB — CBC WITH DIFFERENTIAL/PLATELET
Abs Immature Granulocytes: 0.05 10*3/uL (ref 0.00–0.07)
Basophils Absolute: 0 10*3/uL (ref 0.0–0.1)
Basophils Relative: 0 %
Eosinophils Absolute: 0 10*3/uL (ref 0.0–0.5)
Eosinophils Relative: 0 %
HCT: 54.3 % — ABNORMAL HIGH (ref 39.0–52.0)
Hemoglobin: 16.9 g/dL (ref 13.0–17.0)
Immature Granulocytes: 1 %
Lymphocytes Relative: 3 %
Lymphs Abs: 0.4 10*3/uL — ABNORMAL LOW (ref 0.7–4.0)
MCH: 25.6 pg — ABNORMAL LOW (ref 26.0–34.0)
MCHC: 31.1 g/dL (ref 30.0–36.0)
MCV: 82.4 fL (ref 80.0–100.0)
Monocytes Absolute: 0.6 10*3/uL (ref 0.1–1.0)
Monocytes Relative: 6 %
Neutro Abs: 9.7 10*3/uL — ABNORMAL HIGH (ref 1.7–7.7)
Neutrophils Relative %: 90 %
Platelets: 154 10*3/uL (ref 150–400)
RBC: 6.59 MIL/uL — ABNORMAL HIGH (ref 4.22–5.81)
RDW: 18.2 % — ABNORMAL HIGH (ref 11.5–15.5)
WBC: 10.8 10*3/uL — ABNORMAL HIGH (ref 4.0–10.5)
nRBC: 0.3 % — ABNORMAL HIGH (ref 0.0–0.2)

## 2023-07-26 LAB — GLUCOSE, CAPILLARY
Glucose-Capillary: 102 mg/dL — ABNORMAL HIGH (ref 70–99)
Glucose-Capillary: 104 mg/dL — ABNORMAL HIGH (ref 70–99)
Glucose-Capillary: 127 mg/dL — ABNORMAL HIGH (ref 70–99)
Glucose-Capillary: 129 mg/dL — ABNORMAL HIGH (ref 70–99)
Glucose-Capillary: 133 mg/dL — ABNORMAL HIGH (ref 70–99)
Glucose-Capillary: 77 mg/dL (ref 70–99)
Glucose-Capillary: 86 mg/dL (ref 70–99)

## 2023-07-26 LAB — COOXEMETRY PANEL
Carboxyhemoglobin: 2.2 % — ABNORMAL HIGH (ref 0.5–1.5)
Methemoglobin: <0.7 % (ref 0.0–1.5)
O2 Saturation: 74.1 %
Total hemoglobin: 17.5 g/dL — ABNORMAL HIGH (ref 12.0–16.0)

## 2023-07-26 LAB — COMPREHENSIVE METABOLIC PANEL
ALT: 371 U/L — ABNORMAL HIGH (ref 0–44)
AST: 153 U/L — ABNORMAL HIGH (ref 15–41)
Albumin: 3.5 g/dL (ref 3.5–5.0)
Alkaline Phosphatase: 153 U/L — ABNORMAL HIGH (ref 38–126)
Anion gap: 13 (ref 5–15)
BUN: 77 mg/dL — ABNORMAL HIGH (ref 8–23)
CO2: 34 mmol/L — ABNORMAL HIGH (ref 22–32)
Calcium: 8.4 mg/dL — ABNORMAL LOW (ref 8.9–10.3)
Chloride: 88 mmol/L — ABNORMAL LOW (ref 98–111)
Creatinine, Ser: 1.83 mg/dL — ABNORMAL HIGH (ref 0.61–1.24)
GFR, Estimated: 40 mL/min — ABNORMAL LOW (ref >60)
Glucose, Bld: 84 mg/dL (ref 70–99)
Potassium: 4 mmol/L (ref 3.5–5.1)
Sodium: 135 mmol/L (ref 135–145)
Total Bilirubin: 3.5 mg/dL — ABNORMAL HIGH (ref <1.2)
Total Protein: 7.2 g/dL (ref 6.5–8.1)

## 2023-07-26 LAB — MAGNESIUM
Magnesium: 1.9 mg/dL (ref 1.7–2.4)
Magnesium: 2 mg/dL (ref 1.7–2.4)

## 2023-07-26 LAB — PHOSPHORUS: Phosphorus: 3.7 mg/dL (ref 2.5–4.6)

## 2023-07-26 MED ORDER — PROSOURCE TF20 ENFIT COMPATIBL EN LIQD
60.0000 mL | Freq: Every day | ENTERAL | Status: DC
  Administered 2023-07-26 – 2023-07-27 (×2): 60 mL
  Filled 2023-07-26 (×2): qty 60

## 2023-07-26 MED ORDER — PIVOT 1.5 CAL PO LIQD
1000.0000 mL | ORAL | Status: DC
  Administered 2023-07-26: 1000 mL
  Filled 2023-07-26 (×2): qty 1000

## 2023-07-26 NOTE — Progress Notes (Signed)
NAME:  Levi [name], MRN:  [id number], DOB:  Dec 31, 1955, LOS: 2 ADMISSION DATE:  07/24/2023, CONSULTATION DATE: 07/24/2023  REFERRING MD: Marlene Bast, CHIEF COMPLAINT: Shortness of breath and hypoxia  History of Present Illness:  A 67 yr old male patient with morbid obesity, probably untreated OSA/OHS, and knee OA, who presented to ED with dry cough, DOE, SOB, LL edema, orthopnea for 1-2 weeks. Home health RN found his SpO2 64% on RA. Given a dose of Albuterol. Denied wheezing, CP, N/V/D, abd pain, f/c/r, and rash. Not on home O2 or NIV.  No smoking, illicit drug use, or alcoholism.  No hx of COPD, asthma, VTE, cardiac hx, liver disease, or CKD Developed runs of VT in ED. No EKG changes initially.   Pertinent  Medical History  History reviewed. No pertinent past medical history.  Significant Hospital Events: Including procedures, antibiotic start and stop dates in addition to other pertinent events   11/29 admitted  Interim History / Subjective:  When sedation is off, he is arousable, does not follow commands consistently  Objective   Blood pressure 114/63, pulse 68, temperature 99.5 F (37.5 C), temperature source Oral, resp. rate 20, height 5' 7.01" (1.702 m), weight (!) 201.5 kg, SpO2 90%. CVP:  [10 mmHg] 10 mmHg  Vent Mode: PRVC FiO2 (%):  [30 %-40 %] 30 % Set Rate:  [20 bmp] 20 bmp Vt Set:  [500 mL] 500 mL PEEP:  [8 cmH20] 8 cmH20 Pressure Support:  [10 cmH20] 10 cmH20 Plateau Pressure:  [18 cmH20-22 cmH20] 20 cmH20   Intake/Output Summary (Last 24 hours) at 07/26/2023 1405 Last data filed at 07/26/2023 1210 Gross per 24 hour  Intake 870.32 ml  Output 8150 ml  Net -7279.68 ml   Filed Weights   07/24/23 1452 07/25/23 0530 07/26/23 0500  Weight: (!) 206.4 kg (!) 206.4 kg (!) 201.5 kg    Examination: General: No age, morbidly obese HENT: Endotracheal tube in place Lungs: Decreased air movement bilaterally Cardiovascular: S1-S2 appreciated Abdomen: Obese, bowel  sounds appreciated Extremities: Edema Neuro: Is responsive with decreased sedation GU:   Resolved Hospital Problem list     Assessment & Plan:   Decompensated congestive heart failure -Continue Lasix every 12  Hypercapnic respiratory failure -Continue mechanical ventilation -Target TVol 6-8cc/kgIBW -Target Plateau Pressure < 30cm H20 -Target driving pressure less than 15 cm of water -Target PaO2 55-65: titrate PEEP/FiO2 per protocol -Ventilator associated pneumonia prevention protocol  Did have runs of V. tach when he came in -Did receive metoprolol, electrolytes corrected  Acute kidney injury -Continue to maintain renal perfusion -Trend renal parameters  Congestive hepatopathy -Continue to monitor  Morbid obesity  Precedex and fentanyl for agitation management Best Practice (right click and "Reselect all SmartList Selections" daily)   Diet/type: tubefeeds DVT prophylaxis: heparin injection 5,000 Units Start: 07/25/23 1400 SCDs Start: 07/24/23 2257  Pressure ulcer(s): not present on admission  GI prophylaxis: H2B Lines: Central line Foley:  Yes, and it is still needed Code Status:  full code Last date of multidisciplinary goals of care discussion [discussed with patient's spouse and brother at bedside 12/1]  Labs   CBC: Recent Labs  Lab 07/24/23 1558 07/24/23 1815 07/24/23 2248 07/25/23 0805 07/26/23 1044  WBC  --  11.5*  --  8.2 10.8*  NEUTROABS Not Measured 8.8*  --   --  9.7*  HGB  --  15.6 20.1* 15.2 16.9  HCT  --  52.0 59.0* 48.7 54.3*  MCV  --  87.4  --  84.5 82.4  PLT  --  184  --  158 154    Basic Metabolic Panel: Recent Labs  Lab 07/24/23 1558 07/24/23 2024 07/24/23 2248 07/25/23 0805 07/26/23 0816  NA 134* 132* 134* 133* 135  K 5.6* 5.6* 5.7* 5.3* 4.0  CL 89* 90* 91* 91* 88*  CO2 30 26  --  31 34*  GLUCOSE 127* 126* 123* 99 84  BUN 86* 91* 99* 90* 77*  CREATININE 3.60* 3.33* 3.10* 2.77* 1.83*  CALCIUM 8.9 8.5*  --  8.2* 8.4*  MG   --  2.7*  --  2.5* 1.9  PHOS  --  7.6*  --   --   --    GFR: Estimated Creatinine Clearance: 65.7 mL/min (A) (by C-G formula based on SCr of 1.83 mg/dL (H)). Recent Labs  Lab 07/24/23 1815 07/24/23 1822 07/24/23 2024 07/24/23 2049 07/24/23 2325 07/25/23 0805 07/26/23 1044  PROCALCITON  --   --  0.54  --   --   --   --   WBC 11.5*  --   --   --   --  8.2 10.8*  LATICACIDVEN  --  1.3  --  1.5 1.6 1.3  --     Liver Function Tests: Recent Labs  Lab 07/24/23 1558 07/24/23 2024 07/25/23 0805 07/26/23 0816  AST 475* 426* 252* 153*  ALT 700* 666* 490* 371*  ALKPHOS 211* 198* 159* 153*  BILITOT 4.5* 3.9* 3.5* 3.5*  PROT 8.7* 8.7* 7.2 7.2  ALBUMIN 4.4 4.4 3.6 3.5   No results for input(s): "LIPASE", "AMYLASE" in the last 168 hours. Recent Labs  Lab 07/24/23 2035  AMMONIA <10    ABG    Component Value Date/Time   PHART 7.44 07/25/2023 1132   PCO2ART 49 (H) 07/25/2023 1132   PO2ART 78 (L) 07/25/2023 1132   HCO3 33.3 (H) 07/25/2023 1132   TCO2 33 (H) 07/24/2023 2248   ACIDBASEDEF 0.5 07/24/2023 2040   O2SAT 74.1 07/26/2023 0816     Coagulation Profile: Recent Labs  Lab 07/24/23 2054  INR 1.5*    Cardiac Enzymes: Recent Labs  Lab 07/24/23 2024  CKTOTAL 396    HbA1C: Hgb A1c MFr Bld  Date/Time Value Ref Range Status  07/25/2023 08:05 AM 7.5 (H) 4.8 - 5.6 % Final    Comment:    (NOTE) Pre diabetes:          5.7%-6.4%  Diabetes:              >6.4%  Glycemic control for   <7.0% adults with diabetes     CBG: Recent Labs  Lab 07/25/23 1948 07/25/23 2316 07/26/23 0349 07/26/23 0749 07/26/23 1126  GLUCAP 87 105* 77 102* 86    Review of Systems:   No overnight events Unresponsive  Past Medical History:  He,  has no past medical history on file.   Surgical History:  History reviewed. No pertinent surgical history.   Social History:   reports that he has never smoked. He has never used smokeless tobacco.   Family History:  His family  history is not on file.   Allergies No Known Allergies    The patient is critically ill with multiple organ systems failure and requires high complexity decision making for assessment and support, frequent evaluation and titration of therapies, application of advanced monitoring technologies and extensive interpretation of multiple databases. Critical Care Time devoted to patient care services described in this note independent of APP/resident time (if applicable)  is 40 minutes.   Virl Diamond MD [place] Personal pager: See Amion If unanswered, please page CCM On-call: #[phone]

## 2023-07-26 NOTE — Progress Notes (Signed)
Rounding Note    [name] Date of Encounter: 07/26/2023  [facility] Cardiologist: New  Subjective   No events overnight.   Inpatient Medications    Scheduled Meds:  aspirin EC  325 mg Oral Daily   Chlorhexidine Gluconate Cloth  6 each Topical Daily   dexmedetomidine  1 mcg/kg Intravenous Once   etomidate  20 mg Intravenous Once   furosemide  80 mg Intravenous Q12H   heparin injection (subcutaneous)  5,000 Units Subcutaneous Q8H   insulin aspart  0-9 Units Subcutaneous Q4H   midazolam  4 mg Intravenous Once   nystatin cream   Topical TID   Continuous Infusions:  dexmedetomidine (PRECEDEX) IV infusion 0.7 mcg/kg/hr (07/26/23 1610)   famotidine (PEPCID) IV Stopped (07/26/23 0245)   fentaNYL infusion INTRAVENOUS 100 mcg/hr (07/26/23 9604)   PRN Meds: acetaminophen, docusate sodium, fentaNYL (SUBLIMAZE) injection, ondansetron (ZOFRAN) IV, polyethylene glycol   Vital Signs    Vitals:   07/26/23 0200 07/26/23 0300 07/26/23 0400 07/26/23 0500  BP: 138/78 (!) 144/79 (!) 150/86   Pulse: (!) 53 (!) 53 (!) 59   Resp: 20 20 20    Temp:   98.2 F (36.8 C)   TempSrc:   Oral   SpO2: 96% 94% 94%   Weight:    (!) 201.5 kg  Height:        Intake/Output Summary (Last 24 hours) at 07/26/2023 0720 Last data filed at 07/26/2023 5409 Gross per 24 hour  Intake 873.27 ml  Output 7350 ml  Net -6476.73 ml      07/26/2023    5:00 AM 07/25/2023    5:30 AM 07/24/2023    2:52 PM  Last 3 Weights  Weight (lbs) 444 lb 3.2 oz 455 lb 0.5 oz 455 lb  Weight (kg) 201.488 kg 206.4 kg 206.387 kg      Telemetry    NSR - Personally Reviewed  ECG    N/a - Personally Reviewed  Physical Exam   GEN: No acute distress.   Neck: No JVD Cardiac: RRR, no murmurs, rubs, or gallops.  Respiratory: Crackles bilateral bases. GI: Soft, nontender, non-distended  MS: trace bilateral edema Neuro:  Nonfocal  Psych: Normal affect   Labs    High Sensitivity  Troponin:   Recent Labs  Lab 07/24/23 1558 07/24/23 1815 07/24/23 2326 07/25/23 0805 07/25/23 1148  TROPONINIHS 164* 169* 156* 371* 302*     Chemistry Recent Labs  Lab 07/24/23 1558 07/24/23 2024 07/24/23 2248 07/25/23 0805  NA 134* 132* 134* 133*  K 5.6* 5.6* 5.7* 5.3*  CL 89* 90* 91* 91*  CO2 30 26  --  31  GLUCOSE 127* 126* 123* 99  BUN 86* 91* 99* 90*  CREATININE 3.60* 3.33* 3.10* 2.77*  CALCIUM 8.9 8.5*  --  8.2*  MG  --  2.7*  --  2.5*  PROT 8.7* 8.7*  --  7.2  ALBUMIN 4.4 4.4  --  3.6  AST 475* 426*  --  252*  ALT 700* 666*  --  490*  ALKPHOS 211* 198*  --  159*  BILITOT 4.5* 3.9*  --  3.5*  GFRNONAA 18* 19*  --  24*  ANIONGAP 15 16*  --  11    Lipids  Recent Labs  Lab 07/25/23 0805  CHOL 127  TRIG 126  HDL 15*  LDLCALC 87  CHOLHDL 8.5    Hematology Recent Labs  Lab 07/24/23 1815 07/24/23 2248 07/25/23 0805  WBC 11.5*  --  8.2  RBC 5.95*  --  5.76  HGB 15.6 20.1* 15.2  HCT 52.0 59.0* 48.7  MCV 87.4  --  84.5  MCH 26.2  --  26.4  MCHC 30.0  --  31.2  RDW 18.5*  --  18.2*  PLT 184  --  158   Thyroid  Recent Labs  Lab 07/25/23 0805  TSH 3.536  FREET4 0.97    BNP Recent Labs  Lab 07/24/23 1815  BNP 798.8*    DDimer No results for input(s): "DDIMER" in the last 168 hours.   Radiology    ECHOCARDIOGRAM COMPLETE  Result Date: 07/25/2023    ECHOCARDIOGRAM REPORT   Patient Name:   WILLAIM DOUBERLY Date of Exam: 07/25/2023 Medical Rec #:  [id number]     Height:       67.0 in Accession #:    [id number]    Weight:       455.0 lb Date of Birth:  1955-12-28     BSA:          2.868 m Patient Age:    67 years      BP:           111/73 mmHg Patient Gender: M             HR:           57 bpm. Exam Location:  Inpatient Procedure: 2D Echo, Cardiac Doppler, Color Doppler and Intracardiac            Opacification Agent Indications:     I50.40* Unspecified combined systolic (congestive) and                  diastolic (congestive) heart failure  History:          Patient has no prior history of Echocardiogram examinations.                  CHF and Cardiomegaly, Pulmonary HTN, Arrythmias:VT;                  Signs/Symptoms:Chest Pain, Dyspnea, Shortness of Breath and                  Edema.  Sonographer:     Sheralyn Boatman RDCS Referring Phys:  [id number] Patrici Ranks Diagnosing Phys: Lennie Odor MD  Sonographer Comments: Technically difficult study due to poor echo windows, patient is obese and echo performed with patient supine and on artificial respirator. Image acquisition challenging due to patient body habitus. Delayed for labs by RN. IMPRESSIONS  1. Left ventricular ejection fraction, by estimation, is <20%. The left ventricle has severely decreased function. The left ventricle demonstrates global hypokinesis. The left ventricular internal cavity size was mildly dilated. There is mild concentric  left ventricular hypertrophy. Left ventricular diastolic parameters are consistent with Grade I diastolic dysfunction (impaired relaxation).  2. Right ventricular systolic function is severely reduced. The right ventricular size is severely enlarged. There is normal pulmonary artery systolic pressure. The estimated right ventricular systolic pressure is 29.4 mmHg.  3. The mitral valve is grossly normal. No evidence of mitral valve regurgitation. No evidence of mitral stenosis.  4. The aortic valve is tricuspid. Aortic valve regurgitation is trivial. No aortic stenosis is present.  5. There is mild dilatation of the ascending aorta, measuring 41 mm.  6. The inferior vena cava is dilated in size with <50% respiratory variability, suggesting right atrial pressure of 15 mmHg. Comparison(s): No prior Echocardiogram. Conclusion(s)/Recommendation(s): No left ventricular  mural or apical thrombus/thrombi. FINDINGS  Left Ventricle: Left ventricular ejection fraction, by estimation, is <20%. The left ventricle has severely decreased function. The left ventricle demonstrates global  hypokinesis. Definity contrast agent was given IV to delineate the left ventricular endocardial borders. The left ventricular internal cavity size was mildly dilated. There is mild concentric left ventricular hypertrophy. Left ventricular diastolic parameters are consistent with Grade I diastolic dysfunction (impaired relaxation). Right Ventricle: The right ventricular size is severely enlarged. No increase in right ventricular wall thickness. Right ventricular systolic function is severely reduced. There is normal pulmonary artery systolic pressure. The tricuspid regurgitant velocity is 1.90 m/s, and with an assumed right atrial pressure of 15 mmHg, the estimated right ventricular systolic pressure is 29.4 mmHg. Left Atrium: Left atrial size was normal in size. Right Atrium: Right atrial size was normal in size. Pericardium: There is no evidence of pericardial effusion. Mitral Valve: The mitral valve is grossly normal. No evidence of mitral valve regurgitation. No evidence of mitral valve stenosis. Tricuspid Valve: The tricuspid valve is grossly normal. Tricuspid valve regurgitation is mild . No evidence of tricuspid stenosis. Aortic Valve: The aortic valve is tricuspid. Aortic valve regurgitation is trivial. No aortic stenosis is present. Pulmonic Valve: The pulmonic valve was grossly normal. Pulmonic valve regurgitation is not visualized. No evidence of pulmonic stenosis. Aorta: The aortic root is normal in size and structure. There is mild dilatation of the ascending aorta, measuring 41 mm. Venous: The inferior vena cava is dilated in size with less than 50% respiratory variability, suggesting right atrial pressure of 15 mmHg. IAS/Shunts: The atrial septum is grossly normal.  LEFT VENTRICLE PLAX 2D LVIDd:         5.70 cm      Diastology LVIDs:         4.70 cm      LV e' medial:    3.15 cm/s LV PW:         1.20 cm      LV E/e' medial:  9.1 LV IVS:        1.10 cm      LV e' lateral:   3.15 cm/s LVOT diam:     2.40  cm      LV E/e' lateral: 9.1 LV SV:         61 LV SV Index:   21 LVOT Area:     4.52 cm  LV Volumes (MOD) LV vol d, MOD A2C: 136.0 ml LV vol d, MOD A4C: 151.5 ml LV vol s, MOD A2C: 115.0 ml LV vol s, MOD A4C: 108.7 ml LV SV MOD A2C:     21.0 ml LV SV MOD A4C:     151.5 ml LV SV MOD BP:      29.3 ml RIGHT VENTRICLE            IVC RV S prime:     4.03 cm/s  IVC diam: 3.10 cm TAPSE (M-mode): 0.9 cm LEFT ATRIUM             Index        RIGHT ATRIUM           Index LA diam:        3.90 cm 1.36 cm/m   RA Area:     22.90 cm LA Vol (A2C):   43.5 ml 15.17 ml/m  RA Volume:   77.40 ml  26.99 ml/m LA Vol (A4C):   69.2 ml 24.13 ml/m LA Biplane Vol: 56.0 ml 19.53  ml/m  AORTIC VALVE LVOT Vmax:   70.70 cm/s LVOT Vmean:  41.100 cm/s LVOT VTI:    0.134 m  AORTA Ao Root diam: 3.50 cm Ao Asc diam:  4.10 cm MITRAL VALVE               TRICUSPID VALVE MV Area (PHT): 3.65 cm    TR Peak grad:   14.4 mmHg MV Decel Time: 208 msec    TR Vmax:        190.00 cm/s MV E velocity: 28.70 cm/s MV A velocity: 44.60 cm/s  SHUNTS MV E/A ratio:  0.64        Systemic VTI:  0.13 m                            Systemic Diam: 2.40 cm Lennie Odor MD Electronically signed by Lennie Odor MD Signature Date/Time: 07/25/2023/1:28:30 PM    Final (Updated)    US Abdomen Complete  Result Date: 07/25/2023 CLINICAL DATA:  Pulmonary edema. EXAM: ABDOMEN ULTRASOUND COMPLETE COMPARISON:  None Available. FINDINGS: Gallbladder: Limited evaluation of the gallbladder secondary to limited patient positioning. No gallstones or wall thickening visualized (4.2 mm). No sonographic Murphy sign noted by sonographer. Common bile duct: Diameter: N/A (not clearly visualized) Liver: No focal lesion identified. Diffusely increased echogenicity of the liver parenchyma is noted. Portal vein is patent on color Doppler imaging with normal direction of blood flow towards the liver. IVC: No abnormality visualized. Pancreas: Visualized portion unremarkable. Spleen: Size (12.8 cm)  and appearance within normal limits. Right Kidney: Length: 9.7 cm. Echogenicity within normal limits. No mass or hydronephrosis visualized. Left Kidney: Length: 10.5 cm. Echogenicity within normal limits. No mass or hydronephrosis visualized. Abdominal aorta: No aneurysm visualized (1.7 cm in AP diameter). Other findings: It should be noted that the study is markedly limited secondary to the patient's body habitus and patient intubation. IMPRESSION: 1. Markedly limited study, as described above. 2. Hepatic steatosis without focal liver lesions. Electronically Signed   By: Aram Candela M.D.   On: 07/25/2023 02:10   DG Chest Portable 1 View  Result Date: 07/25/2023 CLINICAL DATA:  Check intubation.  ETT placement. EXAM: PORTABLE CHEST 1 VIEW COMPARISON:  Chest CT without contrast yesterday at 6:47 p.m. FINDINGS: 12:34 a.m. ETT interval insertion, the tip 3.2 cm from the carina. NGT also new, enters the stomach with the intragastric course not filmed. Also new left IJ central line the tip in the mid SVC. 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 Aortic atherosclerosis Moderate cardiomegaly. There is mild central vascular prominence without overt edema. The lungs are clear of focal infiltrates. No pleural effusion is seen. No new osseous findings. IMPRESSION: 1. ETT tip 3.2 cm from the carina. 2. NGT enters the stomach with the intragastric course not filmed. 3. Left IJ central line tip in the mid SVC. 4. Cardiomegaly with mild central vascular prominence without overt edema. 5. Aortic atherosclerosis. Electronically Signed   By: Almira Bar M.D.   On: 07/25/2023 01:12   CT CHEST ABDOMEN PELVIS WO CONTRAST  Result Date: 07/24/2023 CLINICAL DATA:  Shortness of breath EXAM: CT CHEST, ABDOMEN AND PELVIS WITHOUT CONTRAST TECHNIQUE: Multidetector CT imaging of the chest, abdomen and pelvis was performed following the standard protocol without IV contrast. RADIATION DOSE REDUCTION: This exam  was performed according to the departmental dose-optimization program which includes automated exposure control, adjustment of the mA and/or kV according to patient size  and/or use of iterative reconstruction technique. COMPARISON:  None Available. FINDINGS: CT CHEST FINDINGS Cardiovascular: Cardiomegaly. No pericardial effusion. Thoracic aorta is nonaneurysmal. Atherosclerotic calcifications of the aorta and coronary arteries. Central pulmonary vasculature is moderately dilated. Mediastinum/Nodes: No enlarged mediastinal, hilar, or axillary lymph nodes. Thyroid gland, trachea, and esophagus demonstrate no significant findings. Lungs/Pleura: Mild bibasilar subsegmental atelectasis. Lungs are otherwise clear. No pleural effusion or pneumothorax. Musculoskeletal: No acute osseous abnormality. Multilevel bridging anterior endplate osteophytes throughout the thoracic spine compatible with diffuse idiopathic skeletal hyperostosis. Gynecomastia is present on the left. CT ABDOMEN PELVIS FINDINGS Hepatobiliary: Unremarkable unenhanced appearance of the liver. There may be layering sludge within the gallbladder. No pericholecystic inflammatory changes by CT. No biliary dilatation. Pancreas: Unremarkable. No pancreatic ductal dilatation or surrounding inflammatory changes. Spleen: Normal in size without focal abnormality. Adrenals/Urinary Tract: Adrenal glands are unremarkable. Kidneys are normal, without renal calculi, focal lesion, or hydronephrosis. Bladder is unremarkable. Stomach/Bowel: Stomach is within normal limits. Scattered colonic diverticulosis. No evidence of bowel wall thickening, distention, or inflammatory changes. Vascular/Lymphatic: Aortic atherosclerosis. No enlarged abdominal or pelvic lymph nodes. Reproductive: Prostate is unremarkable. Other: No free fluid. No abdominopelvic fluid collection. No pneumoperitoneum. No abdominal wall hernia. Musculoskeletal: Skin thickening and subcutaneous edema of the  lower anterior abdominal wall/pannus. No fluid collections. No acute osseous abnormality. Appearance of the proximal right femur with coarsened trabecula suggestive of Paget's disease. IMPRESSION: 1. Skin thickening and subcutaneous edema of the lower anterior abdominal wall/pannus. Correlate for cellulitis. No fluid collections. 2. Otherwise, no acute intrathoracic or intra-abdominal findings. 3. Cardiomegaly with moderate dilatation of the central pulmonary vasculature, which can be seen in the setting of pulmonary hypertension. 4. Colonic diverticulosis without evidence of acute diverticulitis. 5. Appearance of the proximal right femur with coarsened trabecula suggestive of Paget's disease. 6. Aortic atherosclerosis (ICD10-I70.0). Electronically Signed   By: Duanne Guess D.O.   On: 07/24/2023 19:31   DG Chest 2 View  Result Date: 07/24/2023 CLINICAL DATA:  Shortness of breath. EXAM: CHEST - 2 VIEW COMPARISON:  None Available. FINDINGS: Limited exam. Low lung volume. Mild central vascular congestion, likely accentuated by low lung volume. No frank pulmonary edema. Bilateral lung fields are otherwise clear. No acute consolidation or lung collapse. Bilateral costophrenic angles are clear. Moderately enlarged cardio-mediastinal silhouette, which is accentuated by low lung volume and AP technique. No acute osseous abnormalities. The soft tissues are within normal limits. IMPRESSION: *Limited exam.  No active cardiopulmonary disease. Correlate clinically to determine the need for additional imaging with chest CT scan. Electronically Signed   By: Jules Schick M.D.   On: 07/24/2023 15:55    Cardiac Studies     Patient Profile     ary Levi [name] is a 67 y.o. male with no known prior medical history (has not been seen doctors in years)  who is being seen 07/25/2023 for the evaluation of new onset heart failure  at the request of Dr Wynona Neat.   Assessment & Plan    .Acute HFrEF/ RV failure - Echo: LVEF  <20%, global hypokinesis, grade I dd, severe RV dysfunction, PASP 29 - CXR limited visualizaiton, no clear acute process. BNP 798,    -Lactates have been normal. Coox yesterday 80%, pending this AM. MAPs 100s. CVP ordered but not done yesterday - negative 6 L yesterday, negative 7.9 L since admission. Weight 455-->444 lbs if accurate. He received IV lasix 80mg  x 3 yesterday, due for bid dosing today. Labs are pending - would consider RHC/LHC once extubated  and diuresed, more clinically stable. Unclear etiology of his HF at this time.      2.Hypercapneic/ hypoxic respiratory failure - pCO2 111 initially, marked respiraotry acidosis pH 7.08 - subsequent ABG on vent 7.29/65/114/31 - in setting of acute HF, BMI 71 so likely some OHS/restrictive lung disease as well.  - vent management per critical care       3. Elevated troponin - peak trop 371 in setting of respiratory failure on ventilator, marked respiratory acidosis pH 7.08.   EKG without acute ischemic changes - at this time suspect demand ishcemia and HF as the cause, not a typical pattern for ACS. Does not require anticoagulation. Would consider cath on low EF alone however once more medically stable.    4. NSVT - occurred in ER in setting of marked hypercapnea/hypoxia - no significant recurrences since respiratory status has stablized.  - keep K at 4, Mg at 2.      5. Renal dysfunction - unclear baseline, trending down with diuresis so likely component of venous congestion and HF as etiology - follow with further diuresis.      6. Elevated LFTs - being abdominal US - trending down with diuresis, likely secondary to congestive hepatopathy.   For questions or updates, please contact [facility] Please consult [email or web address] for contact info under        Signed, Dina Rich, MD  07/26/2023, 7:20 AM

## 2023-07-26 NOTE — Progress Notes (Signed)
Pt weaned 1 hr. 6 minutes on PSV 10/8, placed back on full support due to fatigue.

## 2023-07-26 NOTE — Progress Notes (Signed)
   07/26/23 0921  Airway 7.5 mm  Placement Date/Time: 07/24/23 2358   Difficult airway due to:: Difficult airway - due to edematous airway  Placed By: ICU physician  Airway Device: Endotracheal Tube  ETT Types: Oral  Size (mm): 7.5 mm  Cuffed: Cuffed  Airway Equipment: Video Laryngos...  Secured at (cm) 25 cm  Measured From Lips  Secured Location Center (tube was found on right)  Secured By English as a second language teacher No  Tube Holder Repositioned Yes  Prone position No  Cuff Pressure (cm H2O) MOV (Manual Technique)  Site Condition Dry  Adult Ventilator Settings  Vent Type Servo i  Humidity HME  Vent Mode (S)  PSV;CPAP  FiO2 (%) (S)  30 %  Pressure Support (S)  10 cmH20  PEEP (S)  8 cmH20  Adult Ventilator Measurements  Peak Airway Pressure 18 L/min  Mean Airway Pressure 11 cmH20  Resp Rate Spontaneous 15 br/min  Resp Rate Total 15 br/min  Exhaled Vt 319 mL  Measured Ve 5.2 L  Auto PEEP 0 cmH20  Total PEEP 8 cmH20  Adult Ventilator Alarms  Alarms On Y  Ve High Alarm 16 L/min  Ve Low Alarm 3 L/min  Resp Rate High Alarm 36 br/min  Resp Rate Low Alarm 8  PEEP Low Alarm 3 cmH2O  Press High Alarm 45 cmH2O  T Apnea 20 sec(s)  VAP Prevention  HOB> 30 Degrees Y  Equipment wiped down Yes  Daily Weaning Assessment  Daily Assessment of Readiness to Wean Wean protocol criteria met (SBT performed)  SBT Method CPAP 5 cm H20 and PS 5 cm H20 (10/8)  Breath Sounds  Bilateral Breath Sounds Clear;Diminished  Vent Respiratory Assessment  Level of Consciousness Responds to Voice  Respiratory Pattern Regular

## 2023-07-26 DEATH — deceased

## 2023-07-27 DIAGNOSIS — N179 Acute kidney failure, unspecified: Secondary | ICD-10-CM | POA: Diagnosis not present

## 2023-07-27 DIAGNOSIS — I5082 Biventricular heart failure: Secondary | ICD-10-CM | POA: Diagnosis not present

## 2023-07-27 DIAGNOSIS — E876 Hypokalemia: Secondary | ICD-10-CM

## 2023-07-27 DIAGNOSIS — N289 Disorder of kidney and ureter, unspecified: Secondary | ICD-10-CM

## 2023-07-27 DIAGNOSIS — J9601 Acute respiratory failure with hypoxia: Secondary | ICD-10-CM | POA: Diagnosis not present

## 2023-07-27 DIAGNOSIS — I700 Atherosclerosis of aorta: Secondary | ICD-10-CM

## 2023-07-27 DIAGNOSIS — I517 Cardiomegaly: Secondary | ICD-10-CM

## 2023-07-27 DIAGNOSIS — I5021 Acute systolic (congestive) heart failure: Secondary | ICD-10-CM

## 2023-07-27 DIAGNOSIS — J9602 Acute respiratory failure with hypercapnia: Secondary | ICD-10-CM

## 2023-07-27 DIAGNOSIS — I429 Cardiomyopathy, unspecified: Secondary | ICD-10-CM

## 2023-07-27 LAB — COMPREHENSIVE METABOLIC PANEL
ALT: 251 U/L — ABNORMAL HIGH (ref 0–44)
AST: 86 U/L — ABNORMAL HIGH (ref 15–41)
Albumin: 3 g/dL — ABNORMAL LOW (ref 3.5–5.0)
Alkaline Phosphatase: 124 U/L (ref 38–126)
Anion gap: 12 (ref 5–15)
BUN: 66 mg/dL — ABNORMAL HIGH (ref 8–23)
CO2: 33 mmol/L — ABNORMAL HIGH (ref 22–32)
Calcium: 8.5 mg/dL — ABNORMAL LOW (ref 8.9–10.3)
Chloride: 94 mmol/L — ABNORMAL LOW (ref 98–111)
Creatinine, Ser: 1.51 mg/dL — ABNORMAL HIGH (ref 0.61–1.24)
GFR, Estimated: 50 mL/min — ABNORMAL LOW (ref >60)
Glucose, Bld: 122 mg/dL — ABNORMAL HIGH (ref 70–99)
Potassium: 3.4 mmol/L — ABNORMAL LOW (ref 3.5–5.1)
Sodium: 139 mmol/L (ref 135–145)
Total Bilirubin: 3.7 mg/dL — ABNORMAL HIGH (ref <1.2)
Total Protein: 6.6 g/dL (ref 6.5–8.1)

## 2023-07-27 LAB — MAGNESIUM
Magnesium: 1.9 mg/dL (ref 1.7–2.4)
Magnesium: 1.9 mg/dL (ref 1.7–2.4)

## 2023-07-27 LAB — PHOSPHORUS
Phosphorus: 2.9 mg/dL (ref 2.5–4.6)
Phosphorus: 3.4 mg/dL (ref 2.5–4.6)

## 2023-07-27 LAB — GLUCOSE, CAPILLARY
Glucose-Capillary: 134 mg/dL — ABNORMAL HIGH (ref 70–99)
Glucose-Capillary: 144 mg/dL — ABNORMAL HIGH (ref 70–99)
Glucose-Capillary: 152 mg/dL — ABNORMAL HIGH (ref 70–99)
Glucose-Capillary: 156 mg/dL — ABNORMAL HIGH (ref 70–99)
Glucose-Capillary: 157 mg/dL — ABNORMAL HIGH (ref 70–99)
Glucose-Capillary: 159 mg/dL — ABNORMAL HIGH (ref 70–99)

## 2023-07-27 MED ORDER — ASPIRIN 81 MG PO CHEW
324.0000 mg | CHEWABLE_TABLET | Freq: Every day | ORAL | Status: DC
  Administered 2023-07-27: 324 mg
  Filled 2023-07-27: qty 4

## 2023-07-27 MED ORDER — ORAL CARE MOUTH RINSE
15.0000 mL | OROMUCOSAL | Status: DC
  Administered 2023-07-27 – 2023-07-31 (×53): 15 mL via OROMUCOSAL

## 2023-07-27 MED ORDER — METOLAZONE 5 MG PO TABS
5.0000 mg | ORAL_TABLET | Freq: Once | ORAL | Status: AC
  Administered 2023-07-27: 5 mg
  Filled 2023-07-27: qty 1

## 2023-07-27 MED ORDER — PIVOT 1.5 CAL PO LIQD
1000.0000 mL | ORAL | Status: DC
  Administered 2023-07-27 – 2023-07-30 (×4): 1000 mL
  Filled 2023-07-27 (×3): qty 1000

## 2023-07-27 MED ORDER — ALBUMIN HUMAN 25 % IV SOLN
25.0000 g | Freq: Once | INTRAVENOUS | Status: AC
  Administered 2023-07-27: 25 g via INTRAVENOUS
  Filled 2023-07-27: qty 100

## 2023-07-27 MED ORDER — POTASSIUM CHLORIDE 20 MEQ PO PACK
40.0000 meq | PACK | Freq: Once | ORAL | Status: AC
  Administered 2023-07-27: 40 meq
  Filled 2023-07-27: qty 2

## 2023-07-27 MED ORDER — ORAL CARE MOUTH RINSE
15.0000 mL | OROMUCOSAL | Status: DC | PRN
Start: 2023-07-27 — End: 2023-08-03
  Administered 2023-08-01: 15 mL via OROMUCOSAL

## 2023-07-27 MED ORDER — FUROSEMIDE 10 MG/ML IJ SOLN
40.0000 mg | Freq: Two times a day (BID) | INTRAMUSCULAR | Status: DC
  Administered 2023-07-27 – 2023-07-29 (×3): 40 mg via INTRAVENOUS
  Filled 2023-07-27 (×3): qty 4

## 2023-07-27 MED ORDER — PROSOURCE TF20 ENFIT COMPATIBL EN LIQD
60.0000 mL | Freq: Three times a day (TID) | ENTERAL | Status: DC
  Administered 2023-07-27 – 2023-07-31 (×11): 60 mL
  Filled 2023-07-27 (×11): qty 60

## 2023-07-27 NOTE — Progress Notes (Signed)
NAME:  Levi [name], MRN:  [id number], DOB:  21-Aug-1956, LOS: 3 ADMISSION DATE:  07/24/2023, CONSULTATION DATE: 07/24/2023  REFERRING MD: Marlene Bast, CHIEF COMPLAINT: Shortness of breath and hypoxia  History of Present Illness:  A 67 yr old male patient with morbid obesity, probably untreated OSA/OHS, and knee OA, who presented to ED with dry cough, DOE, SOB, LL edema, orthopnea for 1-2 weeks. Home health RN found his SpO2 64% on RA. Given a dose of Albuterol. Denied wheezing, CP, N/V/D, abd pain, f/c/r, and rash. Not on home O2 or NIV.  No smoking, illicit drug use, or alcoholism.  No hx of COPD, asthma, VTE, cardiac hx, liver disease, or CKD Developed runs of VT in ED. No EKG changes initially.   Pertinent  Medical History  History reviewed. No pertinent past medical history.  Significant Hospital Events: Including procedures, antibiotic start and stop dates in addition to other pertinent events   11/29 admitted  Interim History / Subjective:  NAEON. -11L net since admit, almost 4L last 24 Became hypoxic this AM, was on 30% FiO2 and 8 PEEP. Increased to 40% No SBT yet  Objective   Blood pressure 122/66, pulse 66, temperature 99.8 F (37.7 C), temperature source Axillary, resp. rate 20, height 5' 7.01" (1.702 m), weight (!) 201.4 kg, SpO2 93%. CVP:  [7 mmHg-14 mmHg] 8 mmHg  Vent Mode: PRVC FiO2 (%):  [30 %] 30 % Set Rate:  [20 bmp] 20 bmp Vt Set:  [500 mL] 500 mL PEEP:  [8 cmH20] 8 cmH20 Pressure Support:  [10 cmH20] 10 cmH20 Plateau Pressure:  [21 cmH20-22 cmH20] 21 cmH20   Intake/Output Summary (Last 24 hours) at 07/27/2023 0826 Last data filed at 07/27/2023 0800 Gross per 24 hour  Intake 1790.04 ml  Output 3350 ml  Net -1559.96 ml   Filed Weights   07/25/23 0530 07/26/23 0500 07/27/23 0500  Weight: (!) 206.4 kg (!) 201.5 kg (!) 201.4 kg    Examination: General: Adult male, morbidly obese HENT: Endotracheal tube in place, [place]/AT Lungs: Normal effort.  Decreased air movement bilaterally Cardiovascular: RRR, no M/R/G Abdomen: Obese, bowel sounds appreciated Extremities: 1+ edema bilaterally Neuro: Sedated, not following commands  Assessment & Plan:   Decompensated congestive heart failure - echo from 11/30 with EF <20%, G1DD, severely reduced RVSF, severely enlarged RV -Continue Lasix every 12, drop from 80mg  to 40mg . Currenty -10L net since admit -Add Metolazone x 1 today -Albumin 25g x 1 -Cardiology following, appreciate the assistance -Considering R/LHC once extubated -Would get him established with AHF clinic  Acute Hypoxic and Hypercapnic respiratory failure Presumed OSA/OHS -Continue mechanical ventilation -Target TVol 6-8cc/kgIBW -Target Plateau Pressure < 30cm H20 -Target driving pressure less than 15 cm of water -Target PaO2 55-65: titrate PEEP/FiO2 per protocol -Ventilator associated pneumonia prevention protocol -Would recommend sleep study as outpatient, likely needs nocturnal CPAP  Did have runs of V. tach when he came in - resolved -Did receive metoprolol, electrolytes corrected  Acute kidney injury Slight contraction alkalosis 2/2 aggressive diuresis Hypokalemia -Diuresis as above, decrease given slight contraction now - K -Continue to maintain renal perfusion -Trend renal parameters  Congestive hepatopathy -Continue supportive care   Best Practice (right click and "Reselect all SmartList Selections" daily)   Diet/type: tubefeeds DVT prophylaxis: heparin injection 5,000 Units Start: 07/25/23 1400 SCDs Start: 07/24/23 2257  Pressure ulcer(s): not present on admission  GI prophylaxis: H2B Lines: Central line Foley:  Yes, and it is still needed Code Status:  full code  Last date of multidisciplinary goals of care discussion [discussed with patient's spouse and brother at bedside 12/1]   CC time: 35 min.   Rutherford Guys, PA - [facility] For pager details,  please see AMION or use Epic chat  After 1900, please call [facility] for cross coverage needs 07/27/2023, 8:34 AM

## 2023-07-27 NOTE — Progress Notes (Signed)
Initial Nutrition Assessment  DOCUMENTATION CODES:   Morbid obesity  INTERVENTION:   -Advance Pivot 1.5 to 40 ml/hr via NGT -Advance Prosource TF 20 TID -Provides 1680 kcals, 150g protein and 720 ml H2O  NUTRITION DIAGNOSIS:   Increased nutrient needs related to chronic illness as evidenced by estimated needs.  GOAL:   Patient will meet greater than or equal to 90% of their needs  MONITOR:   Labs, Weight trends, I & O's, Vent status  REASON FOR ASSESSMENT:   Consult, Ventilator Enteral/tube feeding initiation and management, Assessment of nutrition requirement/status  ASSESSMENT:   67 y.o. male with medical history significant of obesity. Presented with  hypoxia.  Patient presents with cough and shortness of breath since last week was given a dose of albuterol's found to be satting 64% on room air felt to have COPD exacerbation reports shortness of breath has been ongoing for at least 2 weeks.  11/29: admitted 11/30: intubated  Patient currently sedated.  Per chart review, has had SOB for 2 weeks. Pt with NGT, Pivot 1.5 was started over the weekend at 30 ml/hr with Prosource TF 20 daily. Will increase to better meet increased needs.  Patient is currently intubated on ventilator support MV: 7.3 L/min Temp (24hrs), Avg:98.9 F (37.2 C), Min:98.2 F (36.8 C), Max:100 F (37.8 C) MAP: 79  Admission weight: 455 lbs Current weight: 444 lbs  Medications: Lasix, KLOR-CON, Precedex, Pepcid, Fentanyl  Labs reviewed: CBGs: 104-156 Low K  NUTRITION - FOCUSED PHYSICAL EXAM:  No depletions noted. Mild-moderate edema BUE and BLEs.  Diet Order:   Diet Order             Diet NPO time specified Except for: Sips with Meds  Diet effective now                   EDUCATION NEEDS:   No education needs have been identified at this time  Skin:  Skin Assessment: Skin Integrity Issues: Skin Integrity Issues:: Other (Comment) Other: ID abdomen  Last BM:   11/29  Height:   Ht Readings from Last 1 Encounters:  07/25/23 5' 7.01" (1.702 m)    Weight:   Wt Readings from Last 1 Encounters:  07/27/23 (!) 201.4 kg   BMI:  Body mass index is 69.52 kg/m.  Estimated Nutritional Needs:   Kcal:  1700-2000  Protein:  150-160g  Fluid:  2L/day   Tilda Franco, MS, RD, LDN Inpatient Clinical Dietitian Contact information available via Amion

## 2023-07-27 NOTE — Progress Notes (Addendum)
[name] Date of Encounter: 07/27/2023 [facility] Cardiologist:new to Dr Wyline Mood   Interval Summary  .    67 yr old male with no known PMH presented to ER for weeks of SOB, orthopnea, LE edema. He was admitted to ICU for acute hypoxic respiratory failure, felt to be in anasarca.  He was placed on BiPAP and ultimately required mechanical ventilation.  Cardiology consulted since 07/25/2023 for acute onset CHF.  BNP 798.  Lactic acid has been normal.  Echo from 07/25/2023 with LVEF <20 percent, global hypokinesis, mild concentric LVH, grade 1 DD, severely reduced RV, severely enlarged RV, PASP 29.4 mmHg, trivial AI, mild dilatation of ascending aorta 41 mm, IVC dilated.  He is receiving IV diuresis with Lasix.  Elevated troponin with peak at 371 felt due to demand ischemia.   Patient remains on vent, sedated, unable obtain history.   Vital Signs .    Vitals:   07/27/23 1000 07/27/23 1100 07/27/23 1121 07/27/23 1200  BP: 117/61 (!) 116/59  118/62  Pulse: 72 69  68  Resp: 20 20  20   Temp:   100 F (37.8 C)   TempSrc:   Axillary   SpO2: 96% 97%  98%  Weight:      Height:        Intake/Output Summary (Last 24 hours) at 07/27/2023 1445 Last data filed at 07/27/2023 1200 Gross per 24 hour  Intake 2025.42 ml  Output 2175 ml  Net -149.58 ml      07/27/2023    5:00 AM 07/26/2023    5:00 AM 07/25/2023    5:30 AM  Last 3 Weights  Weight (lbs) 444 lb 444 lb 3.2 oz 455 lb 0.5 oz  Weight (kg) 201.397 kg 201.488 kg 206.4 kg      Telemetry/ECG    Sinus rhythm, occasional PVCs - Personally Reviewed  Physical Exam .   GEN: Sedated on fentanyl and precedex gtt  Neck: unable assess JVD Cardiac: RRR, no murmurs, rubs, or gallops.  Respiratory: Clear to auscultation bilaterally. On ventilator 40% FIO2, PEEP 5, TV500, RR 20.  GI: NG in place, feeds infusing  MS: No leg edema, SCDs in place  Foley with amber color urine Left internal jugular CVL clean dressing  in place    Assessment & Plan .     Acute systolic heart failure, new diagnosis this admission -Presented with acute hypoxic respiratory distress, weeks of shortness of breath, orthopnea, leg edema -BNP 798 -High sensitive troponin 156 >371 >302 -EKG without acute ischemic findings -CT torso without on 11/29 with skin thickening and subcutaneous edema of lower anterior abdominal wall/pannus, no acute intrathoracic or intra-abdominal finding, cardiomegaly with moderate dilatation of central pulmonary vasculature likely pulmonary hypertension, diverticulosis,?  Paget's disease, aortic atherosclerosis -Echocardiogram from 07/25/2023 with LVEF <20 %, global hypokinesis, mild concentric LVH, grade 1 DD, severely reduced RV, severely enlarged RV, PASP 29.4 mmHg, trivial AI, mild dilatation of ascending aorta 41 mm, IVC dilated. -Etiology of cardiomyopathy unclear at this time -lactic acid x3 WNL, COOX  80% >>74% yesterday, not in low output  - on IV Lasix, Net - 11L so far, weight down from 455.03 >444 IBS since admission, AKI and transaminitis on lab improving today, suspect clinical picture is consistent with cardiorenal syndrome, continue IV diuresis at current dose, track intake and output, daily weight -Plan for left and right heart catheterization once patient is extubated and stable -GDMT: Plan to initiate Entresto, spironolactone, Jardiance once AKI  resolves; hold all beta-blocker until he is more euvolemic  For questions or updates, please contact [facility] Please consult [email or web address] for contact info under   Signed, Cyndi Bender, NP   ADDENDUM:   Patient seen and examined with Cyndi Bender, NP .  I personally taken a history, examined the patient, reviewed relevant notes,  laboratory data / imaging studies.  I performed a substantive portion of this encounter and formulated the important aspects of the plan.  I agree with the APP's note, impression, and recommendations;  however, I have edited the note to reflect changes or salient points.  Patient is currently intubated and sedated Not on pressor support. CVP 7 mmHg. No family at bedside  PHYSICAL EXAM: Today's Vitals   07/27/23 1649 07/27/23 1700 07/27/23 1800 07/27/23 1808  BP:  (!) 104/58 (!) 111/58   Pulse: 69 69 69 90  Resp: 20 20 20  (!) 24  Temp:      TempSrc:      SpO2: 94% 94% 93% (!) 87%  Weight:      Height:      PainSc:       Body mass index is 69.52 kg/m.   Net IO Since Admission: -11,596.95 mL [07/27/23 1928]  Filed Weights   07/25/23 0530 07/26/23 0500 07/27/23 0500  Weight: (!) 206.4 kg (!) 201.5 kg (!) 201.4 kg    Physical Exam  Constitutional: No distress. He appears acutely ill.  hemodynamically stable, appears older than stated age  HENT:  OG tube in place. ET tube in place.  Neck: JVD: Difficult evaluate JVP secondary to short neck stature and adipose tissue.  Left IJ in place  Cardiovascular: Normal rate, regular rhythm, S1 normal and S2 normal. Exam reveals no gallop, no S3 and no S4.  No murmur heard. Pulses:      Dorsalis pedis pulses are 2+ on the right side and 2+ on the left side.       Posterior tibial pulses are 2+ on the right side and 2+ on the left side.  Pulmonary/Chest: No stridor. He exhibits no tenderness.  Mechanical breath sounds bilaterally.  Abdominal: Soft. Bowel sounds are normal. He exhibits no distension. There is no abdominal tenderness.  Musculoskeletal:        General: No edema.     Cervical back: Neck supple.  Neurological: He is alert and oriented to person, place, and time. He has intact cranial nerves (2-12).  Skin: Skin is warm.    EKG: (personally reviewed by me) No new EKGs  Telemetry: (personally reviewed by me) Sinus rhythm with occasional PVCs.   Impression / Recommendations:  Acute biventricular heart failure Acute HFrEF Cardiomyopathy, unspecified.  Newly discovered. Stage C, NYHA class III Currently intubated  and sedated with minimal vent support, not on pressors or inotropic support Serum creatinine levels are improving. CVP 7 mmHg. AST and ALT improving. High sensitive troponins peaked at 371 LDL calculated 87 mg/dL Lactic acid levels have remained within normal limits Tentative plan is to extubate tomorrow 07/28/2023. Continue Lasix 40 mg IV push twice daily.  Net IO Since Admission: -11,596.95 mL [07/27/23 1936] Once extubated will uptitrate GDMT as hemodynamics and laboratory values allow. Once extubated we will discuss ischemic workup-likely would benefit from left and right heart catheterization given the acute biventricular heart failure.  Hypercapnic/hypoxic respiratory failure pCO2 111 initially Currently on vent support BMI on arrival 71 Plan to extubate tomorrow, per nursing staff. Recommend outpatient sleep apnea evaluation  Acute renal insufficiency:  Baseline unknown. Serum creatinine downtrending. Once serum creatinine levels have stabilized recommend heart catheterization as discussed above  Hypokalemia: Replacement per ICU protocol.  Further recommendations to follow as the case evolves.   This note was created using a voice recognition software as a result there may be grammatical errors inadvertently enclosed that do not reflect the nature of this encounter. Every attempt is made to correct such errors.   Tessa Lerner, DO, [facility]  [address] [place], [place] [address] Pager: [phone] Office: [phone] 07/27/2023 7:28 PM

## 2023-07-27 NOTE — Plan of Care (Signed)
  Problem: Fluid Volume: Goal: Ability to maintain a balanced intake and output will improve Outcome: Progressing   Problem: Education: Goal: Knowledge of General Education information will improve Description: Including pain rating scale, medication(s)/side effects and non-pharmacologic comfort measures Outcome: Progressing   Spouse at bedside.

## 2023-07-28 ENCOUNTER — Inpatient Hospital Stay (HOSPITAL_COMMUNITY): Payer: Medicare HMO

## 2023-07-28 DIAGNOSIS — I5021 Acute systolic (congestive) heart failure: Secondary | ICD-10-CM | POA: Diagnosis not present

## 2023-07-28 DIAGNOSIS — J189 Pneumonia, unspecified organism: Secondary | ICD-10-CM

## 2023-07-28 DIAGNOSIS — I4891 Unspecified atrial fibrillation: Secondary | ICD-10-CM

## 2023-07-28 DIAGNOSIS — I5082 Biventricular heart failure: Secondary | ICD-10-CM

## 2023-07-28 DIAGNOSIS — R609 Edema, unspecified: Secondary | ICD-10-CM

## 2023-07-28 DIAGNOSIS — R569 Unspecified convulsions: Secondary | ICD-10-CM

## 2023-07-28 DIAGNOSIS — I429 Cardiomyopathy, unspecified: Secondary | ICD-10-CM | POA: Diagnosis not present

## 2023-07-28 DIAGNOSIS — I480 Paroxysmal atrial fibrillation: Secondary | ICD-10-CM | POA: Insufficient documentation

## 2023-07-28 DIAGNOSIS — J9601 Acute respiratory failure with hypoxia: Secondary | ICD-10-CM | POA: Diagnosis not present

## 2023-07-28 LAB — CBC
HCT: 50.3 % (ref 39.0–52.0)
Hemoglobin: 15.8 g/dL (ref 13.0–17.0)
MCH: 26 pg (ref 26.0–34.0)
MCHC: 31.4 g/dL (ref 30.0–36.0)
MCV: 82.9 fL (ref 80.0–100.0)
Platelets: 141 10*3/uL — ABNORMAL LOW (ref 150–400)
RBC: 6.07 MIL/uL — ABNORMAL HIGH (ref 4.22–5.81)
RDW: 18.4 % — ABNORMAL HIGH (ref 11.5–15.5)
WBC: 9.8 10*3/uL (ref 4.0–10.5)
nRBC: 0 % (ref 0.0–0.2)

## 2023-07-28 LAB — GLUCOSE, CAPILLARY
Glucose-Capillary: 186 mg/dL — ABNORMAL HIGH (ref 70–99)
Glucose-Capillary: 151 mg/dL — ABNORMAL HIGH (ref 70–99)
Glucose-Capillary: 167 mg/dL — ABNORMAL HIGH (ref 70–99)
Glucose-Capillary: 134 mg/dL — ABNORMAL HIGH (ref 70–99)
Glucose-Capillary: 137 mg/dL — ABNORMAL HIGH (ref 70–99)
Glucose-Capillary: 150 mg/dL — ABNORMAL HIGH (ref 70–99)

## 2023-07-28 LAB — COMPREHENSIVE METABOLIC PANEL
ALT: 161 U/L — ABNORMAL HIGH (ref 0–44)
AST: 49 U/L — ABNORMAL HIGH (ref 15–41)
Albumin: 3 g/dL — ABNORMAL LOW (ref 3.5–5.0)
Alkaline Phosphatase: 98 U/L (ref 38–126)
Anion gap: 11 (ref 5–15)
BUN: 77 mg/dL — ABNORMAL HIGH (ref 8–23)
CO2: 36 mmol/L — ABNORMAL HIGH (ref 22–32)
Calcium: 8.5 mg/dL — ABNORMAL LOW (ref 8.9–10.3)
Chloride: 96 mmol/L — ABNORMAL LOW (ref 98–111)
Creatinine, Ser: 1.52 mg/dL — ABNORMAL HIGH (ref 0.61–1.24)
GFR, Estimated: 50 mL/min — ABNORMAL LOW (ref >60)
Glucose, Bld: 168 mg/dL — ABNORMAL HIGH (ref 70–99)
Potassium: 3.7 mmol/L (ref 3.5–5.1)
Sodium: 143 mmol/L (ref 135–145)
Total Bilirubin: 3 mg/dL — ABNORMAL HIGH (ref <1.2)
Total Protein: 6.8 g/dL (ref 6.5–8.1)

## 2023-07-28 LAB — PROCALCITONIN: Procalcitonin: 2.96 ng/mL

## 2023-07-28 LAB — BASIC METABOLIC PANEL
Anion gap: 7 (ref 5–15)
BUN: 79 mg/dL — ABNORMAL HIGH (ref 8–23)
CO2: 34 mmol/L — ABNORMAL HIGH (ref 22–32)
Calcium: 7.7 mg/dL — ABNORMAL LOW (ref 8.9–10.3)
Chloride: 98 mmol/L (ref 98–111)
Creatinine, Ser: 1.95 mg/dL — ABNORMAL HIGH (ref 0.61–1.24)
GFR, Estimated: 37 mL/min — ABNORMAL LOW (ref >60)
Glucose, Bld: 143 mg/dL — ABNORMAL HIGH (ref 70–99)
Potassium: 3.7 mmol/L (ref 3.5–5.1)
Sodium: 139 mmol/L (ref 135–145)

## 2023-07-28 LAB — MRSA NEXT GEN BY PCR, NASAL: MRSA by PCR Next Gen: NOT DETECTED

## 2023-07-28 LAB — BRAIN NATRIURETIC PEPTIDE: B Natriuretic Peptide: 380 pg/mL — ABNORMAL HIGH (ref 0.0–100.0)

## 2023-07-28 MED ORDER — MIDAZOLAM HCL 2 MG/2ML IJ SOLN
1.0000 mg | INTRAMUSCULAR | Status: DC | PRN
  Administered 2023-07-29 – 2023-07-30 (×2): 2 mg via INTRAVENOUS
  Filled 2023-07-28 (×2): qty 2

## 2023-07-28 MED ORDER — VANCOMYCIN HCL 10 G IV SOLR
2500.0000 mg | Freq: Once | INTRAVENOUS | Status: DC
  Filled 2023-07-28: qty 25

## 2023-07-28 MED ORDER — SODIUM CHLORIDE 3 % IN NEBU
4.0000 mL | INHALATION_SOLUTION | Freq: Four times a day (QID) | RESPIRATORY_TRACT | Status: DC
  Administered 2023-07-28 (×3): 4 mL via RESPIRATORY_TRACT
  Filled 2023-07-28 (×5): qty 4

## 2023-07-28 MED ORDER — SODIUM CHLORIDE 0.9 % IV SOLN: 0.4000 ug/kg/h | INTRAVENOUS | Status: DC

## 2023-07-28 MED ORDER — LEVETIRACETAM IN NACL 1000 MG/100ML IV SOLN
1000.0000 mg | Freq: Two times a day (BID) | INTRAVENOUS | Status: DC
  Administered 2023-07-29: 1000 mg via INTRAVENOUS
  Filled 2023-07-28 (×2): qty 100

## 2023-07-28 MED ORDER — AMIODARONE HCL IN DEXTROSE 360-4.14 MG/200ML-% IV SOLN
30.0000 mg/h | INTRAVENOUS | Status: DC
  Filled 2023-07-28: qty 200

## 2023-07-28 MED ORDER — VANCOMYCIN HCL 2000 MG/400ML IV SOLN
2000.0000 mg | INTRAVENOUS | Status: DC
  Filled 2023-07-28: qty 400

## 2023-07-28 MED ORDER — VANCOMYCIN HCL IN DEXTROSE 1-5 GM/200ML-% IV SOLN
1000.0000 mg | Freq: Once | INTRAVENOUS | Status: AC
  Administered 2023-07-28: 1000 mg via INTRAVENOUS
  Filled 2023-07-28: qty 200

## 2023-07-28 MED ORDER — HEPARIN (PORCINE) 25000 UT/250ML-% IV SOLN
2700.0000 [IU]/h | INTRAVENOUS | Status: DC
  Administered 2023-07-28 – 2023-07-30 (×4): 1650 [IU]/h via INTRAVENOUS
  Administered 2023-07-31: 1850 [IU]/h via INTRAVENOUS
  Administered 2023-08-01 – 2023-08-02 (×3): 1900 [IU]/h via INTRAVENOUS
  Administered 2023-08-02: 2000 [IU]/h via INTRAVENOUS
  Administered 2023-08-03: 2700 [IU]/h via INTRAVENOUS
  Administered 2023-08-03: 2300 [IU]/h via INTRAVENOUS
  Filled 2023-07-28 (×11): qty 250

## 2023-07-28 MED ORDER — VANCOMYCIN HCL 1.5 G IV SOLR
1500.0000 mg | Freq: Once | INTRAVENOUS | Status: AC
  Administered 2023-07-28: 1500 mg via INTRAVENOUS
  Filled 2023-07-28: qty 30

## 2023-07-28 MED ORDER — PIPERACILLIN-TAZOBACTAM 3.375 G IVPB
3.3750 g | Freq: Three times a day (TID) | INTRAVENOUS | Status: DC
  Administered 2023-07-28 – 2023-07-30 (×6): 3.375 g via INTRAVENOUS
  Filled 2023-07-28 (×6): qty 50

## 2023-07-28 MED ORDER — DEXMEDETOMIDINE HCL IN NACL 200 MCG/50ML IV SOLN
0.0000 ug/kg/h | INTRAVENOUS | Status: DC
  Administered 2023-07-28: 1.1 ug/kg/h via INTRAVENOUS
  Filled 2023-07-28: qty 50

## 2023-07-28 MED ORDER — AMIODARONE LOAD VIA INFUSION
150.0000 mg | Freq: Once | INTRAVENOUS | Status: DC
  Filled 2023-07-28: qty 83.34

## 2023-07-28 MED ORDER — ALBUTEROL SULFATE (2.5 MG/3ML) 0.083% IN NEBU
2.5000 mg | INHALATION_SOLUTION | Freq: Four times a day (QID) | RESPIRATORY_TRACT | Status: DC | PRN
  Administered 2023-07-28 – 2023-08-03 (×3): 2.5 mg via RESPIRATORY_TRACT
  Filled 2023-07-28 (×3): qty 3

## 2023-07-28 MED ORDER — ALBUTEROL SULFATE (2.5 MG/3ML) 0.083% IN NEBU: 2.5000 mg | INHALATION_SOLUTION | RESPIRATORY_TRACT | Status: DC | PRN

## 2023-07-28 MED ORDER — AMIODARONE HCL IN DEXTROSE 360-4.14 MG/200ML-% IV SOLN
30.0000 mg/h | INTRAVENOUS | Status: DC
Start: 2023-07-29 — End: 2023-07-31
  Administered 2023-07-29 – 2023-07-31 (×5): 30 mg/h via INTRAVENOUS
  Filled 2023-07-28 (×4): qty 200

## 2023-07-28 MED ORDER — ASPIRIN 81 MG PO CHEW
81.0000 mg | CHEWABLE_TABLET | Freq: Every day | ORAL | Status: DC
  Administered 2023-07-28 – 2023-08-08 (×11): 81 mg
  Filled 2023-07-28 (×11): qty 1

## 2023-07-28 MED ORDER — AMIODARONE LOAD VIA INFUSION
150.0000 mg | Freq: Once | INTRAVENOUS | Status: AC
  Administered 2023-07-28: 150 mg via INTRAVENOUS
  Filled 2023-07-28: qty 83.34

## 2023-07-28 MED ORDER — MAGNESIUM SULFATE 4 GM/100ML IV SOLN
4.0000 g | Freq: Once | INTRAVENOUS | Status: AC
  Administered 2023-07-28: 4 g via INTRAVENOUS
  Filled 2023-07-28: qty 100

## 2023-07-28 MED ORDER — DEXMEDETOMIDINE HCL-DEXTROSE 400MCG/100ML -5% IV SOLN: 0.4000 ug/kg/h | INTRAVENOUS | Status: DC

## 2023-07-28 MED ORDER — SODIUM CHLORIDE 0.9 % IV SOLN
2000.0000 mg | Freq: Once | INTRAVENOUS | Status: AC
  Administered 2023-07-28: 2000 mg via INTRAVENOUS
  Filled 2023-07-28: qty 20

## 2023-07-28 MED ORDER — MIDAZOLAM HCL 2 MG/2ML IJ SOLN: 4.0000 mg | Freq: Once | INTRAMUSCULAR | Status: DC | PRN

## 2023-07-28 MED ORDER — DEXMEDETOMIDINE HCL IN NACL 400 MCG/100ML IV SOLN
0.0000 ug/kg/h | INTRAVENOUS | Status: DC
  Administered 2023-07-28 (×3): 1.1 ug/kg/h via INTRAVENOUS
  Administered 2023-07-29: 1.2 ug/kg/h via INTRAVENOUS
  Administered 2023-07-29 (×3): 1.1 ug/kg/h via INTRAVENOUS
  Administered 2023-07-29: 1.2 ug/kg/h via INTRAVENOUS
  Administered 2023-07-29: 0.8 ug/kg/h via INTRAVENOUS
  Administered 2023-07-29: 1.2 ug/kg/h via INTRAVENOUS
  Administered 2023-07-29: 0.4 ug/kg/h via INTRAVENOUS
  Administered 2023-07-29: 1.1 ug/kg/h via INTRAVENOUS
  Administered 2023-07-29 (×2): 1.2 ug/kg/h via INTRAVENOUS
  Administered 2023-07-30: 1.1 ug/kg/h via INTRAVENOUS
  Administered 2023-07-30: 0.9 ug/kg/h via INTRAVENOUS
  Administered 2023-07-30: 0.6 ug/kg/h via INTRAVENOUS
  Administered 2023-07-30: 1.1 ug/kg/h via INTRAVENOUS
  Administered 2023-07-30: 0.7 ug/kg/h via INTRAVENOUS
  Administered 2023-07-30: 0.6 ug/kg/h via INTRAVENOUS
  Administered 2023-07-30: 1.1 ug/kg/h via INTRAVENOUS
  Administered 2023-07-30: 1.2 ug/kg/h via INTRAVENOUS
  Administered 2023-07-30: 0.9 ug/kg/h via INTRAVENOUS
  Administered 2023-07-30 – 2023-07-31 (×4): 1.2 ug/kg/h via INTRAVENOUS
  Administered 2023-07-31 (×3): 1.1 ug/kg/h via INTRAVENOUS
  Administered 2023-07-31: 0.6 ug/kg/h via INTRAVENOUS
  Filled 2023-07-28: qty 100
  Filled 2023-07-28: qty 200
  Filled 2023-07-28 (×7): qty 100
  Filled 2023-07-28: qty 500
  Filled 2023-07-28: qty 100
  Filled 2023-07-28 (×2): qty 200
  Filled 2023-07-28: qty 300
  Filled 2023-07-28: qty 200
  Filled 2023-07-28 (×6): qty 100

## 2023-07-28 MED ORDER — MAGNESIUM SULFATE 2 GM/50ML IV SOLN: 2.0000 g | Freq: Once | INTRAVENOUS | Status: DC

## 2023-07-28 MED ORDER — AMIODARONE HCL IN DEXTROSE 360-4.14 MG/200ML-% IV SOLN
60.0000 mg/h | INTRAVENOUS | Status: DC
  Administered 2023-07-28 (×2): 60 mg/h via INTRAVENOUS
  Filled 2023-07-28 (×2): qty 200

## 2023-07-28 MED ORDER — ACETAZOLAMIDE 250 MG PO TABS
250.0000 mg | ORAL_TABLET | Freq: Two times a day (BID) | ORAL | Status: AC
  Administered 2023-07-28: 250 mg via ORAL
  Filled 2023-07-28: qty 1

## 2023-07-28 MED ORDER — AMIODARONE HCL IN DEXTROSE 360-4.14 MG/200ML-% IV SOLN: 60.0000 mg/h | INTRAVENOUS | Status: DC

## 2023-07-28 MED ORDER — POTASSIUM CHLORIDE 20 MEQ PO PACK
40.0000 meq | PACK | Freq: Once | ORAL | Status: AC
  Administered 2023-07-28: 40 meq
  Filled 2023-07-28: qty 2

## 2023-07-28 MED ORDER — SODIUM CHLORIDE 0.9 % IV SOLN
0.4000 ug/kg/h | INTRAVENOUS | Status: DC
  Administered 2023-07-28 (×2): 1.1 ug/kg/h via INTRAVENOUS
  Filled 2023-07-28 (×3): qty 4

## 2023-07-28 NOTE — Progress Notes (Signed)
Pharmacy Antibiotic Note  Levi [name] is a 67 y.o. male admitted on 07/24/2023 with decompensated CHF.  Pharmacy has been consulted today for Vancomycin, Zosyn dosing for suspected pneumonia due to increased respiratory secretions.    Plan: Zosyn 3.375g IV Q8H infused over 4hrs.  Vancomycin 2500mg  IV x1 then 2000 mg IV q24h  (SCr 1.5, Vd 0.5, est AUC 500) Measure Vanc levels as needed.  Goal AUC = 400 - 550  Follow up renal function, culture results, and clinical course.   Height: 5' 7.01" (170.2 cm) Weight: (!) 197.4 kg (435 lb 3.2 oz) IBW/kg (Calculated) : 66.12  Temp (24hrs), Avg:99.6 F (37.6 C), Min:98.7 F (37.1 C), Max:102.3 F (39.1 C)  Recent Labs  Lab 07/24/23 1815 07/24/23 1822 07/24/23 2024 07/24/23 2049 07/24/23 2248 07/24/23 2325 07/25/23 0805 07/26/23 0816 07/26/23 1044 07/27/23 0500 07/28/23 0400  WBC 11.5*  --   --   --   --   --  8.2  --  10.8*  --  9.8  CREATININE  --   --    < >  --  3.10*  --  2.77* 1.83*  --  1.51* 1.52*  LATICACIDVEN  --  1.3  --  1.5  --  1.6 1.3  --   --   --   --    < > = values in this interval not displayed.    Estimated Creatinine Clearance: 78 mL/min (A) (by C-G formula based on SCr of 1.52 mg/dL (H)).    No Known Allergies  Antimicrobials this admission: 12/3 Zosyn >> 12/3 Vancomycin  Dose adjustments this admission:   Microbiology results: 11/29 resp panel: neg covid, flu, rsv 11/29 Resp PCR: none detected 11/30 MRSA PCR: not detected 12/3 Tracheal aspirate:  12/3 MRSA PCR:   Thank you for allowing pharmacy to be a part of this patient's care.  Lynann Beaver PharmD, BCPS [facility] main pharmacy [phone] 07/28/2023 11:00 AM

## 2023-07-28 NOTE — Progress Notes (Signed)
Bilateral lower extremity venous duplex has been completed. Preliminary results can be found in CV Proc through chart review.   07/28/23 1:48 PM Olen Cordial RVT

## 2023-07-28 NOTE — Progress Notes (Addendum)
Per RN/Charge RN, pt is having possible intermittent seizures.  CPT held at this time.

## 2023-07-28 NOTE — Progress Notes (Signed)
PHARMACY - ANTICOAGULATION CONSULT NOTE  Pharmacy Consult for heparin Indication: atrial fibrillation  No Known Allergies  Patient Measurements: Height: 5' 7.01" (170.2 cm) Weight: (!) 197.4 kg (435 lb 3.2 oz) IBW/kg (Calculated) : 66.12 Heparin Dosing Weight: 117 kg  Vital Signs: Temp: 101.6 F (38.7 C) (12/03 1500) Temp Source: Axillary (12/03 1500) BP: 85/53 (12/03 1417) Pulse Rate: 75 (12/03 1412)  Labs: Recent Labs    07/26/23 0816 07/26/23 1044 07/27/23 0500 07/28/23 0400  HGB  --  16.9  --  15.8  HCT  --  54.3*  --  50.3  PLT  --  154  --  141*  CREATININE 1.83*  --  1.51* 1.52*    Estimated Creatinine Clearance: 78 mL/min (A) (by C-G formula based on SCr of 1.52 mg/dL (H)).   Medical History: History reviewed. No pertinent past medical history.   Assessment: 67 year old male presented with cough, SOB, lower extremity edema. He remains intubated. He was having runs of V.tach. Between NSR and a.fib with bigeminy and trigeminy. Patient started on amiodarone infusion and pharmacy consulted for heparin infusion.  Goal of Therapy:  Heparin level 0.3-0.7 units/ml Monitor platelets by anticoagulation protocol: Yes   Plan:  -Defer initial heparin bolus given recent subcu heparin administration -Start heparin infusion at 1650 units/hr -Check 6 hour heparin level -Daily CBC   Pricilla Riffle, PharmD, BCPS Clinical Pharmacist 07/28/2023 6:49 PM

## 2023-07-28 NOTE — Progress Notes (Signed)
[facility] is here to transport patient to [facility], report given to receiving nurse at 4N.   Ceribell removed from patient.

## 2023-07-28 NOTE — Progress Notes (Signed)
NAME:  Levi [name], MRN:  [id number], DOB:  [birth year], LOS: 4 ADMISSION DATE:  07/24/2023, CONSULTATION DATE: 07/24/2023  REFERRING MD: Marlene Bast, CHIEF COMPLAINT: Shortness of breath and hypoxia  History of Present Illness:  A 67 yr old male patient with morbid obesity, probably untreated OSA/OHS, and knee OA, who presented to ED with dry cough, DOE, SOB, LL edema, orthopnea for 1-2 weeks. Home health RN found his SpO2 64% on RA. Given a dose of Albuterol. Denied wheezing, CP, N/V/D, abd pain, f/c/r, and rash. Not on home O2 or NIV.  No smoking, illicit drug use, or alcoholism.  No hx of COPD, asthma, VTE, cardiac hx, liver disease, or CKD Developed runs of VT in ED. No EKG changes initially.   Pertinent  Medical History  History reviewed. No pertinent past medical history.  Significant Hospital Events: Including procedures, antibiotic start and stop dates in addition to other pertinent events   11/29 admitted 12/3 trach aspirate sent for thick secretions  Interim History / Subjective:  NAEON. -12.5L net since admit Got agitated this AM with bath, increase in Precedex Thick secretions from ETT today, sending trach aspirate.  Objective   Blood pressure 123/67, pulse 91, temperature 98.8 F (37.1 C), temperature source Axillary, resp. rate 19, height 5' 7.01" (1.702 m), weight (!) 197.4 kg, SpO2 91%. CVP:  [7 mmHg-8 mmHg] 7 mmHg  Vent Mode: PRVC FiO2 (%):  [30 %-40 %] 30 % Set Rate:  [20 bmp] 20 bmp Vt Set:  [500 mL] 500 mL PEEP:  [8 cmH20] 8 cmH20 Pressure Support:  [10 cmH20] 10 cmH20 Plateau Pressure:  [20 cmH20-26 cmH20] 20 cmH20   Intake/Output Summary (Last 24 hours) at 07/28/2023 0759 Last data filed at 07/28/2023 7829 Gross per 24 hour  Intake 2534.65 ml  Output 5100 ml  Net -2565.35 ml   Filed Weights   07/26/23 0500 07/27/23 0500 07/28/23 0218  Weight: (!) 201.5 kg (!) 201.4 kg (!) 197.4 kg    Examination: General: Adult male, morbidly obese HENT:  Endotracheal tube in place, [place]/AT, Lungs: Normal effort. Decreased air movement bilaterally, thick yellow secretions noted from ETT Cardiovascular: RRR, no M/R/G Abdomen: Obese, bowel sounds appreciated Extremities: 1+ edema bilaterally Neuro: Sedated, not following commands though was agitated overnight per notes  Assessment & Plan:   Decompensated congestive heart failure - echo from 11/30 with EF <20%, G1DD, severely reduced RVSF, severely enlarged RV - Continue Lasix this AM, might need to hold as he has had -12.5L net output and SCr now plateauing with bump in BUN along with contraction  - Diamox x 2 doses today - Cardiology following, appreciate the assistance - Considering R/LHC once extubated - GDMT once extubated - Would get him established with AHF clinic  Acute Hypoxic and Hypercapnic respiratory failure - increased thick secretions 12/2 Presumed OSA/OHS - Continue mechanical ventilation - Add trach aspirate and PCT - Add hypertonic nebs and chest PT - Holding abx for now as not febrile and no leukocytosis, follow clinically along with trach aspirate - Hold on extubation for now, too high of a secretion burden currently - Ventilator associated pneumonia prevention protocol - Would recommend sleep study as outpatient, likely needs nocturnal CPAP  Did have runs of V. tach when he came in - resolved - Did receive metoprolol, electrolytes corrected  Acute kidney injury Slight contraction alkalosis 2/2 aggressive diuresis Hypokalemia - Diuresis as above, decrease given slight contraction now - Diamox x 2 doses today - K -  Follow BMP  Congestive hepatopathy - Continue supportive care  Morbid Obesity - Weight loss needed  Best Practice (right click and "Reselect all SmartList Selections" daily)   Diet/type: tubefeeds DVT prophylaxis: heparin injection 5,000 Units Start: 07/25/23 1400 SCDs Start: 07/24/23 2257  Pressure ulcer(s): not present on admission  GI  prophylaxis: H2B Lines: Central line Foley:  Yes, and it is still needed Code Status:  full code Last date of multidisciplinary goals of care discussion [discussed with patient's spouse and brother at bedside 12/1]   CC time: 35 min.   Rutherford Guys, PA - [facility] For pager details, please see AMION or use Epic chat  After 1900, please call [facility] for cross coverage needs 07/28/2023, 7:59 AM

## 2023-07-28 NOTE — Progress Notes (Addendum)
[name] Date of Encounter: 07/28/2023 [facility] Cardiologist: None   Interval Summary  .    Still intubated, has having too many secretions had a concerning to extubate.  Continues to have cough and hypoxic episodes.  Vital Signs .    Vitals:   07/28/23 2051 07/28/23 2115 07/28/23 2120 07/28/23 2130  BP:  (!) 85/34 (!) 98/55 (!) 97/55  Pulse:  64 (!) 139 78  Resp:  (!) 23 (!) 21 20  Temp:      TempSrc:      SpO2: 94% 94% 93% 95%  Weight:      Height:        Intake/Output Summary (Last 24 hours) at 07/28/2023 2153 Last data filed at 07/28/2023 2130 Gross per 24 hour  Intake 2481.31 ml  Output 2625 ml  Net -143.69 ml      07/28/2023    2:18 AM 07/27/2023    5:00 AM 07/26/2023    5:00 AM  Last 3 Weights  Weight (lbs) 435 lb 3.2 oz 444 lb 444 lb 3.2 oz  Weight (kg) 197.405 kg 201.397 kg 201.488 kg      Telemetry/ECG    Sinus rhythm heart rates in the 70s, frequent ectopy- Personally Reviewed  CV Studies    Echocardiogram 07/25/2023 1. Left ventricular ejection fraction, by estimation, is <20%. The left  ventricle has severely decreased function. The left ventricle demonstrates  global hypokinesis. The left ventricular internal cavity size was mildly  dilated. There is mild concentric   left ventricular hypertrophy. Left ventricular diastolic parameters are  consistent with Grade I diastolic dysfunction (impaired relaxation).   2. Right ventricular systolic function is severely reduced. The right  ventricular size is severely enlarged. There is normal pulmonary artery  systolic pressure. The estimated right ventricular systolic pressure is  29.4 mmHg.   3. The mitral valve is grossly normal. No evidence of mitral valve  regurgitation. No evidence of mitral stenosis.   4. The aortic valve is tricuspid. Aortic valve regurgitation is trivial.  No aortic stenosis is present.   5. There is mild dilatation of the ascending aorta,  measuring 41 mm.   6. The inferior vena cava is dilated in size with <50% respiratory  variability, suggesting right atrial pressure of 15 mmHg.   Comparison(s): No prior Echocardiogram.   Physical Exam .   GEN: On mechanical ventilation, Precedex drip Neck: Difficult to assess JVD Cardiac: RRR, no murmurs, rubs, or gallops.  Respiratory: Clear to auscultation bilaterally anteriorly. GI: Soft, nontender, non-distended  MS: No edema  Patient Profile    Levi [name] is a 67 y.o. male has hx of no past medical history, morbid obesity.  Admitted on 07/25/2023 due to shortness of breath, lower extremity edema, orthopnea.  At home was hypoxic with oxygen saturations 64% on room air.  On arrival required BiPAP however was later intubated.  Cardiology asked to see due to new onset biventricular heart failure Assessment & Plan .     New acute HFrEF Unclear cardiomyopathy  LVEF less than 20% with global hypokinesis, mild concentric LVH.  Severely reduced RV function, severely enlarged.  Normal PASP.  Troponins [id number].  EKG without any acute ST-T wave changes.  12/01 COOX was 74%, not low output.  Diuresing well -5.1 L in the last 24 hours.  Volume status very difficult to assess to body habitus.  Plan for right and left heart catheterization once extubated and more stable. Volume status  difficult to assess.  Has CRS, improved renal function with diuresis. Continue to follow renal function  to help guide diuresis.  On IV Lasix 40 mg twice daily, acetazolamide 2050 mg twice daily Has had normal lactic acid levels, LFTs improving. Titrate GDMT once more stable Does not appear he has had a workup for PE.  Consider LE Dopplers.  Possible V/Q once more stable Will need outpatient sleep study  AKI Creatinine on admission 2.6 and has been downtrending with diuresis.  1.52 currently.  Unclear baseline renal function.  Acute respiratory failure requiring mechanical ventilation Having very thick  secretions preventing extubation.  Further management per primary team.  NSVT Resolved with electrolyte correction  Mild dilatation of ascending aorta (41 mm)  Unclear why he is on 324 mg of aspirin, changing to 81mg .  For questions or updates, please contact [facility] Please consult [email or web address] for contact info under      Signed, Abagail Kitchens, PA-C    ADDENDUM:   Patient seen and examined with Abagail Kitchens, PA-C .  I personally taken a history, examined the patient, reviewed relevant notes,  laboratory data / imaging studies.  I performed a substantive portion of this encounter and formulated the important aspects of the plan.  I agree with the APP's note, impression, and recommendations; however, I have edited the note to reflect changes or salient points (which are noted in italics).   Patient remains intubated due to increased secretions. No family present at bedside. Noted to have frequent ectopy on telemetry with episodes of A-fib Discussed case with attending physician, critical care APP, and nursing staff  PHYSICAL EXAM: Today's Vitals   07/28/23 2051 07/28/23 2115 07/28/23 2120 07/28/23 2130  BP:  (!) 85/34 (!) 98/55 (!) 97/55  Pulse:  64 (!) 139 78  Resp:  (!) 23 (!) 21 20  Temp:      TempSrc:      SpO2: 94% 94% 93% 95%  Weight:      Height:      PainSc:       Body mass index is 68.15 kg/m.   Net IO Since Admission: -12,890.64 mL [07/28/23 2153]  Filed Weights   07/26/23 0500 07/27/23 0500 07/28/23 0218  Weight: (!) 201.5 kg (!) 201.4 kg (!) 197.4 kg    General: Intubated and sedated, acutely ill, appears older than stated age. HEENT: OG tube in place, ET tube in place, left central line IJ in place Heart: Tachycardic, positive S1-S2, no murmurs rubs or gallops appreciated secondary to tachycardia Lungs: Mechanical breath sounds bilaterally Abdomen: Soft, nontender, nondistended, positive bowel sounds in all 4 quadrants Extremities: No  edema, upper extremities in restraints  EKG: (personally reviewed by me) 07/28/2023: Atrial fibrillation with rapid ventricular rate  Telemetry: (personally reviewed by me) Episodes of sinus rhythm with ventricular ectopy as well as episodes of atrial fibrillation and spontaneously converted to sinus rhythm   Impression:  Impression: Acute biventricular heart failure. Acute heart failure with reduced EF Cardiomyopathy, unspecified and newly discovered New onset of atrial fibrillation with rapid ventricular rate. Hypercarbic/hypoxic respiratory failure-vent support Acute kidney injury-improving Diabetes mellitus Aortic atherosclerosis Morbid obesity-BMI 68  Recommendations:  Acute biventricular heart failure. Acute heart failure with reduced EF Cardiomyopathy, unspecified and newly discovered Stage C, NYHA class III. Remains intubated due to increased secretions. Currently not on pressor inotropic support. Has diuresed well Net IO Since Admission: -12,890.64 mL [07/28/23 2153] High sensitive troponins peaked at 371. LDL calculated 87. Initial plan  was he would have been hopefully extubated today and if renal function and hemodynamics allow would proceed with left and right heart catheterization.  However he remains intubated due to increased secretions, he has developed new onset of atrial fibrillation over the last 24 hours, and now developing fever and remaining hypotensive. Currently on acetazolamide and Lasix push. Focus on diuresis for now and will uptitrate GDMT based on his clinical trajectory  New onset of atrial fibrillation Likely secondary to hypercapnic/hypoxic respiratory failure, cardiomyopathy, and biventricular heart failure. Will start IV amiodarone drip for rate and rhythm control strategy and IV heparin for thromboembolic prophylaxis as long as no contraindications for bleeding. Click Here to Calculate/Change CHADS2VASc Score The patient's CHADS2-VASc score is  4, indicating a 4.8% annual risk of stroke.   CHF History: Yes HTN History: No Diabetes History: Yes Stroke History: No Vascular Disease History: Yes  AKI: Serum creatinine improving. Monitor BUN and creatinine. Continue diuresis   Further recommendations to follow as the case evolves.   This note was created using a voice recognition software as a result there may be grammatical errors inadvertently enclosed that do not reflect the nature of this encounter. Every attempt is made to correct such errors.   Tessa Lerner, DO, [facility]  [address] [place], [place] [address] Pager: [phone] Office: [phone] 07/28/2023

## 2023-07-28 NOTE — Progress Notes (Signed)
PCCM Brief Note  Some SVT and desaturations. Vent FiO2 increased from 30 to 40.  Lytes normal from this AM though Mg 1.9 yesterday. PCT up at 2.96. BNP mildly up at 380. Thick secretions.  Received 40mg  Lasix this AM as well as K.   Will give 4g Mag. Get EKG. Start empiric Vanc/Zosyn for HCAP/possible aspiration.   Rutherford Guys, PA - C [facility] For pager details, please see AMION or use Epic chat  After 1900, please call [facility] for cross coverage needs 07/28/2023, 10:49 AM

## 2023-07-28 NOTE — Progress Notes (Signed)
eLink Physician-Brief Progress Note [name] DOB: 05-04-1956 MRN: [id number]   Date of Service  07/28/2023  HPI/Events of Note  67 year old male that is mechanically ventilated in the setting of decompensated heart failure undergoing ongoing diuresis.  This morning, the patient became severely agitated during his bath.  Escalating sedatives and analgesics.  eICU Interventions  Will continue to escalate Precedex as needed.  Patient appears much more calm.  If he becomes agitated again, one-time push of Versed made available.     Intervention Category Minor Interventions: Agitation / anxiety - evaluation and management  Larue Drawdy 07/28/2023, 6:39 AM

## 2023-07-28 NOTE — Progress Notes (Signed)
eLink Physician-Brief Progress Note Patient Name: DUY STRAWTHER DOB: April 07, 1956 MRN: [id number]   Date of Service  07/28/2023  HPI/Events of Note  67 year old male that is mechanically ventilated in the setting of decompensated heart failure undergoing ongoing diuresis.  Transferred to [facility] from [facility] for concern of seizure activity and continuous EEG monitoring.  Patient was recently febrile, otherwise normal vitals.  Saturating 88% on the ventilator.  RASS of -3.  Recent laboratory studies reviewed with evidence of elevated creatinine and hyperglycemia.  Lower extremity DVT ultrasounds performed without evidence of DVT.  eICU Interventions  Ongoing broad-spectrum antibiotics with vancomycin and Zosyn  Maintain diuresis with net negative goal daily  Keppra, neurology consultation, and EEG monitoring.  Depending on EEG findings, may need to change analgosedatives.  DVT prophylaxis with heparin infusion GI prophylaxis with famotidine     Intervention Category Evaluation Type: New Patient Evaluation  Berda Shelvin 07/28/2023, 11:13 PM

## 2023-07-28 NOTE — Progress Notes (Signed)
Gave ordered sputum sample to RN for Lab delivery at 1110. ICU RT aware.

## 2023-07-28 NOTE — Progress Notes (Signed)
PCCM Brief Note  In and between NSR then A.fib with Bigeminy and Trigeminy most of the afternoon.  Discussed with cardiology this evening. Will start Amiodarone and Heparin infusion.    Rutherford Guys, PA - C [place] [facility] For pager details, please see AMION or use Epic chat  After 1900, please call [facility] for cross coverage needs 07/28/2023, 5:20 PM

## 2023-07-28 NOTE — Progress Notes (Signed)
PCCM Brief Note  Rhythmic twitching left side face/eyelids with corresponding LLE twitches. Lasted a few minutes before resolving spontaneously.  Possible seizure activity? Will order Ceribell. Have notified Dr. Melynda Ripple of this who will kindly review once hooked up. Has PRN Midazolam already ordered. Hold off for now unless has recurrence or Ceribell shows some activity.   Rutherford Guys, PA - C [place] [facility] For pager details, please see AMION or use Epic chat  After 1900, please call [facility] for cross coverage needs 07/28/2023, 6:30 PM

## 2023-07-29 ENCOUNTER — Inpatient Hospital Stay (HOSPITAL_COMMUNITY): Payer: Medicare HMO

## 2023-07-29 DIAGNOSIS — R569 Unspecified convulsions: Secondary | ICD-10-CM | POA: Diagnosis not present

## 2023-07-29 DIAGNOSIS — I480 Paroxysmal atrial fibrillation: Secondary | ICD-10-CM

## 2023-07-29 DIAGNOSIS — I5040 Unspecified combined systolic (congestive) and diastolic (congestive) heart failure: Secondary | ICD-10-CM

## 2023-07-29 DIAGNOSIS — I429 Cardiomyopathy, unspecified: Secondary | ICD-10-CM | POA: Diagnosis not present

## 2023-07-29 DIAGNOSIS — I5021 Acute systolic (congestive) heart failure: Secondary | ICD-10-CM | POA: Diagnosis not present

## 2023-07-29 DIAGNOSIS — G253 Myoclonus: Secondary | ICD-10-CM

## 2023-07-29 DIAGNOSIS — J449 Chronic obstructive pulmonary disease, unspecified: Secondary | ICD-10-CM | POA: Diagnosis not present

## 2023-07-29 DIAGNOSIS — J189 Pneumonia, unspecified organism: Secondary | ICD-10-CM

## 2023-07-29 DIAGNOSIS — I700 Atherosclerosis of aorta: Secondary | ICD-10-CM

## 2023-07-29 DIAGNOSIS — J9601 Acute respiratory failure with hypoxia: Secondary | ICD-10-CM | POA: Diagnosis not present

## 2023-07-29 DIAGNOSIS — I509 Heart failure, unspecified: Secondary | ICD-10-CM | POA: Diagnosis not present

## 2023-07-29 DIAGNOSIS — I5082 Biventricular heart failure: Secondary | ICD-10-CM | POA: Diagnosis not present

## 2023-07-29 DIAGNOSIS — J9602 Acute respiratory failure with hypercapnia: Secondary | ICD-10-CM | POA: Diagnosis not present

## 2023-07-29 DIAGNOSIS — N179 Acute kidney failure, unspecified: Secondary | ICD-10-CM | POA: Diagnosis not present

## 2023-07-29 DIAGNOSIS — G9341 Metabolic encephalopathy: Secondary | ICD-10-CM

## 2023-07-29 LAB — COMPREHENSIVE METABOLIC PANEL
ALT: 100 U/L — ABNORMAL HIGH (ref 0–44)
AST: 34 U/L (ref 15–41)
Albumin: 2.4 g/dL — ABNORMAL LOW (ref 3.5–5.0)
Alkaline Phosphatase: 69 U/L (ref 38–126)
Anion gap: 12 (ref 5–15)
BUN: 82 mg/dL — ABNORMAL HIGH (ref 8–23)
CO2: 32 mmol/L (ref 22–32)
Calcium: 8.1 mg/dL — ABNORMAL LOW (ref 8.9–10.3)
Chloride: 99 mmol/L (ref 98–111)
Creatinine, Ser: 2.33 mg/dL — ABNORMAL HIGH (ref 0.61–1.24)
GFR, Estimated: 30 mL/min — ABNORMAL LOW (ref >60)
Glucose, Bld: 169 mg/dL — ABNORMAL HIGH (ref 70–99)
Potassium: 4.1 mmol/L (ref 3.5–5.1)
Sodium: 143 mmol/L (ref 135–145)
Total Bilirubin: 2.3 mg/dL — ABNORMAL HIGH (ref <1.2)
Total Protein: 5.4 g/dL — ABNORMAL LOW (ref 6.5–8.1)

## 2023-07-29 LAB — GLUCOSE, CAPILLARY
Glucose-Capillary: 171 mg/dL — ABNORMAL HIGH (ref 70–99)
Glucose-Capillary: 167 mg/dL — ABNORMAL HIGH (ref 70–99)
Glucose-Capillary: 181 mg/dL — ABNORMAL HIGH (ref 70–99)
Glucose-Capillary: 191 mg/dL — ABNORMAL HIGH (ref 70–99)
Glucose-Capillary: 200 mg/dL — ABNORMAL HIGH (ref 70–99)
Glucose-Capillary: 240 mg/dL — ABNORMAL HIGH (ref 70–99)

## 2023-07-29 LAB — PROCALCITONIN: Procalcitonin: 5.45 ng/mL

## 2023-07-29 LAB — CBC
HCT: 50.4 % (ref 39.0–52.0)
Hemoglobin: 15.3 g/dL (ref 13.0–17.0)
MCH: 25.2 pg — ABNORMAL LOW (ref 26.0–34.0)
MCHC: 30.4 g/dL (ref 30.0–36.0)
MCV: 83 fL (ref 80.0–100.0)
Platelets: 128 10*3/uL — ABNORMAL LOW (ref 150–400)
RBC: 6.07 MIL/uL — ABNORMAL HIGH (ref 4.22–5.81)
RDW: 18.5 % — ABNORMAL HIGH (ref 11.5–15.5)
WBC: 9.2 10*3/uL (ref 4.0–10.5)
nRBC: 0 % (ref 0.0–0.2)

## 2023-07-29 LAB — COOXEMETRY PANEL
Carboxyhemoglobin: 2.3 % — ABNORMAL HIGH (ref 0.5–1.5)
Methemoglobin: <0.7 % (ref 0.0–1.5)
O2 Saturation: 64.3 %
Total hemoglobin: 15.9 g/dL (ref 12.0–16.0)

## 2023-07-29 LAB — CULTURE, RESPIRATORY W GRAM STAIN

## 2023-07-29 LAB — BRAIN NATRIURETIC PEPTIDE: B Natriuretic Peptide: 697.5 pg/mL — ABNORMAL HIGH (ref 0.0–100.0)

## 2023-07-29 LAB — HEPARIN LEVEL (UNFRACTIONATED)
Heparin Unfractionated: 0.35 [IU]/mL (ref 0.30–0.70)
Heparin Unfractionated: 0.42 [IU]/mL (ref 0.30–0.70)

## 2023-07-29 MED ORDER — POLYETHYLENE GLYCOL 3350 17 G PO PACK
17.0000 g | PACK | Freq: Every day | ORAL | Status: DC
  Administered 2023-07-29: 17 g
  Filled 2023-07-29: qty 1

## 2023-07-29 MED ORDER — FAMOTIDINE 20 MG PO TABS
20.0000 mg | ORAL_TABLET | Freq: Every day | ORAL | Status: DC
  Administered 2023-07-29 – 2023-08-08 (×10): 20 mg
  Filled 2023-07-29 (×10): qty 1

## 2023-07-29 MED ORDER — VANCOMYCIN HCL 1750 MG/350ML IV SOLN
1750.0000 mg | INTRAVENOUS | Status: DC
  Filled 2023-07-29: qty 350

## 2023-07-29 MED ORDER — SENNA 8.6 MG PO TABS
1.0000 | ORAL_TABLET | Freq: Every day | ORAL | Status: DC
  Administered 2023-07-29: 8.6 mg
  Filled 2023-07-29: qty 1

## 2023-07-29 MED ORDER — POLYETHYLENE GLYCOL 3350 17 G PO PACK
17.0000 g | PACK | Freq: Every day | ORAL | Status: DC | PRN
Start: 2023-07-29 — End: 2023-08-08
  Administered 2023-08-05: 17 g
  Filled 2023-07-29: qty 1

## 2023-07-29 MED ORDER — ACETAMINOPHEN 325 MG PO TABS
650.0000 mg | ORAL_TABLET | Freq: Four times a day (QID) | ORAL | Status: DC | PRN
  Filled 2023-07-29: qty 2

## 2023-07-29 MED ORDER — SODIUM CHLORIDE 3 % IN NEBU
4.0000 mL | INHALATION_SOLUTION | Freq: Two times a day (BID) | RESPIRATORY_TRACT | Status: AC
  Administered 2023-07-29 – 2023-07-30 (×3): 4 mL via RESPIRATORY_TRACT
  Filled 2023-07-29 (×4): qty 4

## 2023-07-29 MED ORDER — CHLORHEXIDINE GLUCONATE CLOTH 2 % EX PADS
6.0000 | MEDICATED_PAD | Freq: Every day | CUTANEOUS | Status: DC
  Administered 2023-07-30 – 2023-08-08 (×11): 6 via TOPICAL

## 2023-07-29 NOTE — Consult Note (Addendum)
NEUROLOGY CONSULT NOTE   Date of service: July 29, 2023 [name] MRN:  [id number] DOB:  Jul 07, 1956 Chief Complaint: "myoclonic jerks noted on ceribell, no obvious seizures but concern about possible seizures" Requesting Provider: Lorin Glass, MD  History of Present Illness  Levi [name] is a 67 y.o. male morbidly obese, osteoarthritis, admitted to [facility] with decompensated CHF, and initially on Bipap for hypercapnea and then intubated. While at [facility], he was noted to have intermittent facial twitching concerning for myoclonic jerks vs seizure. He was put on ceribell limited EEG which was unable to clarify but notable for potential rhythmicity, concerning for a focal seizure vs myoclonus. He was loaded with Keppra and sent to [facility] ICU for a continuous video EEG.  Patient is currently intubated and unable to provide any history. I am unable to get in touch with his family at this time.  Hx obtained from chart review.   ROS  Unable to ascertain due to obtunded mentation.  Past History  History reviewed. No pertinent past medical history.  History reviewed. No pertinent surgical history.  Family History: History reviewed. No pertinent family history.  Social History  reports that he has never smoked. He has never used smokeless tobacco. No history on file for alcohol use and drug use.  No Known Allergies  Medications   Current Facility-Administered Medications:    acetaminophen (TYLENOL) tablet 650 mg, 650 mg, Oral, Q6H PRN, Patrici Ranks, MD, 650 mg at 07/28/23 1423   acetaZOLAMIDE (DIAMOX) tablet 250 mg, 250 mg, Oral, BID, Desai, Rahul P, PA-C, 250 mg at 07/28/23 0936   albuterol (PROVENTIL) (2.5 MG/3ML) 0.083% nebulizer solution 2.5 mg, 2.5 mg, Nebulization, Q6H PRN, Lorin Glass, MD, 2.5 mg at 07/28/23 2051   [COMPLETED] amiodarone (NEXTERONE) 1.8 mg/mL load via infusion 150 mg, 150 mg, Intravenous, Once, 150 mg at 07/28/23 2020 **FOLLOWED BY**  [EXPIRED] amiodarone (NEXTERONE PREMIX) 360-4.14 MG/200ML-% (1.8 mg/mL) IV infusion, 60 mg/hr, Intravenous, Continuous, Last Rate: 33.3 mL/hr at 07/29/23 0100, 60 mg/hr at 07/29/23 0100 **FOLLOWED BY** amiodarone (NEXTERONE PREMIX) 360-4.14 MG/200ML-% (1.8 mg/mL) IV infusion, 30 mg/hr, Intravenous, Continuous, Ellington, Abby K, RPH   aspirin chewable tablet 81 mg, 81 mg, Per Tube, Daily, Abagail Kitchens, PA-C, 81 mg at 07/28/23 4098   Chlorhexidine Gluconate Cloth 2 % PADS 6 each, 6 each, Topical, Daily, Albustami, Flonnie Hailstone, MD, 6 each at 07/28/23 0600   dexmedetomidine (PRECEDEX) 400 MCG/100ML (4 mcg/mL) infusion, 0-1.2 mcg/kg/hr, Intravenous, Titrated, Ellington, Abby K, RPH, Last Rate: 54.3 mL/hr at 07/29/23 0100, 1.1 mcg/kg/hr at 07/29/23 0100   docusate sodium (COLACE) capsule 100 mg, 100 mg, Oral, BID PRN, Albustami, Flonnie Hailstone, MD   famotidine (PEPCID) IVPB 20 mg premix, 20 mg, Intravenous, Q24H, Albustami, Flonnie Hailstone, MD, Last Rate: 100 mL/hr at 07/29/23 0114, 20 mg at 07/29/23 0114   feeding supplement (PIVOT 1.5 CAL) liquid 1,000 mL, 1,000 mL, Per Tube, Q24H, Lorin Glass, MD, Infusion Verify at 07/29/23 0100   feeding supplement (PROSource TF20) liquid 60 mL, 60 mL, Per Tube, TID, Lorin Glass, MD, 60 mL at 07/28/23 1554   fentaNYL (SUBLIMAZE) injection 50 mcg, 50 mcg, Intravenous, Q2H PRN, Olalere, Adewale A, MD, 50 mcg at 07/28/23 1000   fentaNYL in NS (37mcg/ml) infusion-PREMIX, 0-400 mcg/hr, Intravenous, Continuous, Albustami, Flonnie Hailstone, MD, Last Rate: 12.5 mL/hr at 07/29/23 0100, 125 mcg/hr at 07/29/23 0100   furosemide (LASIX) injection 40 mg, 40 mg, Intravenous, Q12H, Celine Mans,  Rahul P, PA-C, 40 mg at 07/28/23 0635   heparin ADULT infusion 100 units/mL (25000 units/218mL), 1,650 Units/hr, Intravenous, Continuous, Ellington, Abby K, RPH, Last Rate: 16.5 mL/hr at 07/29/23 0100, 1,650 Units/hr at 07/29/23 0100   insulin aspart (novoLOG) injection 0-9 Units, 0-9 Units, Subcutaneous,  Q4H, Albustami, Flonnie Hailstone, MD, 1 Units at 07/29/23 0114   levETIRAcetam (KEPPRA) IVPB 1000 mg/100 mL premix, 1,000 mg, Intravenous, Q12H, Lorin Glass, MD   midazolam (VERSED) injection 1-2 mg, 1-2 mg, Intravenous, Q4H PRN, Desai, Rahul P, PA-C   nystatin cream (MYCOSTATIN), , Topical, TID, Albustami, Flonnie Hailstone, MD, Given at 07/28/23 1554   ondansetron (ZOFRAN) injection 4 mg, 4 mg, Intravenous, Q6H PRN, Albustami, Flonnie Hailstone, MD   Oral care mouth rinse, 15 mL, Mouth Rinse, Q2H, Lorin Glass, MD, 15 mL at 07/28/23 2047   Oral care mouth rinse, 15 mL, Mouth Rinse, PRN, Lorin Glass, MD   piperacillin-tazobactam (ZOSYN) IVPB 3.375 g, 3.375 g, Intravenous, Q8H, Shade, Christine E, RPH, Paused at 07/29/23 0040   polyethylene glycol (MIRALAX / GLYCOLAX) packet 17 g, 17 g, Oral, Daily PRN, Albustami, Flonnie Hailstone, MD   vancomycin (VANCOREADY) IVPB 2000 mg/400 mL, 2,000 mg, Intravenous, Q24H, Shade, Christine E, RPH  Vitals   Vitals:   07/29/23 0045 07/29/23 0100 07/29/23 0115 07/29/23 0130  BP: 116/62 (!) 114/59 (!) 114/59 103/62  Pulse: (!) 54 (!) 55 62 (!) 57  Resp: 18 (!) 21 20 20   Temp:      TempSrc:      SpO2: 95% 96% 96% 96%  Weight:      Height:        Body mass index is 68.15 kg/m.  Physical Exam   General: obtunded, intubated; in no acute distress.  HENT: Normal oropharynx and mucosa. Normal external appearance of ears and nose.  Neck: Supple, no pain or tenderness  CV: No JVD. No peripheral edema.  Pulmonary: Symmetric Chest rise. Breathing over vent. Abdomen: Soft to touch, non-tender.  Ext: No cyanosis, edema, or deformity  Skin: No rash. Normal palpation of skin.   Musculoskeletal: Normal digits and nails by inspection. No clubbing.   Neurologic Examination  Mental status/Cognition: no response to voice or loud clap, grimaces to noxious stimul with some myoclonus noted in forehead. Speech/language: mute, no speech, does not follow commands or  Cranial nerves:   CN II Pupils  equal and reactive to light, unable to assess for VF deficits.   CN III,IV,VI EOMI intact to dolls eyes.   CN V Corneals intact BL   CN VII Symmetric facial grimace.   CN VIII Does not turn head towards speech   CN IX & X Cough intact, gag intact   CN XI Head midline.   CN XII Tongue to the bottom by ETT   Sensory/Motor:  Muscle bulk: normal, tone flaccid in all extremities. Mild withdrawal noted in BL upper to proximal pinch. No response to proximal pinch in BL lower extremities.  Coordination/Complex Motor:  Unable to assess.  Labs/Imaging/Neurodiagnostic studies   CBC:  Recent Labs  Lab 07/27/23 1815 2023/07/27 2248 07/26/23 1044 07/28/23 0400  WBC 11.5*   < > 10.8* 9.8  NEUTROABS 8.8*  --  9.7*  --   HGB 15.6   < > 16.9 15.8  HCT 52.0   < > 54.3* 50.3  MCV 87.4   < > 82.4 82.9  PLT 184   < > 154 141*   < > = values in this  interval not displayed.   Basic Metabolic Panel:  Lab Results  Component Value Date   NA 139 07/28/2023   K 3.7 07/28/2023   CO2 34 (H) 07/28/2023   GLUCOSE 143 (H) 07/28/2023   BUN 79 (H) 07/28/2023   CREATININE 1.95 (H) 07/28/2023   CALCIUM 7.7 (L) 07/28/2023   GFRNONAA 37 (L) 07/28/2023   Lipid Panel:  Lab Results  Component Value Date   LDLCALC 87 07/25/2023   HgbA1c:  Lab Results  Component Value Date   HGBA1C 7.5 (H) 07/25/2023   Urine Drug Screen:     Component Value Date/Time   LABOPIA NONE DETECTED 07/24/2023 2035   COCAINSCRNUR NONE DETECTED 07/24/2023 2035   LABBENZ NONE DETECTED 07/24/2023 2035   AMPHETMU NONE DETECTED 07/24/2023 2035   THCU NONE DETECTED 07/24/2023 2035   LABBARB NONE DETECTED 07/24/2023 2035    Alcohol Level No results found for: "ETH" INR  Lab Results  Component Value Date   INR 1.5 (H) 07/24/2023   APTT  Lab Results  Component Value Date   APTT 31 07/24/2023   AED levels: No results found for: "PHENYTOIN", "ZONISAMIDE", "LAMOTRIGINE", "LEVETIRACETA"  CT Head without  contrast: pending  Neurodiagnostics cEEG:  pending  ASSESSMENT   KYZER KROTZ is a 67 y.o. male morbidly obese, osteoarthritis, admitted to [facility] with decompensated CHF, and initially on Bipap for hypercapnea and then intubated. While at [facility], he was noted to have intermittent facial twitching concerning for myoclonic jerks vs seizure. He was put on ceribell limited EEG which was unable to clarify but notable for potential rhythmicity, concerning for a focal seizure vs myoclonus. He was loaded with Keppra and sent to [facility] Neuro ICU for a continuous video EEG. His serum ammonia was recently checked and normal.  Neuro exam notable fo intact brainstem reflexes, sedated on vent. With tactile stimulation, noted to have stimulus induced facial myoclonus. Thou this could be epileptic, he is on amiodarone which is a new medication and can cause myoclonus. Potential AKI and uremia also probably contributing. Will need to rule out structural brain lesion and seizure.   RECOMMENDATIONS  - loaded with Keppra. Continue Keppra 1000mg  BID for now. Should help with myoclonus and focal seizures. - LTM EEG overnight - Will get CT head w/o contrast. - we will continue to follow along. ______________________________________________________________________  This patient is critically ill and at significant risk of neurological worsening, death and care requires constant monitoring of vital signs, hemodynamics,respiratory and cardiac monitoring, neurological assessment, discussion with family, other specialists and medical decision making of high complexity. I spent 50 minutes of neurocritical care time  in the care of  this patient. This was time spent independent of any time provided by nurse practitioner or PA.  Erick Blinks [facility] 07/29/2023  6:56 AM   Signed, Erick Blinks, MD [facility]

## 2023-07-29 NOTE — Progress Notes (Signed)
[name] Date of Encounter: 07/29/2023 [facility] Cardiologist: None   Interval Summary  .    Remains intubated. Transferred from [place] to [facility] concerns for seizures Currently undergoing EEG. No family at bedside.  Vital Signs .    Vitals:   07/29/23 0725 07/29/23 0800 07/29/23 1124 07/29/23 1134  BP:      Pulse:      Resp:      Temp:  (!) 101.8 F (38.8 C)    TempSrc:  Oral    SpO2: 97%  94% 96%  Weight:      Height:        Intake/Output Summary (Last 24 hours) at 07/29/2023 1206 Last data filed at 07/29/2023 0900 Gross per 24 hour  Intake 3391.2 ml  Output 1775 ml  Net 1616.2 ml      07/29/2023    5:00 AM 07/28/2023    2:18 AM 07/27/2023    5:00 AM  Last 3 Weights  Weight (lbs) 433 lb 13.8 oz 435 lb 3.2 oz 444 lb  Weight (kg) 196.8 kg 197.405 kg 201.397 kg     CVP  Telemetry/ECG    Predominantly sinus rhythm with short episodes of PAF- Personally Reviewed  CV Studies    Echocardiogram 07/25/2023 1. Left ventricular ejection fraction, by estimation, is <20%. The left  ventricle has severely decreased function. The left ventricle demonstrates  global hypokinesis. The left ventricular internal cavity size was mildly  dilated. There is mild concentric   left ventricular hypertrophy. Left ventricular diastolic parameters are  consistent with Grade I diastolic dysfunction (impaired relaxation).   2. Right ventricular systolic function is severely reduced. The right  ventricular size is severely enlarged. There is normal pulmonary artery  systolic pressure. The estimated right ventricular systolic pressure is  29.4 mmHg.   3. The mitral valve is grossly normal. No evidence of mitral valve  regurgitation. No evidence of mitral stenosis.   4. The aortic valve is tricuspid. Aortic valve regurgitation is trivial.  No aortic stenosis is present.   5. There is mild dilatation of the ascending aorta, measuring 41 mm.    6. The inferior vena cava is dilated in size with <50% respiratory  variability, suggesting right atrial pressure of 15 mmHg.   Comparison(s): No prior Echocardiogram.   Physical Exam .   General: Intubated and sedated, acutely ill, appears older than stated age. HEENT: OG tube in place, ET tube in place, left central line IJ in place Heart: Regular, positive S1-S2, no murmurs rubs or gallops appreciated Lungs: Mechanical breath sounds bilaterally Abdomen: Soft, nontender, nondistended, positive bowel sounds in all 4 quadrants Extremities: No edema, upper extremities in restraints   Patient Profile    JAIDYNN ESPER is a 67 y.o. male has hx of no past medical history, morbid obesity.  Admitted on 07/25/2023 due to shortness of breath, lower extremity edema, orthopnea.  At home was hypoxic with oxygen saturations 64% on room air.  On arrival required BiPAP however was later intubated.  Cardiology asked to see due to new onset biventricular heart failure Assessment & Plan .   Impression:  Acute biventricular heart failure. Acute heart failure with reduced EF Cardiomyopathy, unspecified and newly discovered New onset of atrial fibrillation with rapid ventricular rate -now paroxysmal Hypercarbic/hypoxic respiratory failure-vent support Acute kidney injury Bilateral multifocal pneumonia with gram-negative rods Acute metabolic encephalopathy. Diabetes mellitus Aortic atherosclerosis Morbid obesity-BMI 68  Recommendations:  Acute biventricular heart  failure. Acute heart failure with reduced EF Cardiomyopathy, unspecified and newly discovered Remains intubated due to increased secretions, now having fevers, and gram negative rods.  Currently not on pressor or inotropic support. Has diuresed Net IO Since Admission: -11,158.67 mL [07/29/23 1206] but CVP remains elevated.  High sensitive troponins peaked at 371. LDL calculated 87. Lasix has been held due to AKI - for now.  No  cardiovascular procedures planned at this time.  Patient will be reevaluated based on his clinical trajectory.  New onset of atrial fibrillation Likely secondary to hypercapnic/hypoxic respiratory failure, cardiomyopathy, and biventricular heart failure. Converted to normal sinus rhythm. Telemetry notes paroxysmal A-fib episodes. Currently on IV heparin and amiodarone drip. Can be discontinued off of amiodarone if it is felt that the encephalopathy could be drug-induced. Click Here to Calculate/Change CHADS2VASc Score The patient's CHADS2-VASc score is 4, indicating a 4.8% annual risk of stroke.   CHF History: Yes HTN History: No Diabetes History: Yes Stroke History: No Vascular Disease History: Yes  AKI: Serum creatinine improving. Monitor BUN and creatinine. Continue diuresis  Bilateral multifocal pneumonia with gram-negative rods: Currently on Zosyn. Remains febrile.  ICU following.  Acute metabolic encephalopathy:  Multifactorial. Currently on EEG.   Further recommendations to follow as the case evolves.   This note was created using a voice recognition software as a result there may be grammatical errors inadvertently enclosed that do not reflect the nature of this encounter. Every attempt is made to correct such errors.   Tessa Lerner, DO, [facility]  [address] [place], [place] [address] Pager: [phone] Office: [phone] 07/29/2023

## 2023-07-29 NOTE — Progress Notes (Signed)
A nurse called from [facility] stating that some of the patient's belongings were left in his room at [facility] when he was transferred to [facility] last night.  I called the pt's wife, Ferlin Petrenko, to let her know she would need to pick them up. She said that there was nothing much of his there - "just a pair of sweatpants" and that they can just throw them away. I will call [facility] ICU to let them know. Shaden Higley C 3:31 PM

## 2023-07-29 NOTE — Progress Notes (Signed)
More awake and interactive.  No focal def Still has intermittent myoclonus  Formal read on CT brain still pending Eeg off Plan Cont full vent support give another 24hrs treating PNA  Body habitus challenging. Requiring baseline peep 8 Will assess for SBT in am May need to consider trach   Simonne Martinet ACNP-BC [facility] Pulmonary/Critical Care Pager # [phone] OR # [phone] if no answer

## 2023-07-29 NOTE — Progress Notes (Addendum)
NAME:  Levi [name], MRN:  [id number], DOB:  04-Feb-1956, LOS: 5 ADMISSION DATE:  07/24/2023, CONSULTATION DATE: 07/24/2023  REFERRING MD: Marlene Bast, CHIEF COMPLAINT: Shortness of breath and hypoxia  History of Present Illness:  A 67 yr old male patient with morbid obesity, probably untreated OSA/OHS, and knee OA, who presented to ED with dry cough, DOE, SOB, LL edema, orthopnea for 1-2 weeks. Home health RN found his SpO2 64% on RA. Given a dose of Albuterol. Denied wheezing, CP, N/V/D, abd pain, f/c/r, and rash. Not on home O2 or NIV.  No smoking, illicit drug use, or alcoholism.  No hx of COPD, asthma, VTE, cardiac hx, liver disease, or CKD Developed runs of VT in ED. No EKG changes initially.   Pertinent  Medical History  History reviewed. No pertinent past medical history.  Significant Hospital Events: Including procedures, antibiotic start and stop dates in addition to other pertinent events   11/29 admitted 12/3 trach aspirate sent for thick secretion. Amiodarone started for AF w/ bigeminy and trigeminy. New onset rhythmic twitching left face, LLE. Resolved spont.  12/4 Seen by neuro: myoclonic jerks noted on Ceribell monitor but not able to distinguish if seizure activity. Moved to [facility] for LTM. Started Keppra CT head pending. Febrile. Sats 88%. LEUS negative for DVT. Sputum pending. Had been started on vanc and zosyn 12/3 for fever    Interim History / Subjective:  Now at [facility]. LTM monitoring on.   Objective   Blood pressure (!) 154/120, pulse 79, temperature (!) 102 F (38.9 C), temperature source Axillary, resp. rate 20, height 5' 7.01" (1.702 m), weight (!) 196.8 kg, SpO2 97%. CVP:  [10 mmHg-15 mmHg] 15 mmHg  Vent Mode: PRVC FiO2 (%):  [40 %-80 %] 50 % Set Rate:  [20 bmp] 20 bmp Vt Set:  [500 mL] 500 mL PEEP:  [8 cmH20] 8 cmH20 Plateau Pressure:  [20 cmH20-23 cmH20] 23 cmH20   Intake/Output Summary (Last 24 hours) at 07/29/2023 4098 Last data filed at 07/29/2023  0615 Gross per 24 hour  Intake 3511.2 ml  Output 1865 ml  Net 1646.2 ml   Filed Weights   07/27/23 0500 07/28/23 0218 07/29/23 0500  Weight: (!) 201.4 kg (!) 197.4 kg (!) 196.8 kg    Examination: General obese sedated 67 year old male still on full vent support HENT NCAT MMM unable to assess JVD. Pupils pinpoint Pulm breath sounds course Pcxr ETT and CVL in satisfactory placement. Rotated film. Bibasilar vol loss. Bilateral airspace disease w/ prob element of edema. Aeration prob about the same adjusting for rotation  Card RRR Abd soft obese Ext warm chronic LE edema pulses strong GU cl yellow Neuro + cough, + wd to pain. Witnessed bilateral myoclonic twitching face and eyes after stimulation   Assessment & Plan:   Acute on chronic systolic heart failure - echo from 11/30 with EF <20%, G1DD, severely reduced RVSF, severely enlarged RV Plan Cont tele  Trend CVP (currently 15-17 but not sure exactly what his vol status is) Ck co-ox W/ rising scr will hold diuretics for today (he is >10 liters positive) Cardiology following, appreciate the assistance; Considering R/LHC  GDMT once extubated Will need get him established with AHF clinic  Acute Hypoxic and Hypercapnic respiratory failure -secondary to pulmonary edema and complicated further by PNA (12/3) Presumed OSA/OHS Pcxr about the same, wbc w/out change. Still febrile at times tmax 102.5 Sputum pending (prelim abd GNR) MRSA PCR neg Plan Day 2 abx, cont zosyn  stop vanc as MRSA PCR neg  Cont full vent support w/ daily assessment for readiness to wean  Ck abg Cont HT saline nebs and chest PT VAP bundle  PAD protocol RASS goal -1 to -2  New onset AF w/ intermittent ventricular ectopy: CHA2DS2/VAS score 2.  bigeminy/trigeminy VT -started on amio and heparin 12/3 Plan Cont tele  Cont amio  IV heparin  K > 4, Mg > 2 goal   Possible seizure. ? Embolic stroke in setting of new onset af. Also has several potential metabolic  insults that could contribute Plan LTM per neuro Cont keppra  Acute metabolic encephalopathy  Plan Wean fent off Cont precedex Change to PRN fent after CT Supportive care   Acute kidney injury w/ rising scr in context of on-going diuresis  Plan Hold diuretics today (I think airspace disease on cxr more PNA than edema) Keep euvolemic  Renal dose meds Am chem  Strict I&O  Congestive hepatopathy Improving Plan Continue supportive care  Hyperglycemia Plan SSI goal 140-180   Morbid Obesity Plan Weight loss needed  Best Practice (right click and "Reselect all SmartList Selections" daily)   Diet/type: tubefeeds DVT prophylaxis: IV heparin started 12/3   Pressure ulcer(s): not present on admission  GI prophylaxis: H2B Lines: Central line Foley:  Yes, and it is still needed Code Status:  full code Last date of multidisciplinary goals of care discussion [discussed with patient's spouse and brother at bedside 12/1]   Cct 45 min    Simonne Martinet ACNP-BC [facility] Pulmonary/Critical Care Pager # [phone] OR # [phone] if no answer

## 2023-07-29 NOTE — Progress Notes (Signed)
LTM EEG discontinued - no skin breakdown at unhook.   

## 2023-07-29 NOTE — Progress Notes (Signed)
Spoke w/ epileptology no seizures. Felt metabolic myoclonic in origin  Plan Stop keppra Get CT head

## 2023-07-29 NOTE — Progress Notes (Signed)
07/29/2023 Late in shift had some shaking episodes with question of SE on ceribell. Due to this we transferred to [facility] for LTVEEG. Called and updated spouse on transfer this am, she will be by to visit.  Myrla Halsted MD PCCM

## 2023-07-29 NOTE — Progress Notes (Signed)
Subjective: Has had multiple episodes of bilateral eye twitching.  During the episodes, patient is able to follow commands like sticking out his tongue occasionally his toes.  I was able to witness 1 of these episodes.   ROS: Unable to obtain due to intubation  Examination  Vital signs in last 24 hours: Temp:  [100.4 F (38 C)-102 F (38.9 C)] 100.8 F (38.2 C) (12/04 1200) Pulse Rate:  [29-143] 79 (12/04 0700) Resp:  [16-26] 20 (12/04 0700) BP: (85-154)/(34-120) 154/120 (12/04 0700) SpO2:  [84 %-98 %] 96 % (12/04 1134) FiO2 (%):  [40 %-80 %] 50 % (12/04 1134) Weight:  [196.8 kg] 196.8 kg (12/04 0500)  General: lying in bed, intubated Neuro: opens eyes to verbal stimulation, follows commands, PERLA, no gaze deviation, antigravity strength in all extremities. Had bilateral eye twitching during stimulation which resolved spontaneously   Basic Metabolic Panel: Recent Labs  Lab 07/24/23 2024 07/24/23 2248 07/25/23 0805 07/26/23 0816 07/26/23 1525 07/27/23 0500 07/27/23 1641 07/28/23 0400 07/28/23 1732 07/29/23 0433  NA 132*   < > 133* 135  --  139  --  143 139 143  K 5.6*   < > 5.3* 4.0  --  3.4*  --  3.7 3.7 4.1  CL 90*   < > 91* 88*  --  94*  --  96* 98 99  CO2 26  --  31 34*  --  33*  --  36* 34* 32  GLUCOSE 126*   < > 99 84  --  122*  --  168* 143* 169*  BUN 91*   < > 90* 77*  --  66*  --  77* 79* 82*  CREATININE 3.33*   < > 2.77* 1.83*  --  1.51*  --  1.52* 1.95* 2.33*  CALCIUM 8.5*  --  8.2* 8.4*  --  8.5*  --  8.5* 7.7* 8.1*  MG 2.7*  --  2.5* 1.9 2.0 1.9 1.9  --   --   --   PHOS 7.6*  --   --   --  3.7 2.9 3.4  --   --   --    < > = values in this interval not displayed.    CBC: Recent Labs  Lab 07/24/23 1558 07/24/23 1815 07/24/23 1815 07/24/23 2248 07/25/23 0805 07/26/23 1044 07/28/23 0400 07/29/23 0433  WBC  --  11.5*  --   --  8.2 10.8* 9.8 9.2  NEUTROABS Not Measured 8.8*  --   --   --  9.7*  --   --   HGB  --  15.6   < > 20.1* 15.2 16.9 15.8 15.3   HCT  --  52.0   < > 59.0* 48.7 54.3* 50.3 50.4  MCV  --  87.4  --   --  84.5 82.4 82.9 83.0  PLT  --  184  --   --  158 154 141* 128*   < > = values in this interval not displayed.     Coagulation Studies: No results for input(s): "LABPROT", "INR" in the last 72 hours.  Imaging personally reviewed  CT head without contrast 07/29/23:    ASSESSMENT AND PLAN: 67 year old male morbidly obese, osteoarthritis, admitted to [facility] with decompensated CHF, and initially on Bipap for hypercapnea and then intubated. While at [facility], he was noted to have intermittent facial twitching concerning for myoclonic jerks likely due to uremia, amiodarone  Myoclonus -No change on EEG.  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  Amiodarone is at times associated with myoclonus as well -Please try to wean amiodarone if possible -Management of uremia per primary team -Will hold off on starting antiseizure medications for now.  However if episodes persist or unable to wean amiodarone, can start Keppra 500 mg twice daily (please adjust dose renally) -DC LTM EEG -Discussed plan with ICU team  CRITICAL CARE Performed by: Charlsie Quest   Total critical care time: 32  minutes  Critical care time was exclusive of separately billable procedures and treating other patients.  Critical care was necessary to treat or prevent imminent or life-threatening deterioration.  Critical care was time spent personally by me on the following activities: development of treatment plan with patient and/or surrogate as well as nursing, discussions with consultants, evaluation of patient's response to treatment, examination of patient, obtaining history from patient or surrogate, ordering and performing treatments and interventions, ordering and review of laboratory studies, ordering and review of radiographic studies, pulse oximetry and re-evaluation of patient's condition.     Lindie Spruce Epilepsy [facility] For questions after 5pm please refer to AMION to reach the Neurologist on call

## 2023-07-29 NOTE — Progress Notes (Signed)
Pt transported to CT via vent with no complications noted. Pt returned to 4N ICU

## 2023-07-29 NOTE — Procedures (Signed)
[name]  MRN: [id number]  Epilepsy Attending: Charlsie Quest  Referring Physician/Provider: Kathlene Cote, PA-C  Duration: 07/28/2023 1831 to 2217  Patient history:  67 y.o. male morbidly obese, osteoarthritis, admitted to [facility] with decompensated CHF, and initially on Bipap for hypercapnea and then intubated. He was noted to have intermittent facial twitching concerning for myoclonic jerks vs seizure. EEG to evaluate for seizure   Level of alertness:  lethargic   AEDs during EEG study: None  Technical aspects: This EEG was obtained using a 10 lead EEG system positioned circumferentially without any parasagittal coverage (rapid EEG). Computer selected EEG is reviewed as  well as background features and all clinically significant events.  Description: EEG showed continuous/ generalized 3 to 6 Hz theta-delta slowing.  Event button was pressed on 07/28/2023 at 1832 and 1914 for bilateral eyelid twitching and facial twitching. Concomitant EEG  showed rhythmic sharply contoured waves in right temporal region without definite evolution   Hyperventilation and photic stimulation were not performed.     ABNORMALITY - Continuous slow, generalized  IMPRESSION: This limited ceribell EEG is suggestive of moderate diffuse encephalopathy.  Event button was pressed on 07/28/2023 at 1832 and 1914 for bilateral eyelid twitching and facial twitching. Concomitant EEG showed rhythmic sharply contoured waves in right temporal region without definite evolution. Due to limited electrodes, lack of video and no evolution seen on eeg, these events are most likely NOT epileptic. However, focal motor seizure may not be seen on ceribell. Recommend conventional eeg with video for further evaluation.  Dr Katrinka Blazing and Dr Derry Lory wee notified  Jandiel Magallanes Annabelle Harman

## 2023-07-29 NOTE — Progress Notes (Signed)
LTM EEG hooked up and running - no initial skin breakdown - push button tested - [facility] monitoring.  

## 2023-07-29 NOTE — Progress Notes (Signed)
PHARMACY - ANTICOAGULATION  Pharmacy Consult for heparin Indication: atrial fibrillation  No Known Allergies  Patient Measurements: Height: 5' 7.01" (170.2 cm) Weight: (!) 196.8 kg (433 lb 13.8 oz) IBW/kg (Calculated) : 66.12 Heparin Dosing Weight: 117 kg  Vital Signs: Temp: 101.8 F (38.8 C) (12/04 0800) Temp Source: Oral (12/04 0800) BP: 154/120 (12/04 0700) Pulse Rate: 79 (12/04 0700)  Labs: Recent Labs    07/28/23 0400 07/28/23 1732 07/29/23 0254 07/29/23 0433 07/29/23 0938  HGB 15.8  --   --  15.3  --   HCT 50.3  --   --  50.4  --   PLT 141*  --   --  128*  --   HEPARINUNFRC  --   --  0.35  --  0.42  CREATININE 1.52* 1.95*  --  2.33*  --     Estimated Creatinine Clearance: 50.8 mL/min (A) (by C-G formula based on SCr of 2.33 mg/dL (H)).  Assessment: 67 y.o. male with Afib. Pharmacy consulted for heparin infusion. No anticoagulants prior to admission  HL 0.42 - therapeutic CBC stable, Plt 128.   Goal of Therapy:  Heparin level 0.3-0.7 units/ml Monitor platelets by anticoagulation protocol: Yes   Plan:  Continue heparin at current rate: 1650 units/hr Daily HL, CBC F/u s/sx bleeding , anticoagulation plans longterm

## 2023-07-29 NOTE — Progress Notes (Signed)
PHARMACY - ANTICOAGULATION  Pharmacy Consult for heparin Indication: atrial fibrillation Brief A/P: Heparin level within goal range Continue Heparin at current rate   No Known Allergies  Patient Measurements: Height: 5' 7.01" (170.2 cm) Weight: (!) 197.4 kg (435 lb 3.2 oz) IBW/kg (Calculated) : 66.12 Heparin Dosing Weight: 117 kg  Vital Signs: Temp: 101.9 F (38.8 C) (12/03 2313) Temp Source: Axillary (12/03 2313) BP: 105/68 (12/04 0300) Pulse Rate: 69 (12/04 0300)  Labs: Recent Labs    07/26/23 1044 07/27/23 0500 07/28/23 0400 07/28/23 1732 07/29/23 0254  HGB 16.9  --  15.8  --   --   HCT 54.3*  --  50.3  --   --   PLT 154  --  141*  --   --   HEPARINUNFRC  --   --   --   --  0.35  CREATININE  --  1.51* 1.52* 1.95*  --     Estimated Creatinine Clearance: 60.8 mL/min (A) (by C-G formula based on SCr of 1.95 mg/dL (H)).  Assessment: 67 y.o. male with AFib for heparin  Goal of Therapy:  Heparin level 0.3-0.7 units/ml Monitor platelets by anticoagulation protocol: Yes   Plan:  No change to heparin for now Check heparin level in 6 hours.   Geannie Risen, PharmD, BCPS

## 2023-07-29 NOTE — Procedures (Addendum)
[name]  MRN: [id number]  Epilepsy Attending: Charlsie Quest  Referring Physician/Provider: Erick Blinks, MD  Duration: 07/29/2023 0419 to 1225  Patient history: 67 y.o. male morbidly obese, osteoarthritis, admitted to [facility] with decompensated CHF, and initially on Bipap for hypercapnea and then intubated. While at [facility], he was noted to have intermittent facial twitching concerning for myoclonic jerks vs seizure. EEG to evaluate for seizure  Level of alertness: comatose  AEDs during EEG study: LEV  Technical aspects: This EEG study was done with scalp electrodes positioned according to the 10-20 International system of electrode placement. Electrical activity was reviewed with band pass filter of 1-70Hz , sensitivity of 7 uV/mm, display speed of 63mm/sec with a 60Hz  notched filter applied as appropriate. EEG data were recorded continuously and digitally stored.  Video monitoring was available and reviewed as appropriate.  Description: EEG showed continuous generalized 3 to 5 Hz theta-delta slowing. Hyperventilation and photic stimulation were not performed.     On 07/29/2023 at around 0728, patient was noted to have right facial twitching.  This was after patient was suctioned.  Concomitant EEG before, during and after the event did not show any EEG changes suggest seizure.  Event button was pressed on 07/29/2023 at 0837 and 0843 for bilateral eyelid twitching. Concomitant EEG before, during and after the event did not show any EEG changes suggest seizure.  ABNORMALITY - Continuous slow, generalized  IMPRESSION: This study is suggestive of severe diffuse encephalopathy. No seizures or epileptiform discharges were seen throughout the recording.  On 07/29/2023 at around 0728, patient was noted to have right facial twitching without concomitant EEG change.  This was not an epileptic event.  Myoclonus can have similar appearance.  Clinical correlation is recommended.  Event  button was pressed on 07/29/2023 at 0837 and 0843 for bilateral eyelid twitching without concomitant EEG change.  This was not an epileptic event.  Myoclonus can have similar appearance.    Aldo Sondgeroth Annabelle Harman

## 2023-07-30 DIAGNOSIS — J9601 Acute respiratory failure with hypoxia: Secondary | ICD-10-CM | POA: Diagnosis not present

## 2023-07-30 DIAGNOSIS — J9602 Acute respiratory failure with hypercapnia: Secondary | ICD-10-CM | POA: Diagnosis not present

## 2023-07-30 DIAGNOSIS — G9341 Metabolic encephalopathy: Secondary | ICD-10-CM

## 2023-07-30 DIAGNOSIS — G253 Myoclonus: Secondary | ICD-10-CM | POA: Diagnosis not present

## 2023-07-30 DIAGNOSIS — I5040 Unspecified combined systolic (congestive) and diastolic (congestive) heart failure: Secondary | ICD-10-CM | POA: Diagnosis not present

## 2023-07-30 LAB — PROCALCITONIN: Procalcitonin: 4.51 ng/mL

## 2023-07-30 LAB — COMPREHENSIVE METABOLIC PANEL
ALT: 70 U/L — ABNORMAL HIGH (ref 0–44)
AST: 23 U/L (ref 15–41)
Albumin: 2.3 g/dL — ABNORMAL LOW (ref 3.5–5.0)
Alkaline Phosphatase: 65 U/L (ref 38–126)
Anion gap: 11 (ref 5–15)
BUN: 91 mg/dL — ABNORMAL HIGH (ref 8–23)
CO2: 32 mmol/L (ref 22–32)
Calcium: 8.2 mg/dL — ABNORMAL LOW (ref 8.9–10.3)
Chloride: 100 mmol/L (ref 98–111)
Creatinine, Ser: 2.05 mg/dL — ABNORMAL HIGH (ref 0.61–1.24)
GFR, Estimated: 35 mL/min — ABNORMAL LOW (ref >60)
Glucose, Bld: 251 mg/dL — ABNORMAL HIGH (ref 70–99)
Potassium: 3.4 mmol/L — ABNORMAL LOW (ref 3.5–5.1)
Sodium: 143 mmol/L (ref 135–145)
Total Bilirubin: 1.7 mg/dL — ABNORMAL HIGH (ref <1.2)
Total Protein: 6.1 g/dL — ABNORMAL LOW (ref 6.5–8.1)

## 2023-07-30 LAB — GLUCOSE, CAPILLARY
Glucose-Capillary: 221 mg/dL — ABNORMAL HIGH (ref 70–99)
Glucose-Capillary: 219 mg/dL — ABNORMAL HIGH (ref 70–99)
Glucose-Capillary: 155 mg/dL — ABNORMAL HIGH (ref 70–99)
Glucose-Capillary: 193 mg/dL — ABNORMAL HIGH (ref 70–99)
Glucose-Capillary: 187 mg/dL — ABNORMAL HIGH (ref 70–99)
Glucose-Capillary: 215 mg/dL — ABNORMAL HIGH (ref 70–99)

## 2023-07-30 LAB — CBC
HCT: 50.1 % (ref 39.0–52.0)
Hemoglobin: 15.1 g/dL (ref 13.0–17.0)
MCH: 24.8 pg — ABNORMAL LOW (ref 26.0–34.0)
MCHC: 30.1 g/dL (ref 30.0–36.0)
MCV: 82.4 fL (ref 80.0–100.0)
Platelets: 134 10*3/uL — ABNORMAL LOW (ref 150–400)
RBC: 6.08 MIL/uL — ABNORMAL HIGH (ref 4.22–5.81)
RDW: 18.8 % — ABNORMAL HIGH (ref 11.5–15.5)
WBC: 7.7 10*3/uL (ref 4.0–10.5)
nRBC: 0 % (ref 0.0–0.2)

## 2023-07-30 LAB — COOXEMETRY PANEL
Carboxyhemoglobin: 2.1 % — ABNORMAL HIGH (ref 0.5–1.5)
Methemoglobin: <0.7 % (ref 0.0–1.5)
O2 Saturation: 74.4 %
Total hemoglobin: 15.7 g/dL (ref 12.0–16.0)

## 2023-07-30 LAB — MAGNESIUM: Magnesium: 2.7 mg/dL — ABNORMAL HIGH (ref 1.7–2.4)

## 2023-07-30 LAB — BRAIN NATRIURETIC PEPTIDE: B Natriuretic Peptide: 245.3 pg/mL — ABNORMAL HIGH (ref 0.0–100.0)

## 2023-07-30 LAB — HEPARIN LEVEL (UNFRACTIONATED): Heparin Unfractionated: 0.43 [IU]/mL (ref 0.30–0.70)

## 2023-07-30 MED ORDER — INSULIN GLARGINE-YFGN 100 UNIT/ML ~~LOC~~ SOLN
5.0000 [IU] | Freq: Two times a day (BID) | SUBCUTANEOUS | Status: DC
  Administered 2023-07-30 – 2023-07-31 (×3): 5 [IU] via SUBCUTANEOUS
  Filled 2023-07-30 (×4): qty 0.05

## 2023-07-30 MED ORDER — POLYETHYLENE GLYCOL 3350 17 G PO PACK
17.0000 g | PACK | Freq: Two times a day (BID) | ORAL | Status: DC
  Administered 2023-07-30 – 2023-08-02 (×3): 17 g
  Filled 2023-07-30 (×3): qty 1

## 2023-07-30 MED ORDER — FENTANYL CITRATE PF 50 MCG/ML IJ SOSY: 25.0000 ug | PREFILLED_SYRINGE | INTRAMUSCULAR | Status: DC | PRN

## 2023-07-30 MED ORDER — SENNA 8.6 MG PO TABS
1.0000 | ORAL_TABLET | Freq: Two times a day (BID) | ORAL | Status: DC
  Administered 2023-07-30: 8.6 mg
  Filled 2023-07-30 (×2): qty 1

## 2023-07-30 MED ORDER — FENTANYL CITRATE PF 50 MCG/ML IJ SOSY
25.0000 ug | PREFILLED_SYRINGE | INTRAMUSCULAR | Status: DC | PRN
  Administered 2023-07-30: 100 ug via INTRAVENOUS
  Administered 2023-07-30: 50 ug via INTRAVENOUS
  Administered 2023-07-30: 100 ug via INTRAVENOUS
  Filled 2023-07-30 (×2): qty 2
  Filled 2023-07-30: qty 1

## 2023-07-30 MED ORDER — GERHARDT'S BUTT CREAM
TOPICAL_CREAM | Freq: Two times a day (BID) | CUTANEOUS | Status: DC
  Administered 2023-07-31 – 2023-08-07 (×7): 1 via TOPICAL
  Filled 2023-07-30 (×3): qty 60

## 2023-07-30 MED ORDER — ALUMINUM-PETROLATUM-ZINC (1-2-3 PASTE) 0.027-13.7-10% PASTE
1.0000 | PASTE | Freq: Three times a day (TID) | CUTANEOUS | Status: AC
  Administered 2023-07-30 – 2023-08-01 (×3): 1 via TOPICAL
  Filled 2023-07-30: qty 120

## 2023-07-30 MED ORDER — SODIUM CHLORIDE 0.9 % IV SOLN
2.0000 g | INTRAVENOUS | Status: AC
  Administered 2023-07-30 – 2023-08-05 (×7): 2 g via INTRAVENOUS
  Filled 2023-07-30 (×7): qty 20

## 2023-07-30 MED ORDER — SODIUM CHLORIDE 0.9 % IV SOLN: 2.0000 g | INTRAVENOUS | Status: DC

## 2023-07-30 MED ORDER — INSULIN ASPART 100 UNIT/ML IJ SOLN
0.0000 [IU] | INTRAMUSCULAR | Status: DC
  Administered 2023-07-30: 7 [IU] via SUBCUTANEOUS
  Administered 2023-07-30 (×3): 4 [IU] via SUBCUTANEOUS
  Administered 2023-07-31: 7 [IU] via SUBCUTANEOUS
  Administered 2023-07-31: 4 [IU] via SUBCUTANEOUS
  Administered 2023-07-31: 7 [IU] via SUBCUTANEOUS
  Administered 2023-07-31: 4 [IU] via SUBCUTANEOUS
  Administered 2023-07-31: 7 [IU] via SUBCUTANEOUS
  Administered 2023-08-01 – 2023-08-02 (×3): 3 [IU] via SUBCUTANEOUS
  Administered 2023-08-03: 4 [IU] via SUBCUTANEOUS
  Administered 2023-08-03: 3 [IU] via SUBCUTANEOUS
  Administered 2023-08-03: 4 [IU] via SUBCUTANEOUS
  Administered 2023-08-03: 3 [IU] via SUBCUTANEOUS
  Administered 2023-08-03: 4 [IU] via SUBCUTANEOUS
  Administered 2023-08-04 – 2023-08-05 (×3): 3 [IU] via SUBCUTANEOUS
  Administered 2023-08-05: 4 [IU] via SUBCUTANEOUS
  Administered 2023-08-05 (×3): 3 [IU] via SUBCUTANEOUS
  Administered 2023-08-05: 4 [IU] via SUBCUTANEOUS
  Administered 2023-08-06: 3 [IU] via SUBCUTANEOUS
  Administered 2023-08-06 (×2): 4 [IU] via SUBCUTANEOUS
  Administered 2023-08-06 (×3): 3 [IU] via SUBCUTANEOUS
  Administered 2023-08-07: 2 [IU] via SUBCUTANEOUS
  Administered 2023-08-07: 4 [IU] via SUBCUTANEOUS
  Administered 2023-08-07 (×4): 3 [IU] via SUBCUTANEOUS
  Administered 2023-08-08 (×2): 4 [IU] via SUBCUTANEOUS

## 2023-07-30 MED ORDER — POTASSIUM CHLORIDE 20 MEQ PO PACK
60.0000 meq | PACK | Freq: Once | ORAL | Status: AC
  Administered 2023-07-30: 60 meq
  Filled 2023-07-30: qty 3

## 2023-07-30 MED ORDER — GERHARDT'S BUTT CREAM
TOPICAL_CREAM | Freq: Two times a day (BID) | CUTANEOUS | Status: DC
  Filled 2023-07-30: qty 60

## 2023-07-30 NOTE — Progress Notes (Signed)
eLink Physician-Brief Progress Note [name] DOB: 10-26-1955 MRN: [id number]   Date of Service  07/30/2023  HPI/Events of Note  K is 3.4.    eICU Interventions  Kcl OG x1     Intervention Category Minor Interventions: Electrolytes abnormality - evaluation and management  Joyice Magda 07/30/2023, 6:38 AM

## 2023-07-30 NOTE — Progress Notes (Signed)
Subjective: More awake, calm and following commands this morning.  Per RN at times has bilateral eye twitching with predominantly active stimulation and not sustained.  ROS: Unable to obtain due to intubation  Examination  Vital signs in last 24 hours: Temp:  [98.4 F (36.9 C)-100.8 F (38.2 C)] 98.4 F (36.9 C) (12/05 1200) Pulse Rate:  [47-70] 65 (12/05 1200) Resp:  [16-21] 20 (12/05 1200) BP: (106-151)/(59-106) 111/73 (12/05 1200) SpO2:  [90 %-97 %] 97 % (12/05 1200) FiO2 (%):  [40 %] 40 % (12/05 1049) Weight:  [161 kg] 193 kg (12/05 0500)  General: lying in bed, intubated Neuro: Awake, alert, follows commands, PERLA, no gaze deviation, antigravity strength in all extremities.   Basic Metabolic Panel: Recent Labs  Lab 07/24/23 2024 07/24/23 2248 07/26/23 0816 07/26/23 1525 07/27/23 0500 07/27/23 1641 07/28/23 0400 07/28/23 1732 07/29/23 0433 07/30/23 0511 07/30/23 0853  NA 132*   < > 135  --  139  --  143 139 143 143  --   K 5.6*   < > 4.0  --  3.4*  --  3.7 3.7 4.1 3.4*  --   CL 90*   < > 88*  --  94*  --  96* 98 99 100  --   CO2 26   < > 34*  --  33*  --  36* 34* 32 32  --   GLUCOSE 126*   < > 84  --  122*  --  168* 143* 169* 251*  --   BUN 91*   < > 77*  --  66*  --  77* 79* 82* 91*  --   CREATININE 3.33*   < > 1.83*  --  1.51*  --  1.52* 1.95* 2.33* 2.05*  --   CALCIUM 8.5*   < > 8.4*  --  8.5*  --  8.5* 7.7* 8.1* 8.2*  --   MG 2.7*   < > 1.9 2.0 1.9 1.9  --   --   --   --  2.7*  PHOS 7.6*  --   --  3.7 2.9 3.4  --   --   --   --   --    < > = values in this interval not displayed.    CBC: Recent Labs  Lab  0000 07/24/23 1558 07/24/23 1815 07/24/23 2248 07/25/23 0805 07/26/23 1044 07/28/23 0400 07/29/23 0433 07/30/23 0511  WBC   < >  --  11.5*  --  8.2 10.8* 9.8 9.2 7.7  NEUTROABS  --  Not Measured 8.8*  --   --  9.7*  --   --   --   HGB  --   --  15.6   < > 15.2 16.9 15.8 15.3 15.1  HCT  --   --  52.0   < > 48.7 54.3* 50.3 50.4 50.1  MCV   < >   --  87.4  --  84.5 82.4 82.9 83.0 82.4  PLT   < >  --  184  --  158 154 141* 128* 134*   < > = values in this interval not displayed.     Coagulation Studies: No results for input(s): "LABPROT", "INR" in the last 72 hours.  Imaging personally reviewed  CT head without contrast 07/29/2023: No acute abnormality   ASSESSMENT AND PLAN: 67 year old male morbidly obese, osteoarthritis, admitted to [facility] with decompensated CHF, and initially on Bipap for hypercapnea and then intubated. While at [facility],  he was noted to have intermittent facial twitching concerning for myoclonic jerks likely due to uremia, amiodarone   Myoclonus, improving -No change on EEG.  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  Amiodarone is at times associated with myoclonus as well -If myoclonus recurs/worsens, can try to wean amiodarone if possible  -Management of uremia per primary team -Will hold off on starting antiseizure medications for now.  However if episodes persist or unable to wean amiodarone, can start Keppra 500 mg twice daily (please adjust dose renally) -Neurology will sign off.  Please call us back for any further questions  I have spent a total of   36  minutes with the patient reviewing hospital notes,  test results, labs and examining the patient as well as establishing an assessment and plan that was discussed personally with the patient.  > 50% of time was spent in direct patient care.         Lindie Spruce Epilepsy [facility] For questions after 5pm please refer to AMION to reach the Neurologist on call

## 2023-07-30 NOTE — Progress Notes (Addendum)
NAME:  Levi [name], MRN:  [id number], DOB:  [birth year], LOS: 6 ADMISSION DATE:  07/24/2023, CONSULTATION DATE: 07/24/2023  REFERRING MD: Marlene Bast, CHIEF COMPLAINT: Shortness of breath and hypoxia  History of Present Illness:  A 67 yr old male patient with morbid obesity, probably untreated OSA/OHS, and knee OA, who presented to ED with dry cough, DOE, SOB, LL edema, orthopnea for 1-2 weeks. Home health RN found his SpO2 64% on RA. Given a dose of Albuterol. Denied wheezing, CP, N/V/D, abd pain, f/c/r, and rash. Not on home O2 or NIV.  No smoking, illicit drug use, or alcoholism.  No hx of COPD, asthma, VTE, cardiac hx, liver disease, or CKD Developed runs of VT in ED. No EKG changes initially.   Pertinent  Medical History  History reviewed. No pertinent past medical history.  Significant Hospital Events: Including procedures, antibiotic start and stop dates in addition to other pertinent events   11/29 admitted 12/3 trach aspirate sent for thick secretion. Amiodarone started for AF w/ bigeminy and trigeminy. New onset rhythmic twitching left face, LLE. Resolved spont.  12/4 Seen by neuro: myoclonic jerks noted on Ceribell monitor but not able to distinguish if seizure activity. Moved to [facility] for LTM. Started Keppra.  Febrile. Sats 88%. LEUS negative for DVT. LTM neg for seizure so keppra stopped later that day.  Had been started on vanc and zosyn 12/3 for fever CT was neg for stroke. Vanc stopped 12/5 sputum H flu BL positive. Change to CTX. More awake. Trying to wean sedation to assess weaning ability. Renal fxn improved after holding lasix   Interim History / Subjective:  More awake. Overall looks generally a little better  Objective   Blood pressure (!) 107/90, pulse (!) 59, temperature 99.7 F (37.6 C), temperature source Axillary, resp. rate 18, height 5' 7.01" (1.702 m), weight (!) 193 kg, SpO2 93%. CVP:  [16 mmHg-20 mmHg] 18 mmHg  Vent Mode: PRVC FiO2 (%):  [40 %-50 %]  40 % Set Rate:  [20 bmp] 20 bmp Vt Set:  [500 mL] 500 mL PEEP:  [5 cmH20-8 cmH20] 8 cmH20 Plateau Pressure:  [21 cmH20-26 cmH20] 25 cmH20   Intake/Output Summary (Last 24 hours) at 07/30/2023 0756 Last data filed at 07/30/2023 0700 Gross per 24 hour  Intake 3476.05 ml  Output 2275 ml  Net 1201.05 ml   Filed Weights   07/28/23 0218 07/29/23 0500 07/30/23 0500  Weight: (!) 197.4 kg (!) 196.8 kg (!) 193 kg    Examination: General more awake today . Opens eyes. Tracks  HENT NCAT neck is large orally intubated Pulm course equal breath sounds. Triggered apnea alarm on SBT attempt.  Card brady RRR (on amio and dex infusions) Abd large + bowel sounds Ext chronic LE edema. Warm  Neuro more awake. Tracking. Moving all ext. Not following commands currently but we are actively weaning sedation   Assessment & Plan:   Acute on chronic systolic heart failure - echo from 11/30 with EF <20%, G1DD, severely reduced RVSF, severely enlarged RV Plan Cont tele  Trend CVP 15-18 not sure what that means volume wise for him but holding lasix seemed to help his renal fxn Repeat co-ox, was 63, might consider some inotropic support (note this could exacerbate his tachycardia) Cardiology following, appreciate the assistance; Considering R/LHC  GDMT once extubated Will need get him established with AHF clinic  Acute Hypoxic and Hypercapnic respiratory failure -secondary to pulmonary edema and complicated further by BL positive Haemophilus influ  PNA (12/3) Presumed OSA/OHS PCT trending down. Tmax 101.8. 102.5 Plan Day 3 zosyn. Will treat for 5 days, change to CTX/ Weaning sedation. Goal RASS 0 to -1 Daily assessment for SBT (given body habitus will need to wean on PEEP of 8 and anticipate BIPAP post extubation) Cont HT saline nebs and chest PT VAP bundle   New onset AF w/ intermittent ventricular ectopy: CHA2DS2/VAS score 2.  bigeminy/trigeminy VT -started on amio and heparin 12/3. Currently  SB Plan Cont tele  Cont amio for now. Reluctant to stop as we wean dex as dex may be also contributing to his bradycardia  Cont IV heparin  K > 4, Mg > 2 goal   Possible seizure. Non witnessed by LTM. Felt metabolic in nature. CT head obtained and it was reassuring w/out new clear stroke/injury Plan Monitor off Keppra which was dc'd 12/4  Acute metabolic encephalopathy  Improved c/w yesterday. Suspect uremia and sedation  Plan Weaning dex Change fent to PRN Cont supportive care   Acute kidney injury w/ rising scr in context of on-going diuresis  Renal fxn improved w/ holding lasix Plan Cont to keep euvolemic (this is difficult to tell where his volume status is) Cont to renal dose meds  Optimize CO and ensure MAP > 65 Strict I&O  Congestive hepatopathy Improving Plan Continue supportive care will change to intermittent checks  Hyperglycemia w/ sub-optimal glycemic control as of 12/5 Plan SSI goal 140-180  For now change SSI to resistant and hold off on basal as assessing for weaning & if extubated would then be NPO  Morbid Obesity Plan Weight loss needed  Best Practice (right click and "Reselect all SmartList Selections" daily)   Diet/type: tubefeeds DVT prophylaxis: IV heparin started 12/3   Pressure ulcer(s): not present on admission  GI prophylaxis: H2B Lines: Central line Foley:  Yes, and it is still needed Code Status:  full code Last date of multidisciplinary goals of care discussion [discussed with patient's spouse and brother at bedside 12/1]   Cct 32 min    Simonne Martinet ACNP-BC [facility] Pulmonary/Critical Care Pager # [phone] OR # [phone] if no answer

## 2023-07-30 NOTE — Inpatient Diabetes Management (Signed)
Inpatient Diabetes Program Recommendations  AACE/ADA: New Consensus Statement on Inpatient Glycemic Control (2015)  Target Ranges:  Prepandial:   less than 140 mg/dL      Peak postprandial:   less than 180 mg/dL (1-2 hours)      Critically ill patients:  140 - 180 mg/dL   Lab Results  Component Value Date   GLUCAP 155 (H) 07/30/2023   HGBA1C 7.5 (H) 07/25/2023    Review of Glycemic Control  Latest Reference Range & Units 07/29/23 15:42 07/29/23 19:24 07/29/23 23:12 07/30/23 03:27 07/30/23 07:47 07/30/23 11:25  Glucose-Capillary 70 - 99 mg/dL 098 (H) 119 (H) 147 (H) 221 (H) 219 (H) 155 (H)   Diabetes history: ? New diagnosis of DM with A1C=7.5%  Current orders for Inpatient glycemic control:  Novolog 0-20 units q 4 hours Semglee 5 units bid (just added) Pivot 1.5 cal at 40 ml/hr  Inpatient Diabetes Program Recommendations:    Agree with current orders. Will need to follow for newly elevated A1C.   Thanks,  Beryl Meager, RN, BC-ADM Inpatient Diabetes Coordinator Pager [phone]  (8a-5p)

## 2023-07-31 ENCOUNTER — Inpatient Hospital Stay (HOSPITAL_COMMUNITY): Payer: Medicare HMO

## 2023-07-31 DIAGNOSIS — I5021 Acute systolic (congestive) heart failure: Secondary | ICD-10-CM | POA: Diagnosis not present

## 2023-07-31 DIAGNOSIS — I5043 Acute on chronic combined systolic (congestive) and diastolic (congestive) heart failure: Principal | ICD-10-CM

## 2023-07-31 DIAGNOSIS — N179 Acute kidney failure, unspecified: Secondary | ICD-10-CM | POA: Diagnosis not present

## 2023-07-31 DIAGNOSIS — I429 Cardiomyopathy, unspecified: Secondary | ICD-10-CM | POA: Diagnosis not present

## 2023-07-31 DIAGNOSIS — J9601 Acute respiratory failure with hypoxia: Secondary | ICD-10-CM | POA: Diagnosis not present

## 2023-07-31 DIAGNOSIS — I5082 Biventricular heart failure: Secondary | ICD-10-CM | POA: Diagnosis not present

## 2023-07-31 LAB — GLUCOSE, CAPILLARY
Glucose-Capillary: 206 mg/dL — ABNORMAL HIGH (ref 70–99)
Glucose-Capillary: 227 mg/dL — ABNORMAL HIGH (ref 70–99)
Glucose-Capillary: 200 mg/dL — ABNORMAL HIGH (ref 70–99)
Glucose-Capillary: 187 mg/dL — ABNORMAL HIGH (ref 70–99)
Glucose-Capillary: 165 mg/dL — ABNORMAL HIGH (ref 70–99)
Glucose-Capillary: 72 mg/dL (ref 70–99)

## 2023-07-31 LAB — CBC
HCT: 49.2 % (ref 39.0–52.0)
Hemoglobin: 14.5 g/dL (ref 13.0–17.0)
MCH: 24.9 pg — ABNORMAL LOW (ref 26.0–34.0)
MCHC: 29.5 g/dL — ABNORMAL LOW (ref 30.0–36.0)
MCV: 84.5 fL (ref 80.0–100.0)
Platelets: 133 10*3/uL — ABNORMAL LOW (ref 150–400)
RBC: 5.82 MIL/uL — ABNORMAL HIGH (ref 4.22–5.81)
RDW: 19 % — ABNORMAL HIGH (ref 11.5–15.5)
WBC: 7.2 10*3/uL (ref 4.0–10.5)
nRBC: 0 % (ref 0.0–0.2)

## 2023-07-31 LAB — COMPREHENSIVE METABOLIC PANEL
ALT: 51 U/L — ABNORMAL HIGH (ref 0–44)
AST: 17 U/L (ref 15–41)
Albumin: 2.1 g/dL — ABNORMAL LOW (ref 3.5–5.0)
Alkaline Phosphatase: 56 U/L (ref 38–126)
Anion gap: 10 (ref 5–15)
BUN: 68 mg/dL — ABNORMAL HIGH (ref 8–23)
CO2: 29 mmol/L (ref 22–32)
Calcium: 8.1 mg/dL — ABNORMAL LOW (ref 8.9–10.3)
Chloride: 110 mmol/L (ref 98–111)
Creatinine, Ser: 1.42 mg/dL — ABNORMAL HIGH (ref 0.61–1.24)
GFR, Estimated: 54 mL/min — ABNORMAL LOW (ref >60)
Glucose, Bld: 235 mg/dL — ABNORMAL HIGH (ref 70–99)
Potassium: 3.5 mmol/L (ref 3.5–5.1)
Sodium: 149 mmol/L — ABNORMAL HIGH (ref 135–145)
Total Bilirubin: 1.2 mg/dL — ABNORMAL HIGH (ref <1.2)
Total Protein: 5.7 g/dL — ABNORMAL LOW (ref 6.5–8.1)

## 2023-07-31 LAB — HEPARIN LEVEL (UNFRACTIONATED)
Heparin Unfractionated: 0.27 [IU]/mL — ABNORMAL LOW (ref 0.30–0.70)
Heparin Unfractionated: 0.3 [IU]/mL (ref 0.30–0.70)

## 2023-07-31 LAB — BRAIN NATRIURETIC PEPTIDE: B Natriuretic Peptide: 329.6 pg/mL — ABNORMAL HIGH (ref 0.0–100.0)

## 2023-07-31 MED ORDER — AMIODARONE HCL 200 MG PO TABS
200.0000 mg | ORAL_TABLET | Freq: Every day | ORAL | Status: DC
  Administered 2023-07-31 – 2023-08-02 (×2): 200 mg
  Filled 2023-07-31 (×2): qty 1

## 2023-07-31 MED ORDER — INSULIN GLARGINE-YFGN 100 UNIT/ML ~~LOC~~ SOLN
8.0000 [IU] | Freq: Two times a day (BID) | SUBCUTANEOUS | Status: DC
  Filled 2023-07-31 (×3): qty 0.08

## 2023-07-31 MED ORDER — POTASSIUM CHLORIDE 20 MEQ PO PACK
40.0000 meq | PACK | Freq: Once | ORAL | Status: AC
  Administered 2023-07-31: 40 meq
  Filled 2023-07-31: qty 2

## 2023-07-31 NOTE — Progress Notes (Signed)
Dr. Merrily Pew reminded at this time about pt not having nutritional route d/t failing swallow screen.

## 2023-07-31 NOTE — Progress Notes (Signed)
[facility] ADULT ICU REPLACEMENT PROTOCOL   The patient does apply for the [facility] Adult ICU Electrolyte Replacment Protocol based on the criteria listed below:   1.Exclusion criteria: TCTS, ECMO, Dialysis, and Myasthenia Gravis patients 2. Is GFR >/= 30 ml/min? Yes.    Patient's GFR today is 54 3. Is SCr </= 2? Yes.   Patient's SCr is 1.42 mg/dL 4. Did SCr increase >/= 0.5 in 24 hours? No. 5.Pt's weight >40kg  Yes.   6. Abnormal electrolyte(s): K+ =3.5  7. Electrolytes replaced per protocol 8.  Call MD STAT for K+ </= 2.5, Phos </= 1, or Mag </= 1 Physician:  Delia Chimes, eMD  Faye Ramsay 07/31/2023 5:35 AM

## 2023-07-31 NOTE — Progress Notes (Signed)
Pt's TF stopped at 1525, no way to zero infusion in EPIC that would account for pt's input.

## 2023-07-31 NOTE — Progress Notes (Signed)
PHARMACY - ANTICOAGULATION  Pharmacy Consult for heparin Indication: atrial fibrillation  No Known Allergies  Patient Measurements: Height: 5' 7.01" (170.2 cm) Weight: (!) 191.9 kg (423 lb 1 oz) IBW/kg (Calculated) : 66.12 Heparin Dosing Weight: 117 kg  Vital Signs: Temp: 98.8 F (37.1 C) (12/06 1200) Temp Source: Axillary (12/06 1200) BP: 126/78 (12/06 1200) Pulse Rate: 58 (12/06 1200)  Labs: Recent Labs    07/29/23 0433 07/29/23 0938 07/30/23 0511 07/31/23 0430 07/31/23 1205  HGB 15.3  --  15.1 14.5  --   HCT 50.4  --  50.1 49.2  --   PLT 128*  --  134* 133*  --   HEPARINUNFRC  --    < > 0.43 0.27* 0.30  CREATININE 2.33*  --  2.05* 1.42*  --    < > = values in this interval not displayed.    Estimated Creatinine Clearance: 82 mL/min (A) (by C-G formula based on SCr of 1.42 mg/dL (H)).  Assessment: 67 y.o. male with Afib. Pharmacy consulted for heparin infusion. No anticoagulants prior to admission  HL 0.30 - therapeutic, no issues running continuously per RN CBC stable, Plt 133.   Goal of Therapy:  Heparin level 0.3-0.7 units/ml Monitor platelets by anticoagulation protocol: Yes   Plan:  Increase heparin slightly to 1900 units/hr to ensure maintenance of therapeutic level Daily HL, CBC F/u s/sx bleeding , anticoagulation plans longterm  Calton Dach, PharmD, BCCCP Clinical Pharmacist 07/31/2023 1:36 PM

## 2023-07-31 NOTE — Progress Notes (Signed)
NAME:  Levi [name], MRN:  [id number], DOB:  15-Mar-1956, LOS: 7 ADMISSION DATE:  07/24/2023, CONSULTATION DATE: 07/24/2023  REFERRING MD: Marlene Bast, CHIEF COMPLAINT: Shortness of breath and hypoxia  History of Present Illness:  67 year old man with morbid obesity, probably untreated OSA/OHS and knee OA who presented to ED with dry cough, DOE, SOB, LL edema, orthopnea for 1-2 weeks. Home health RN found his SpO2 64% on RA. Given a dose of Albuterol. Denied wheezing, CP, N/V/D, abd pain, f/c/r, and rash. Not on home O2 or NIV.  No smoking, illicit drug use, or alcoholism.  No hx of COPD, asthma, VTE, cardiac hx, liver disease, or CKD Developed runs of VT in ED. No EKG changes initially.   Pertinent Medical History:  History reviewed. No pertinent past medical history.  Significant Hospital Events: Including procedures, antibiotic start and stop dates in addition to other pertinent events   11/29 Admitted to [facility] 12/3 Trach aspirate sent for thick secretions. Amiodarone started for AF w/ bigeminy and trigeminy. New onset rhythmic twitching left face, LLE. Resolved spontaneously. 12/4 Seen by Neuro: myoclonic jerks noted on Ceribell monitor but not able to distinguish if seizure activity. Transferred to [facility] for LTM. Started Keppra.  Febrile. Sats 88%. LEUS negative for DVT. LTM neg for seizure so Keppra stopped later that day.  Had been started on vanc and zosyn 12/3 for fever CT was neg for stroke. Vanc stopped 12/5 sputum H flu BL positive. Change to CTX. More awake. Trying to wean sedation to assess weaning ability. Renal fxn improved after holding Lasix.  Interim History / Subjective:  No significant events overnight Remains on Precedex (1.1 this AM) Following commands Nearing readiness for extubation, needs to wean prior to Mental status improved overall  Objective:  Blood pressure (!) 142/85, pulse (!) 56, temperature 98 F (36.7 C), temperature source Axillary, resp. rate (!)  24, height 5' 7.01" (1.702 m), weight (!) 191.9 kg, SpO2 96%. CVP:  [18 mmHg-23 mmHg] 22 mmHg  Vent Mode: PRVC FiO2 (%):  [40 %] 40 % Set Rate:  [20 bmp] 20 bmp Vt Set:  [500 mL] 500 mL PEEP:  [8 cmH20] 8 cmH20 Plateau Pressure:  [24 cmH20-25 cmH20] 25 cmH20   Intake/Output Summary (Last 24 hours) at 07/31/2023 0742 Last data filed at 07/31/2023 0700 Gross per 24 hour  Intake 2963.52 ml  Output 3855 ml  Net -891.48 ml   Filed Weights   07/29/23 0500 07/30/23 0500 07/31/23 0500  Weight: (!) 196.8 kg (!) 193 kg (!) 191.9 kg   Physical Examination: General: Acute-on-chronically ill-appearing middle-aged man in NAD. Intubated, sedated. HEENT: [place]/AT, anicteric sclera, PERRL, moist mucous membranes. Neuro: Awake, unable to assess orientation due to ETT. Responds to verbal stimuli. Following commands consistently. Moves all 4 extremities spontaneously. Generalized weakness. +Cough and +Gag  CV: Mildly bradycardic, regular rhythm, no m/g/r. PULM: Breathing even and unlabored on vent (PEEP 8, FiO2 40%). Lung fields diminished at bases, otherwise CTAB. GI: Obese, soft, nontender, nondistended. Hypoactive bowel sounds. Extremities: Bilateral symmetric 1+ chronic LE edema noted. Skin: Warm/dry, no rashes.  Assessment & Plan:   Acute-on-chronic systolic heart failure Echo from 11/30 with EF <20%, G1DD, severely reduced RVSF, severely enlarged RV - Cardiology following, appreciate assistance; considering R/LHC when more stable - GDMT once extubated - Follow Co-ox, last 74.4, not requiring inotropic support at present - Trend CVP - Cardiac monitoring/tele - Establish with AHF clinic as outpatient  Acute Hypoxic and Hypercapnic respiratory failure -secondary to  pulmonary edema and complicated further by BL positive Haemophilus influenza PNA (12/3) Presumed OSA/OHS PCT trending down. Tmax 101.8. 102.51F. - Antibiotics x 5-day course (end 12/7), initially Zosyn x 3 days transitioned to  ceftriaxone - Continue full vent support (4-8cc/kg IBW) - Wean FiO2 for O2 sat > 90% - Daily WUA/SBT; given body habitus recommend weaning on PEEP 8 and extubation to BiPAP when appropriate, reattempt wean today 12/6 with possible trial of extubation pending progress - VAP bundle - Pulmonary hygiene, chest PT, hypertonic saline nebs - PAD protocol for sedation: Precedex, Fentanyl, and Versed for goal RASS 0 to -1  New onset AF w/ intermittent ventricular ectopy, CHA2DS2/VASC score 2.  Bigeminy/trigeminy VT Started on amio and heparin 12/3. Currently SB. - Cardiac monitoring - Optimize electrolytes for K > 4, Mg > 2 - Continue amiodarone  Possible seizure. Non witnessed by LTM. Felt metabolic in nature. CT head obtained and it was reassuring w/out new clear stroke/injury - Monitor off of AEDs - Keppra discontinued 12/4  Acute metabolic encephalopathy  Improved c/w yesterday. Suspect uremia and sedation.  - Weaning sedation at able - Fentanyl to PRN - Correct metabolic derangements as able - Supportive care  Acute kidney injury w/ rising Cr in context of on-going diuresis  Renal fxn improved w/ holding lasix. - Trend BMP, Cr improving - Replete electrolytes as indicated - Monitor I&Os, goal euvolemia - Avoid nephrotoxic agents as able - Ensure adequate renal perfusion  Congestive hepatopathy, improving - Supportive care - Intermittent LFT monitoring  Hyperglycemia w/ sub-optimal glycemic control as of 12/5 - SSI - CBGs Q4H - Goal CBG 140-180  Morbid Obesity - Counseling re: weight loss/lifestyle changes  Best Practice: (right click and "Reselect all SmartList Selections" daily)   Diet/type: tubefeeds DVT prophylaxis: IV heparin started 12/3   Pressure ulcer(s): not present on admission  GI prophylaxis: H2B Lines: Central line Foley:  Yes, and it is still needed Code Status:  full code Last date of multidisciplinary goals of care discussion [discussed with  patient's spouse and brother at bedside 12/1]  Critical care time:   The patient is critically ill with multiple organ system failure and requires high complexity decision making for assessment and support, frequent evaluation and titration of therapies, advanced monitoring, review of radiographic studies and interpretation of complex data.   Critical Care Time devoted to patient care services, exclusive of separately billable procedures, described in this note is 38 minutes.  Tim Lair, PA-C [facility] 07/31/23 7:43 AM  Please see [email or web address] for pager details.  From 7A-7P if no response, please call [phone] After hours, please call ELink [phone]\

## 2023-07-31 NOTE — Progress Notes (Signed)
Pt extubated, failed swallow screen, w/ not nutrition or nutritional route (NGT ect.). Dr. Merrily Pew and Pecola Leisure, [place] informed.

## 2023-07-31 NOTE — Progress Notes (Signed)
[name] Date of Encounter: 07/31/2023 [facility] Cardiologist: None   Interval Summary  .    Remains intubated. Wake moves his head to answer questions  Brother at bedside  Wean trial started - per RN  Good UOP over last 24hrs  CVP likely not accurate Net IO Since Admission: -10,449.08 mL [07/31/23 1211]  Vital Signs .    Vitals:   07/31/23 0800 07/31/23 0900 07/31/23 1000 07/31/23 1100  BP: 127/80 132/82 135/84 106/65  Pulse: (!) 56 (!) 56 (!) 55 (!) 56  Resp: 20 20 20 18   Temp: 98 F (36.7 C)     TempSrc: Axillary     SpO2: 93% 94% 94% 98%  Weight:      Height:        Intake/Output Summary (Last 24 hours) at 07/31/2023 1211 Last data filed at 07/31/2023 1100 Gross per 24 hour  Intake 2907.64 ml  Output 2970 ml  Net -62.36 ml      07/31/2023    5:00 AM 07/30/2023    5:00 AM 07/29/2023    5:00 AM  Last 3 Weights  Weight (lbs) 423 lb 1 oz 425 lb 7.8 oz 433 lb 13.8 oz  Weight (kg) 191.9 kg 193 kg 196.8 kg      Telemetry/ECG    Predominantly sinus rhythm with short episodes of PAF- Personally Reviewed  CV Studies    Echocardiogram 07/25/2023 1. Left ventricular ejection fraction, by estimation, is <20%. The left  ventricle has severely decreased function. The left ventricle demonstrates  global hypokinesis. The left ventricular internal cavity size was mildly  dilated. There is mild concentric   left ventricular hypertrophy. Left ventricular diastolic parameters are  consistent with Grade I diastolic dysfunction (impaired relaxation).   2. Right ventricular systolic function is severely reduced. The right  ventricular size is severely enlarged. There is normal pulmonary artery  systolic pressure. The estimated right ventricular systolic pressure is  29.4 mmHg.   3. The mitral valve is grossly normal. No evidence of mitral valve  regurgitation. No evidence of mitral stenosis.   4. The aortic valve is tricuspid. Aortic valve  regurgitation is trivial.  No aortic stenosis is present.   5. There is mild dilatation of the ascending aorta, measuring 41 mm.   6. The inferior vena cava is dilated in size with <50% respiratory  variability, suggesting right atrial pressure of 15 mmHg.   Comparison(s): No prior Echocardiogram.   Physical Exam .   General: Intubated, awake, nods to questions, acutely ill, appears older than stated age. HEENT: OG tube in place, ET tube in place, left central line IJ in place Heart: Regular, positive S1-S2, no murmurs rubs or gallops appreciated Lungs: Mechanical breath sounds bilaterally Abdomen: Soft, nontender, nondistended, positive bowel sounds in all 4 quadrants, rectal tube in place  Extremities: No edema, upper extremities in restraints   Patient Profile    Levi [name] is a 67 y.o. male has hx of no past medical history, morbid obesity.  Admitted on 07/25/2023 due to shortness of breath, lower extremity edema, orthopnea.  At home was hypoxic with oxygen saturations 64% on room air.  On arrival required BiPAP however was later intubated.  Cardiology asked to see due to new onset biventricular heart failure Assessment & Plan .   Impression:  Acute biventricular heart failure. Acute heart failure with reduced EF Cardiomyopathy, unspecified and newly discovered New onset of atrial fibrillation with rapid ventricular rate -now  paroxysmal Hypercapnic/hypoxic respiratory failure-vent support Acute H.flu infection.  Acute kidney injury - improving Bilateral multifocal pneumonia with gram-negative rods Acute metabolic encephalopathy - more awake now  Diabetes mellitus Aortic atherosclerosis Morbid obesity-BMI 68  Recommendations:  Acute biventricular heart failure. Acute heart failure with reduced EF Cardiomyopathy, unspecified and newly discovered Remains intubated but started weaning trails today.  Currently not on pressor or inotropic support. Co-ox have remained  acceptable.  CVP - likely not accurate, making good UOP, negative balance, monitor clinically  Has diuresed Net IO Since Admission: -10,449.08 mL [07/31/23 1211]  High sensitive troponins peaked at 371 due to demand ischemia.  LDL calculated 87. BP are soft so will hold off GDMT for now. Will uptirate closer to d/c depending on renal function and BP.  Ischemic workup can be consider if he continues to improve clinically given his newly discovered  cardiomyopathy, prior to discharge (continue to follow) Need outpatient sleep apnea workup.   New onset of atrial fibrillation Likely secondary to hypercapnic/hypoxic respiratory failure, cardiomyopathy, and biventricular heart failure. Converted to normal sinus rhythm. Telemetry notes paroxysmal A-fib episodes. Currently on IV heparin and amiodarone drip. Early this week there was concern that amiodarone could be contributing to his encephalopathy and I recommended to hold if needed. He remains on it. Spoke to CCM they plan to keep the drip for now and will change to oral s/p extubation.  Click Here to Calculate/Change CHADS2VASc Score The patient's CHADS2-VASc score is 4, indicating a 4.8% annual risk of stroke.   CHF History: Yes HTN History: No Diabetes History: Yes Stroke History: No Vascular Disease History: Yes  AKI: Resolving  Serum creatinine improving. Monitor BUN and creatinine.  Bilateral multifocal pneumonia with gram-negative rods - H.Flu: ICU following.  Acute metabolic encephalopathy:  Multifactorial. Improving clinically   Further recommendations to follow as the case evolves. Plan of care d/w CCM attending.   This note was created using a voice recognition software as a result there may be grammatical errors inadvertently enclosed that do not reflect the nature of this encounter. Every attempt is made to correct such errors.   Tessa Lerner, DO, [facility]  [address] [place], [place]  [address] Pager: [phone] Office: [phone] 07/31/2023  12:11 PM

## 2023-07-31 NOTE — Progress Notes (Signed)
SLP Cancellation Note  Patient Details Name: Levi [name] MRN: [id number] DOB: 07-23-1956   Cancelled treatment:       Reason Eval/Treat Not Completed: Patient not medically ready. Pt extubated earlier this date, but currently lethargic and requiring Bi-PAP. SLP will continue to follow for readiness to participate.    Gwynneth Aliment, M.A., CF-SLP Speech Language Pathology, Acute Rehabilitation Services  Secure Chat preferred [phone]  07/31/2023, 4:38 PM

## 2023-07-31 NOTE — Plan of Care (Signed)
  Problem: Coping: Goal: Ability to adjust to condition or change in health will improve Outcome: Progressing   Problem: Fluid Volume: Goal: Ability to maintain a balanced intake and output will improve Outcome: Progressing   Problem: Metabolic: Goal: Ability to maintain appropriate glucose levels will improve Outcome: Progressing   Problem: Nutritional: Goal: Maintenance of adequate nutrition will improve Outcome: Progressing   Problem: Skin Integrity: Goal: Risk for impaired skin integrity will decrease Outcome: Progressing   Problem: Safety: Goal: Non-violent Restraint(s) Outcome: Progressing

## 2023-07-31 NOTE — Progress Notes (Signed)
PHARMACY - ANTICOAGULATION  Pharmacy Consult for heparin Indication: atrial fibrillation  No Known Allergies  Patient Measurements: Height: 5' 7.01" (170.2 cm) Weight: (!) 193 kg (425 lb 7.8 oz) IBW/kg (Calculated) : 66.12 Heparin Dosing Weight: 117 kg  Vital Signs: Temp: 98 F (36.7 C) (12/06 0400) Temp Source: Axillary (12/06 0400) BP: 132/75 (12/06 0400) Pulse Rate: 56 (12/06 0400)  Labs: Recent Labs    07/29/23 0433 07/29/23 0938 07/30/23 0511 07/31/23 0430  HGB 15.3  --  15.1 14.5  HCT 50.4  --  50.1 49.2  PLT 128*  --  134* 133*  HEPARINUNFRC  --  0.42 0.43 0.27*  CREATININE 2.33*  --  2.05* 1.42*    Estimated Creatinine Clearance: 82.3 mL/min (A) (by C-G formula based on SCr of 1.42 mg/dL (H)).  Assessment: 67 y.o. male with Afib. Pharmacy consulted for heparin infusion. No anticoagulants prior to admission  HL 0.27 - subtherapeutic, no issues running continuously per RN CBC stable, Plt 133.   Goal of Therapy:  Heparin level 0.3-0.7 units/ml Monitor platelets by anticoagulation protocol: Yes   Plan:  Increase heparin to 1850 units/hr 6h heparin level Daily HL, CBC F/u s/sx bleeding , anticoagulation plans longterm  Arabella Merles, PharmD. Clinical Pharmacist 07/31/2023 5:30 AM

## 2023-07-31 NOTE — Procedures (Signed)
Extubation Procedure Note  Patient Details:   Name: Levi [name] DOB: 1955-10-01 MRN: [id number]   Airway Documentation:    Vent end date: 07/31/23 Vent end time: 1523   Evaluation  O2 sats: stable throughout Complications: No apparent complications Patient did tolerate procedure well. Bilateral Breath Sounds: Clear, Diminished   Patient extubated per order to bipap with no complications. Positive cuff leak was noted prior to extubation. Patient is alert and oriented and has strong cough. Vitals are stable. RT will continue to monitor.   Shenia Alan Lajuana Ripple 07/31/2023, 3:40 PM

## 2023-07-31 NOTE — Progress Notes (Signed)
Weaning started at 1140 by Dr. Merrily Pew

## 2023-08-01 ENCOUNTER — Inpatient Hospital Stay (HOSPITAL_COMMUNITY): Payer: Medicare HMO

## 2023-08-01 DIAGNOSIS — I5040 Unspecified combined systolic (congestive) and diastolic (congestive) heart failure: Secondary | ICD-10-CM | POA: Diagnosis not present

## 2023-08-01 DIAGNOSIS — I5041 Acute combined systolic (congestive) and diastolic (congestive) heart failure: Secondary | ICD-10-CM | POA: Diagnosis not present

## 2023-08-01 DIAGNOSIS — J9601 Acute respiratory failure with hypoxia: Secondary | ICD-10-CM | POA: Diagnosis not present

## 2023-08-01 DIAGNOSIS — J9602 Acute respiratory failure with hypercapnia: Secondary | ICD-10-CM | POA: Diagnosis not present

## 2023-08-01 DIAGNOSIS — I4891 Unspecified atrial fibrillation: Secondary | ICD-10-CM | POA: Diagnosis not present

## 2023-08-01 DIAGNOSIS — N179 Acute kidney failure, unspecified: Secondary | ICD-10-CM | POA: Diagnosis not present

## 2023-08-01 LAB — POCT I-STAT 7, (LYTES, BLD GAS, ICA,H+H)
Acid-Base Excess: 4 mmol/L — ABNORMAL HIGH (ref 0.0–2.0)
Acid-Base Excess: 3 mmol/L — ABNORMAL HIGH (ref 0.0–2.0)
Bicarbonate: 33.9 mmol/L — ABNORMAL HIGH (ref 20.0–28.0)
Bicarbonate: 33.5 mmol/L — ABNORMAL HIGH (ref 20.0–28.0)
Calcium, Ion: 1.21 mmol/L (ref 1.15–1.40)
Calcium, Ion: 1.19 mmol/L (ref 1.15–1.40)
HCT: 46 % (ref 39.0–52.0)
HCT: 47 % (ref 39.0–52.0)
Hemoglobin: 15.6 g/dL (ref 13.0–17.0)
Hemoglobin: 16 g/dL (ref 13.0–17.0)
O2 Saturation: 90 %
O2 Saturation: 99 %
Patient temperature: 99.5
Patient temperature: 100.4
Potassium: 4.2 mmol/L (ref 3.5–5.1)
Potassium: 4.1 mmol/L (ref 3.5–5.1)
Sodium: 153 mmol/L — ABNORMAL HIGH (ref 135–145)
Sodium: 153 mmol/L — ABNORMAL HIGH (ref 135–145)
TCO2: 36 mmol/L — ABNORMAL HIGH (ref 22–32)
TCO2: 36 mmol/L — ABNORMAL HIGH (ref 22–32)
pCO2 arterial: 75.2 mm[Hg] (ref 32–48)
pCO2 arterial: 82.4 mm[Hg] (ref 32–48)
pH, Arterial: 7.265 — ABNORMAL LOW (ref 7.35–7.45)
pH, Arterial: 7.223 — ABNORMAL LOW (ref 7.35–7.45)
pO2, Arterial: 72 mm[Hg] — ABNORMAL LOW (ref 83–108)
pO2, Arterial: 150 mm[Hg] — ABNORMAL HIGH (ref 83–108)

## 2023-08-01 LAB — GLUCOSE, CAPILLARY
Glucose-Capillary: 110 mg/dL — ABNORMAL HIGH (ref 70–99)
Glucose-Capillary: 113 mg/dL — ABNORMAL HIGH (ref 70–99)
Glucose-Capillary: 121 mg/dL — ABNORMAL HIGH (ref 70–99)
Glucose-Capillary: 75 mg/dL (ref 70–99)
Glucose-Capillary: 79 mg/dL (ref 70–99)
Glucose-Capillary: 88 mg/dL (ref 70–99)
Glucose-Capillary: 90 mg/dL (ref 70–99)

## 2023-08-01 LAB — BASIC METABOLIC PANEL
Anion gap: 13 (ref 5–15)
BUN: 57 mg/dL — ABNORMAL HIGH (ref 8–23)
CO2: 29 mmol/L (ref 22–32)
Calcium: 9.1 mg/dL (ref 8.9–10.3)
Chloride: 111 mmol/L (ref 98–111)
Creatinine, Ser: 1.58 mg/dL — ABNORMAL HIGH (ref 0.61–1.24)
GFR, Estimated: 47 mL/min — ABNORMAL LOW (ref >60)
Glucose, Bld: 98 mg/dL (ref 70–99)
Potassium: 3.9 mmol/L (ref 3.5–5.1)
Sodium: 153 mmol/L — ABNORMAL HIGH (ref 135–145)

## 2023-08-01 LAB — CBC
HCT: 52.2 % — ABNORMAL HIGH (ref 39.0–52.0)
Hemoglobin: 15.2 g/dL (ref 13.0–17.0)
MCH: 25.3 pg — ABNORMAL LOW (ref 26.0–34.0)
MCHC: 29.1 g/dL — ABNORMAL LOW (ref 30.0–36.0)
MCV: 86.9 fL (ref 80.0–100.0)
Platelets: 160 10*3/uL (ref 150–400)
RBC: 6.01 MIL/uL — ABNORMAL HIGH (ref 4.22–5.81)
RDW: 19.3 % — ABNORMAL HIGH (ref 11.5–15.5)
WBC: 12 10*3/uL — ABNORMAL HIGH (ref 4.0–10.5)
nRBC: 0 % (ref 0.0–0.2)

## 2023-08-01 LAB — HEPARIN LEVEL (UNFRACTIONATED): Heparin Unfractionated: 0.4 [IU]/mL (ref 0.30–0.70)

## 2023-08-01 LAB — BRAIN NATRIURETIC PEPTIDE: B Natriuretic Peptide: 455.6 pg/mL — ABNORMAL HIGH (ref 0.0–100.0)

## 2023-08-01 MED ORDER — ETOMIDATE 2 MG/ML IV SOLN
20.0000 mg | Freq: Once | INTRAVENOUS | Status: AC
  Administered 2023-08-01: 20 mg via INTRAVENOUS

## 2023-08-01 MED ORDER — ETOMIDATE 2 MG/ML IV SOLN
INTRAVENOUS | Status: AC
  Filled 2023-08-01: qty 20

## 2023-08-01 MED ORDER — ROCURONIUM BROMIDE 10 MG/ML (PF) SYRINGE
100.0000 mg | PREFILLED_SYRINGE | Freq: Once | INTRAVENOUS | Status: AC
  Administered 2023-08-01: 100 mg via INTRAVENOUS

## 2023-08-01 MED ORDER — MIDAZOLAM HCL 2 MG/2ML IJ SOLN
INTRAMUSCULAR | Status: AC
  Filled 2023-08-01: qty 2

## 2023-08-01 MED ORDER — MIDAZOLAM HCL 2 MG/2ML IJ SOLN
1.0000 mg | INTRAMUSCULAR | Status: DC | PRN
Start: 2023-08-01 — End: 2023-08-08
  Administered 2023-08-01 – 2023-08-04 (×5): 2 mg via INTRAVENOUS
  Filled 2023-08-01 (×5): qty 2

## 2023-08-01 MED ORDER — FENTANYL CITRATE PF 50 MCG/ML IJ SOSY
100.0000 ug | PREFILLED_SYRINGE | Freq: Once | INTRAMUSCULAR | Status: AC
  Administered 2023-08-01: 100 ug via INTRAVENOUS

## 2023-08-01 MED ORDER — METOCLOPRAMIDE HCL 5 MG/ML IJ SOLN
5.0000 mg | Freq: Four times a day (QID) | INTRAMUSCULAR | Status: AC
  Administered 2023-08-01 – 2023-08-02 (×6): 5 mg via INTRAVENOUS
  Filled 2023-08-01 (×6): qty 2

## 2023-08-01 MED ORDER — FENTANYL CITRATE PF 50 MCG/ML IJ SOSY: 25.0000 ug | PREFILLED_SYRINGE | Freq: Once | INTRAMUSCULAR | Status: AC

## 2023-08-01 MED ORDER — INSULIN GLARGINE-YFGN 100 UNIT/ML ~~LOC~~ SOLN
8.0000 [IU] | Freq: Every day | SUBCUTANEOUS | Status: DC
  Administered 2023-08-02 – 2023-08-08 (×7): 8 [IU] via SUBCUTANEOUS
  Filled 2023-08-01 (×8): qty 0.08

## 2023-08-01 MED ORDER — DEXTROSE 5 % IV SOLN: INTRAVENOUS | Status: AC

## 2023-08-01 MED ORDER — DOCUSATE SODIUM 50 MG/5ML PO LIQD
100.0000 mg | Freq: Two times a day (BID) | ORAL | Status: DC
  Administered 2023-08-02: 100 mg
  Filled 2023-08-01 (×2): qty 10

## 2023-08-01 MED ORDER — METOPROLOL TARTRATE 5 MG/5ML IV SOLN: 2.5000 mg | INTRAVENOUS | Status: DC | PRN

## 2023-08-01 MED ORDER — FENTANYL BOLUS VIA INFUSION
25.0000 ug | INTRAVENOUS | Status: DC | PRN
  Administered 2023-08-01 (×2): 100 ug via INTRAVENOUS
  Administered 2023-08-01 (×2): 75 ug via INTRAVENOUS
  Administered 2023-08-01 (×2): 100 ug via INTRAVENOUS
  Administered 2023-08-01: 75 ug via INTRAVENOUS
  Administered 2023-08-01 – 2023-08-02 (×6): 100 ug via INTRAVENOUS
  Administered 2023-08-02: 75 ug via INTRAVENOUS
  Administered 2023-08-03 (×4): 100 ug via INTRAVENOUS
  Administered 2023-08-03 – 2023-08-04 (×2): 50 ug via INTRAVENOUS
  Administered 2023-08-04 (×3): 75 ug via INTRAVENOUS

## 2023-08-01 MED ORDER — ROCURONIUM BROMIDE 10 MG/ML (PF) SYRINGE
PREFILLED_SYRINGE | INTRAVENOUS | Status: AC
  Filled 2023-08-01: qty 10

## 2023-08-01 MED ORDER — ACETAMINOPHEN 325 MG PO TABS
650.0000 mg | ORAL_TABLET | Freq: Four times a day (QID) | ORAL | Status: DC | PRN
  Administered 2023-08-02: 650 mg via ORAL
  Filled 2023-08-01: qty 2

## 2023-08-01 MED ORDER — FENTANYL CITRATE PF 50 MCG/ML IJ SOSY
PREFILLED_SYRINGE | INTRAMUSCULAR | Status: AC
  Filled 2023-08-01: qty 2

## 2023-08-01 MED ORDER — ACETAMINOPHEN 10 MG/ML IV SOLN
1000.0000 mg | Freq: Once | INTRAVENOUS | Status: AC
  Administered 2023-08-01: 1000 mg via INTRAVENOUS
  Filled 2023-08-01: qty 100

## 2023-08-01 MED ORDER — MIDAZOLAM HCL 2 MG/2ML IJ SOLN
2.0000 mg | Freq: Once | INTRAMUSCULAR | Status: AC
  Administered 2023-08-01: 2 mg via INTRAVENOUS

## 2023-08-01 MED ORDER — ORAL CARE MOUTH RINSE
15.0000 mL | OROMUCOSAL | Status: DC
  Administered 2023-08-01 – 2023-08-03 (×17): 15 mL via OROMUCOSAL

## 2023-08-01 MED ORDER — ACETAMINOPHEN 650 MG RE SUPP
650.0000 mg | Freq: Four times a day (QID) | RECTAL | Status: DC | PRN
  Filled 2023-08-01: qty 1

## 2023-08-01 MED ORDER — FENTANYL 2500MCG IN NS 250ML (10MCG/ML) PREMIX INFUSION
25.0000 ug/h | INTRAVENOUS | Status: DC
  Administered 2023-08-01: 25 ug/h via INTRAVENOUS
  Administered 2023-08-02 (×2): 175 ug/h via INTRAVENOUS
  Administered 2023-08-03: 200 ug/h via INTRAVENOUS
  Filled 2023-08-01 (×4): qty 250

## 2023-08-01 NOTE — Progress Notes (Signed)
eLink Physician-Brief Progress Note Patient Name: ALLEC CLAYDON DOB: [birth year] MRN: [id number]   Date of Service  08/01/2023  HPI/Events of Note  Pt had been reintubated today.  Sedated, but intermittently reaching for ETT.    eICU Interventions  Placed order for R wrist restraint.  Will continue to monitor closely.         Jaylynn Mcaleer M DELA CRUZ 08/01/2023, 8:16 PM  12:25 AM Changed restraint orders - bilateral soft wrist restraints ordered

## 2023-08-01 NOTE — Progress Notes (Signed)
RT note. Patient placed on 5 L  at this time sat 94% with no labored breathing noted at this time. Patient given flutter valve due to rhonchi BBS. Bipap on stand by. RT will continue to monitor.   08/01/23 0725  Therapy Vitals  Pulse Rate (!) 108  Resp (!) 23  MEWS Score/Color  MEWS Score 4  MEWS Score Color Red  Respiratory Assessment  Assessment Type Post-treatment  Respiratory Pattern Regular;Unlabored  Chest Assessment Chest expansion symmetrical  Cough Non-productive;Congested;Weak  Bilateral Breath Sounds Rhonchi;Diminished  Oxygen Therapy/Pulse Ox  O2 Device Nasal Cannula  O2 Therapy Oxygen  O2 Flow Rate (L/min) 5 L/min  SpO2 94 %

## 2023-08-01 NOTE — Progress Notes (Signed)
Intermittent vomiting since morning. After another episode, he became hypoxic, vomitus suctioned out from mouth and oral cavity. ABG showed hypercarbia, unable to use BiPAP. Discussed with patient and wife, proceed with reintubation for airway protection He was reintubated, OG tube placed and connected to low intermittent suction. Chest x-ray shows ET tube in position, x-ray suggests ileus pattern Okay to use OG tube for meds Will use fentanyl for sedation with Versed pushes, goal RASS 0 to -1  My additional critical care time independent of procedures was 25 minutes  Maxon Kresse V. Vassie Loll MD

## 2023-08-01 NOTE — Progress Notes (Signed)
[name] Date of Encounter: 08/01/2023 [facility] Cardiologist: None   Interval Summary  .    Extubated yesterday - noted to be in a sinus tachycardia today - had vomited. Low grade temp at 100.5.  Net IO Since Admission: -10,932.95 mL [08/01/23 1030]  Vital Signs .    Vitals:   08/01/23 0600 08/01/23 0700 08/01/23 0725 08/01/23 0800  BP: 133/73 123/69    Pulse: (!) 112 (!) 111 (!) 108   Resp: (!) 21 (!) 21 (!) 23   Temp:    99.3 F (37.4 C)  TempSrc:    Oral  SpO2: 94% 91% 94%   Weight:      Height:        Intake/Output Summary (Last 24 hours) at 08/01/2023 1030 Last data filed at 08/01/2023 0700 Gross per 24 hour  Intake 1070.58 ml  Output 1550 ml  Net -479.42 ml      08/01/2023    5:00 AM 07/31/2023    5:00 AM 07/30/2023    5:00 AM  Last 3 Weights  Weight (lbs) 419 lb 1.5 oz 423 lb 1 oz 425 lb 7.8 oz  Weight (kg) 190.1 kg 191.9 kg 193 kg      Telemetry/ECG    Sinus tachycardia- Personally Reviewed  CV Studies    Echocardiogram 07/25/2023 1. Left ventricular ejection fraction, by estimation, is <20%. The left  ventricle has severely decreased function. The left ventricle demonstrates  global hypokinesis. The left ventricular internal cavity size was mildly  dilated. There is mild concentric   left ventricular hypertrophy. Left ventricular diastolic parameters are  consistent with Grade I diastolic dysfunction (impaired relaxation).   2. Right ventricular systolic function is severely reduced. The right  ventricular size is severely enlarged. There is normal pulmonary artery  systolic pressure. The estimated right ventricular systolic pressure is  29.4 mmHg.   3. The mitral valve is grossly normal. No evidence of mitral valve  regurgitation. No evidence of mitral stenosis.   4. The aortic valve is tricuspid. Aortic valve regurgitation is trivial.  No aortic stenosis is present.   5. There is mild dilatation of the ascending  aorta, measuring 41 mm.   6. The inferior vena cava is dilated in size with <50% respiratory  variability, suggesting right atrial pressure of 15 mmHg.   Comparison(s): No prior Echocardiogram.   Physical Exam .    General appearance: alert, no distress, morbidly obese, and plesant Neck: supple, symmetrical, trachea midline and neck thick Lungs: diminished breath sounds bilaterally and wheezes LLL Heart: regular tachycardia Abdomen: morbidly obese Extremities: edema 2+ and venous stasis dermatitis noted Pulses: 2+ and symmetric Skin: Skin color, texture, turgor normal. No rashes or lesions Neurologic: Grossly normal Psych: Pleasant   Patient Profile    Levi [name] is a 67 y.o. male has hx of no past medical history, morbid obesity.  Admitted on 07/25/2023 due to shortness of breath, lower extremity edema, orthopnea.  At home was hypoxic with oxygen saturations 64% on room air.  On arrival required BiPAP however was later intubated.  Cardiology asked to see due to new onset biventricular heart failure  Assessment & Plan .    Acute biventricular heart failure. Acute heart failure with reduced EF Cardiomyopathy, unspecified and newly discovered New onset of atrial fibrillation with rapid ventricular rate -now paroxysmal Hypercapnic/hypoxic respiratory failure-vent support Acute H.flu infection.  Acute kidney injury - improving Bilateral multifocal pneumonia with gram-negative rods Acute metabolic  encephalopathy - more awake now  Diabetes mellitus Aortic atherosclerosis Morbid obesity-BMI 68   Acute biventricular heart failure. Acute heart failure with reduced EF Cardiomyopathy, unspecified and newly discovered Extubated Currently not on pressor or inotropic support. Co-ox have remained acceptable.  Has diuresed Net IO Since Admission: -10,932.95 mL [08/01/23 1030]  High sensitive troponins peaked at 371 due to demand ischemia.  LDL calculated 87. BP has improved - HR  elevated today Would likely need L/RHC in the future, however, not stable for this at present. Tachycardia may be due to volume depletion noting rising sodium and creatinine, despite elevated BNP - would not diurese further Need outpatient sleep apnea workup.   New onset of atrial fibrillation Likely secondary to hypercapnic/hypoxic respiratory failure, cardiomyopathy, and biventricular heart failure. Converted to normal sinus rhythm -now sinus tach today, may be due to volume depletion - would not add beta blocker given sinus tachy - ok to continue amiodarone, however, he is currently NPO - plan for swallow evaluation, if he fails, may need to convert to IV amiodraone Telemetry notes paroxysmal A-fib episodes. Currently on IV heparin and amiodarone drip. Early this week there was concern that amiodarone could be contributing to his encephalopathy and I recommended to hold if needed. He remains on it. Spoke to CCM they plan to keep the drip for now and will change to oral s/p extubation.  Click Here to Calculate/Change CHADS2VASc Score The patient's CHADS2-VASc score is 4, indicating a 4.8% annual risk of stroke.   CHF History: Yes HTN History: No Diabetes History: Yes Stroke History: No Vascular Disease History: Yes  AKI: Creatinine worse today. Hold off on further diuretics. Sodium is climbing - may be free water deficit.  Bilateral multifocal pneumonia with gram-negative rods - H.Flu: ICU following. Low grade temp today. WBC rising - now 12K today, from 7K.  Acute metabolic encephalopathy:  Multifactorial.   [name], MD, [facility], FACP  [place]  [facility]  Medical Director of the [facility]  [facility] Reduction [facility] Diplomate of the American Board of Clinical Lipidology Attending Cardiologist  Direct Dial: [phone]  Fax: [phone]  Website:  [email or web address]  08/01/2023  10:30 AM

## 2023-08-01 NOTE — Evaluation (Signed)
Clinical/Bedside Swallow Evaluation Patient Details  Name: Levi [name] MRN: [id number] Date of Birth: 12-16-1955  Today's Date: 08/01/2023 Time: SLP Start Time (ACUTE ONLY): 1335 SLP Stop Time (ACUTE ONLY): 1350 SLP Time Calculation (min) (ACUTE ONLY): 15 min  Past Medical History: History reviewed. No pertinent past medical history. Past Surgical History: History reviewed. No pertinent surgical history. HPI:  Levi [name] is a 67 y.o. male with morbid obesity.  Admitted on 07/25/2023 due to shortness of breath, lower extremity edema, orthopnea, acute Hypoxic and Hypercapnic respiratory failure -secondary to pulmonary edema and complicated further by BL positive Haemophilus influenza PNA (12/3), Presumed OSA/OHS.At home was hypoxic with oxygen saturations 64% on room air.  On arrival required BiPAP however was later intubated.  Extubated 12/6.    Assessment / Plan / Recommendation  Clinical Impression  Patient presents with evidence of an acute reversible dysphagia s/p prolonged intubation. Vocal quality largely aphonic, indicative of decreased glottal closure,  with occassional brief periods of slightly audible hoarse vocalizations noted. Cough weak and congested. Immediate cough response noted following intake of thin liquids via small cup sip, likely not strong enough to expel what is suspected to be aspiration. Recommend instrumental testing to evaluate swallowing physiology and determine potential to safely resume a po diet. Will plan for FEES next date. SLP Visit Diagnosis: Dysphagia, pharyngeal phase (R13.13)       Diet Recommendation NPO    Medication Administration: Via alternative means    Other  Recommendations Oral Care Recommendations: Oral care QID    Recommendations for follow up therapy are one component of a multi-disciplinary discharge planning process, led by the attending physician.  Recommendations may be updated based on patient status, additional functional criteria  and insurance authorization.  Follow up Recommendations  (TBD)      Assistance Recommended at Discharge    Functional Status Assessment Patient has had a recent decline in their functional status and demonstrates the ability to make significant improvements in function in a reasonable and predictable amount of time.  Frequency and Duration            Prognosis Prognosis for improved oropharyngeal function: Good      Swallow Study   General HPI: Levi [name] is a 67 y.o. male with morbid obesity.  Admitted on 07/25/2023 due to shortness of breath, lower extremity edema, orthopnea, acute Hypoxic and Hypercapnic respiratory failure -secondary to pulmonary edema and complicated further by BL positive Haemophilus influenza PNA (12/3), Presumed OSA/OHS.At home was hypoxic with oxygen saturations 64% on room air.  On arrival required BiPAP however was later intubated.  Extubated 12/6. Type of Study: Bedside Swallow Evaluation Previous Swallow Assessment: none Diet Prior to this Study: NPO Temperature Spikes Noted: No Respiratory Status: Nasal cannula History of Recent Intubation: Yes Total duration of intubation (days): 7 days Date extubated: 07/31/23 Behavior/Cognition: Alert;Cooperative;Pleasant mood Oral Cavity Assessment: Within Functional Limits Oral Care Completed by SLP: Recent completion by staff Oral Cavity - Dentition: Dentures, top;Missing dentition (missing bottom) Vision: Functional for self-feeding Self-Feeding Abilities: Needs assist Patient Positioning: Upright in bed;Postural control interferes with function (due to body habitus) Baseline Vocal Quality: Aphonic Volitional Cough: Weak;Congested Volitional Swallow: Able to elicit    Oral/Motor/Sensory Function Overall Oral Motor/Sensory Function: Within functional limits   Ice Chips Ice chips: Within functional limits Presentation: Spoon   Thin Liquid Thin Liquid: Impaired Presentation: Cup Pharyngeal  Phase  Impairments: Cough - Immediate    Nectar Thick Nectar Thick Liquid: Not tested  Honey Thick Honey Thick Liquid: Not tested   Puree Puree: Impaired Presentation: Spoon Pharyngeal Phase Impairments: Multiple swallows   Solid     Solid: Not tested     Kailin Leu MA, CCC-SLP  Cas Tracz Meryl 08/01/2023,1:58 PM

## 2023-08-01 NOTE — Progress Notes (Signed)
Pt had an episode of emesis while on bipap. Mask was removed. Patient was suctioned then placed on 6l [place]. Pt able to cough and clear secretions No signs of aspiration. Currently satting 95. Updated elink nurse and  Dr. Delia Chimes to be notified.   Octavia Bruckner RN

## 2023-08-01 NOTE — Progress Notes (Signed)
NAME:  Levi [name], MRN:  [id number], DOB:  28-Jun-1956, LOS: 8 ADMISSION DATE:  07/24/2023, CONSULTATION DATE: 07/24/2023  REFERRING MD: Marlene Bast, CHIEF COMPLAINT: Shortness of breath and hypoxia  History of Present Illness:  67 year old man with morbid obesity, probably untreated OSA/OHS and knee OA who presented to ED with dry cough, DOE, SOB, LL edema, orthopnea for 1-2 weeks. Home health RN found his SpO2 64% on RA. Given a dose of Albuterol. Denied wheezing, CP, N/V/D, abd pain, f/c/r, and rash. Not on home O2 or NIV.  No smoking, illicit drug use, or alcoholism.  No hx of COPD, asthma, VTE, cardiac hx, liver disease, or CKD Developed runs of VT in ED. No EKG changes initially.   Pertinent Medical History:  History reviewed. No pertinent past medical history.  Significant Hospital Events: Including procedures, antibiotic start and stop dates in addition to other pertinent events   11/29 Admitted to [facility] 12/3 Trach aspirate sent for thick secretions. Amiodarone started for AF w/ bigeminy and trigeminy. New onset rhythmic twitching left face, LLE. Resolved spontaneously. 12/4 Seen by Neuro: myoclonic jerks noted on Cer[employer] monitor but not able to distinguish if seizure activity. Transferred to [facility] for LTM. Started Keppra.  Febrile. Sats 88%. LEUS negative for DVT. LTM neg for seizure so Keppra stopped later that day.  Had been started on vanc and zosyn 12/3 for fever CT was neg for stroke. Vanc stopped 12/5 sputum H flu BL positive. Change to CTX. More awake. Trying to wean sedation to assess weaning ability. Renal fxn improved after holding Lasix. 12/6 extubated  Interim History / Subjective:  Off Precedex, extubated yesterday. Bout of emesis overnight and this morning Afebrile   Objective:  Blood pressure 123/69, pulse (!) 108, temperature 99.3 F (37.4 C), temperature source Oral, resp. rate (!) 23, height 5' 7.01" (1.702 m), weight (!) 190.1 kg, SpO2 94%. CVP:  [8  mmHg-23 mmHg] 8 mmHg  Vent Mode: PCV;BIPAP FiO2 (%):  [40 %] 40 % Set Rate:  [12 bmp] 12 bmp PEEP:  [8 cmH20] 8 cmH20 Pressure Support:  [8 cmH20] 8 cmH20   Intake/Output Summary (Last 24 hours) at 08/01/2023 5621 Last data filed at 08/01/2023 0700 Gross per 24 hour  Intake 1322.55 ml  Output 1795 ml  Net -472.45 ml   Filed Weights   07/30/23 0500 07/31/23 0500 08/01/23 0500  Weight: (!) 193 kg (!) 191.9 kg (!) 190.1 kg   Physical Examination: General: Acute-on-chronically ill-appearing morbidly obese middle-aged man in NAD.  HEENT: [place]/AT, anicteric sclera, PERRL, moist mucous membranes. Neuro: Awake, moves all 4 extremities, intermittently follows commands generalized weakness. +Cough and +Gag  CV: Tacky, irregular PULM: Decreased breath sounds bilateral, no accessory muscle use GI: Obese, soft, nontender, nondistended. Hypoactive bowel sounds. Extremities: Bilateral symmetric 1+ chronic LE edema noted. Skin: Warm/dry, no rashes.  Labs show hyponatremia 153, BUN/creatinine 57/1.6, increase leukocytosis  Assessment & Plan:   Acute-on-chronic systolic heart failure Echo from 11/30 with EF <20%, G1DD, severely reduced RVSF, severely enlarged RV - Cardiology following, appreciate assistance; considering R/LHC when more stable - GDMT once able to take p.o. -Last co-oxwas 74% - Cardiac monitoring/tele   Acute Hypoxic and Hypercapnic respiratory failure -secondary to pulmonary edema and complicated further by BL positive Haemophilus influenza PNA (12/3) Presumed OSA/OHS PCT trending down. Tmax 101.8. 102.58F. - Antibiotics x 5-day course (end 12/7), initially Zosyn x 3 days transitioned to ceftriaxone -Now on nasal cannula. Needs nocturnal BiPAP, but holding off currently due to  episodes of vomiting   Vomiting -receiving Zofran, and Reglan for ileus. Holding off BiPAP for now due to risk for aspiration  New onset AF w/ intermittent ventricular ectopy, CHA2DS2/VASC score 2.   Bigeminy/trigeminy VT Started on amio and heparin 12/3.  - Cardiac monitoring - Optimize electrolytes for K > 4, Mg > 2   Possible seizure. Non witnessed by LTM. Felt metabolic in nature. CT head obtained and it was reassuring w/out new clear stroke/injury - Monitor off of AEDs - Keppra discontinued 12/4  Acute metabolic encephalopathy  Improved c/w yesterday.  Myoclonic jerks attributed to uremia and sedation -Try to minimize sedation  Acute kidney injury w/ rising Cr in context of on-going diuresis  Renal fxn improved w/ holding lasix. - Trend BMP, Cr improving - Replete electrolytes as indicated - Monitor I&Os, goal euvolemia - Avoid nephrotoxic agents as able - Ensure adequate renal perfusion  Congestive hepatopathy, improving - Supportive care - Intermittent LFT monitoring  Uncontrolled diabetes, now hypoglycemic while n.p.o. - SSI - CBGs Q4H - Goal CBG 140-180 -Decrease Semglee to 8 units hold for today, start D5W which would also help with hypernatremia  Morbid Obesity - Counseling re: weight loss/lifestyle changes  Best Practice: (right click and "Reselect all SmartList Selections" daily)   Diet/type: tubefeeds DVT prophylaxis: IV heparin started 12/3   Pressure ulcer(s): not present on admission  GI prophylaxis: H2B Lines: Central line Foley:  Yes, and it is still needed Code Status:  full code Last date of multidisciplinary goals of care discussion [discussed with patient's spouse and brother at bedside 12/1]  Critical care time: 39m   The patient is critically ill with multiple organ system failure and requires high complexity decision making for assessment and support, frequent evaluation and titration of therapies, advanced monitoring, review of radiographic studies and interpretation of complex data.   Critical Care Time devoted to patient care services, exclusive of separately billable procedures, described in this note is 38 minutes.  Comer Locket  Vassie Loll, MD [facility] 08/01/23 8:23 AM  Please see [email or web address] for pager details.  From 7A-7P if no response, please call [phone] After hours, please call ELink [phone]\

## 2023-08-01 NOTE — Progress Notes (Signed)
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  Tolerated extubation.  Secretions are improving.  Was placed on BiPAP for support postextubation  Saturating well on nasal cannula after the patient had a bout of emesis while on BiPAP.  More alert.  On examination moving all extremities spontaneously in no apparent distress  eICU Interventions  Okay to maintain nasal cannula for now.  If evidence of worsening tachycardia or hypoxemia would favor using heated humidified high flow for respiratory support rather than returning to BiPAP.  Zofran is available  Discontinue vent related sedation orders.     Intervention Category Intermediate Interventions: Respiratory distress - evaluation and management  Willella Harding 08/01/2023, 2:21 AM

## 2023-08-01 NOTE — Evaluation (Signed)
Physical Therapy Evaluation Patient Details Name: Levi [name] MRN: [id number] DOB: [birth year] Today's Date: 08/01/2023  History of Present Illness  Pt is a 67 y.o. male who presented 07/24/23 with dry cough, SOB, lower extremity edema, orthopnea for 1-2 weeks. At home was hypoxic with oxygen saturations 64% on room air. Admitted with acute biventricular heart failure, new onset of afib, AKI, bil multifocal PNA, and acute metabolic encephalopathy. ETT 11/30 - 12/6. During admission, noted to have myoclonus likely due to uremia, amiodarone. PMH: morbid obesity, probably untreated OSA/OHS, and knee OA   Clinical Impression  Pt presents with condition above and deficits mentioned below, see PT Problem List. Pt is currently hallucinating, talking to people that were not present, and stating he can see the sky when looking up and he is currently at the flag pole with a lot of people. RN notified. Thus, pt may not be a reliable historian for PLOF/home set-up, but pt reports PTA he was independent without DME, living with his wife in a 1-level house with 3 STE. This info will need to be confirmed later. Currently, pt is demonstrating deficits in gross strength, power, aerobic endurance, activity tolerance, and cognition. He required maxA to transition supine to sit EOB then maxAx2 to return to supine after sitting EOB ~5 minutes. He required mod-maxA to prevent posterior LOB while sitting statically at EOB. He is at high risk for falls and has had a functional decline, and therefore could benefit from intensive inpatient rehab, > 3 hours/day. Will continue to follow acutely.        If plan is discharge home, recommend the following: Two people to help with walking and/or transfers;Two people to help with bathing/dressing/bathroom;Assistance with cooking/housework;Direct supervision/assist for medications management;Direct supervision/assist for financial management;Assist for transportation;Help with stairs or  ramp for entrance   Can travel by private vehicle        Equipment Recommendations BSC/3in1;Wheelchair (measurements PT);Wheelchair cushion (measurements PT);Hospital bed;Hoyer lift;Rolling walker (2 wheels) (bariatric - pending progress)  Recommendations for Other Services  Rehab consult    Functional Status Assessment Patient has had a recent decline in their functional status and demonstrates the ability to make significant improvements in function in a reasonable and predictable amount of time.     Precautions / Restrictions Precautions Precautions: Fall;Other (comment) Precaution Comments: watch SpO2; flexiseal Restrictions Weight Bearing Restrictions: No      Mobility  Bed Mobility Overal bed mobility: Needs Assistance Bed Mobility: Supine to Sit, Sit to Supine     Supine to sit: Max assist, HOB elevated Sit to supine: Max assist, +2 for physical assistance, +2 for safety/equipment, HOB elevated   General bed mobility comments: Pt cued to bring legs to L EOB  but pt then began to slide them to the R repeatedly. Pt needed guidance and multi-modal cues to bring them to the L and maxA to ascend trunk to sit L EOB    Transfers                   General transfer comment: deferred due to pt needing heavy assist for sitting balance and pt confused, felt it was unsafe to attempt without +3 assist at this time    Ambulation/Gait               General Gait Details: deferred due to pt needing heavy assist for sitting balance and pt confused, felt it was unsafe to attempt without +3 assist at this time  Stairs  Wheelchair Mobility     Tilt Bed    Modified Rankin (Stroke Patients Only) Modified Rankin (Stroke Patients Only) Pre-Morbid Rankin Score: No symptoms Modified Rankin: Severe disability     Balance Overall balance assessment: Needs assistance Sitting-balance support: Bilateral upper extremity supported, Feet supported Sitting  balance-Leahy Scale: Poor Sitting balance - Comments: Pt with posterior lean, needing mod-heavy maxA for static sitting balance ~5 min duration Postural control: Posterior lean     Standing balance comment: deferred due to pt needing heavy assist for sitting balance and pt confused, felt it was unsafe to attempt without +3 assist at this time                             Pertinent Vitals/Pain Pain Assessment Pain Assessment: Faces Faces Pain Scale: Hurts even more Pain Location: legs with mobility Pain Descriptors / Indicators: Discomfort, Grimacing, Guarding Pain Intervention(s): Limited activity within patient's tolerance, Monitored during session, Repositioned    Home Living Family/patient expects to be discharged to:: Private residence Living Arrangements: Spouse/significant other Available Help at Discharge: Family;Available 24 hours/day Type of Home: House Home Access: Stairs to enter   [employer] of Steps: 3   Home Layout: One level Home Equipment: None Additional Comments: pt confused and may be a poor historian so unsure of accuracy of info above    Prior Function Prior Level of Function : Independent/Modified Independent;Patient poor historian/Family not available             Mobility Comments: No AD per pt       Extremity/Trunk Assessment   Upper Extremity Assessment Upper Extremity Assessment: Defer to OT evaluation    Lower Extremity Assessment Lower Extremity Assessment: Generalized weakness    Cervical / Trunk Assessment Cervical / Trunk Assessment: Other exceptions Cervical / Trunk Exceptions: increased body habitus  Communication   Communication Communication: Other (comment) (soft spoken) Cueing Techniques: Verbal cues;Tactile cues  Cognition Arousal: Alert Behavior During Therapy: WFL for tasks assessed/performed Overall Cognitive Status: Impaired/Different from baseline Area of Impairment: Orientation, Attention,  Memory, Safety/judgement, Following commands, Awareness, Problem solving                 Orientation Level: Disoriented to, Place, Situation (did not ask time) Current Attention Level: Sustained Memory: Decreased short-term memory Following Commands: Follows one step commands inconsistently, Follows one step commands with increased time Safety/Judgement: Decreased awareness of safety, Decreased awareness of deficits Awareness: Intellectual Problem Solving: Slow processing, Decreased initiation, Difficulty sequencing, Requires tactile cues, Requires verbal cues General Comments: Pt stated he was at either "[facility]" or "home health" but then later stated he was outside looking at the sky by the flag pole. Pt unable to identify situation. Slow to process cues and often moving in opposite direction than cued. Pt talking but not to therapist, reported talking to "patients" and seeing a lot of people in the room when there was just the pt and PT, notified RN that pt is hallucinating.        General Comments General comments (skin integrity, edema, etc.): SpO2 >/= 89% on 5L O2    Exercises     Assessment/Plan    PT Assessment Patient needs continued PT services  PT Problem List Decreased strength;Decreased balance;Decreased activity tolerance;Decreased mobility;Decreased cognition;Decreased coordination;Decreased safety awareness;Decreased knowledge of use of DME;Cardiopulmonary status limiting activity;Obesity       PT Treatment Interventions DME instruction;Gait training;Stair training;Therapeutic activities;Functional mobility training;Therapeutic exercise;Balance training;Neuromuscular re-education;Cognitive remediation;Patient/family  education;Wheelchair mobility training    PT Goals (Current goals can be found in the Care Plan section)  Acute Rehab PT Goals Patient Stated Goal: to improve PT Goal Formulation: With patient Time For Goal Achievement: 08/15/23 Potential to  Achieve Goals: Good    Frequency Min 1X/week     Co-evaluation               AM-PAC PT "6 Clicks" Mobility  Outcome Measure Help needed turning from your back to your side while in a flat bed without using bedrails?: A Lot Help needed moving from lying on your back to sitting on the side of a flat bed without using bedrails?: A Lot Help needed moving to and from a bed to a chair (including a wheelchair)?: Total Help needed standing up from a chair using your arms (e.g., wheelchair or bedside chair)?: Total Help needed to walk in hospital room?: Total Help needed climbing 3-5 steps with a railing? : Total 6 Click Score: 8    End of Session Equipment Utilized During Treatment: Oxygen Activity Tolerance: Other (comment) (limited by weakness, lack of +3 assist, and pt confusion) Patient left: in bed;with call bell/phone within reach Nurse Communication: Mobility status;Need for lift equipment;Other (comment) (pt hallucinating) PT Visit Diagnosis: Muscle weakness (generalized) (M62.81);Difficulty in walking, not elsewhere classified (R26.2)    Time: 1610-9604 PT Time Calculation (min) (ACUTE ONLY): 23 min   Charges:   PT Evaluation $PT Eval Moderate Complexity: 1 Mod PT Treatments $Therapeutic Activity: 8-22 mins PT General Charges $$ ACUTE PT VISIT: 1 Visit         Virgil Benedict, PT, DPT Acute Rehabilitation Services  Office: [phone]   Bettina Gavia 08/01/2023, 4:22 PM

## 2023-08-01 NOTE — Progress Notes (Signed)
PHARMACY - ANTICOAGULATION  Pharmacy Consult for heparin Indication: atrial fibrillation  No Known Allergies  Patient Measurements: Height: 5' 7.01" (170.2 cm) Weight: (!) 190.1 kg (419 lb 1.5 oz) IBW/kg (Calculated) : 66.12 Heparin Dosing Weight: 117 kg  Vital Signs: Temp: 99.3 F (37.4 C) (12/07 0800) Temp Source: Oral (12/07 0800) BP: 123/69 (12/07 0700) Pulse Rate: 108 (12/07 0725)  Labs: Recent Labs    07/30/23 0511 07/31/23 0430 07/31/23 1205 08/01/23 0615  HGB 15.1 14.5  --  15.2  HCT 50.1 49.2  --  52.2*  PLT 134* 133*  --  160  HEPARINUNFRC 0.43 0.27* 0.30 0.40  CREATININE 2.05* 1.42*  --  1.58*    Estimated Creatinine Clearance: 73.2 mL/min (A) (by C-G formula based on SCr of 1.58 mg/dL (H)).  Assessment: 67 y.o. male with Afib. Pharmacy consulted for heparin infusion. No anticoagulants prior to admission  Heparin level 0.4 is therapeutic on 1900 units/hr.  Goal of Therapy:  Heparin level 0.3-0.7 units/ml Monitor platelets by anticoagulation protocol: Yes   Plan:  Continue heparin 1900 units/hr   Monitor daily heparin level, CBC, signs/symptoms of bleeding  F/u long term anticoagulation plan   Alphia Moh, PharmD, BCPS, BCCP Clinical Pharmacist  Please check AMION for all [facility] Pharmacy phone numbers After 10:00 PM, call Main Pharmacy [phone]

## 2023-08-01 NOTE — Procedures (Signed)
Intubation Procedure Note  Levi [name]  [id number]  [birth year]  Date:08/01/23  Time:5:17 PM   Provider Performing:Adolphe Fortunato V. Tanesha Arambula    Procedure: Intubation (31500)  Indication(s) Respiratory Failure  Consent Risks of the procedure as well as the alternatives and risks of each were explained to the patient and/or caregiver.  Consent for the procedure was obtained and is signed in the bedside chart   Anesthesia Etomidate, Versed, Fentanyl, and Rocuronium   Time Out Verified patient identification, verified procedure, site/side was marked, verified correct patient position, special equipment/implants available, medications/allergies/relevant history reviewed, required imaging and test results available.   Sterile Technique Usual hand hygeine, masks, and gloves were used   Procedure Description Patient positioned in bed supine.  Sedation given as noted above.  Patient was intubated with endotracheal tube using Glidescope.  View was Grade 2 only posterior commissure .  Number of attempts was 1.  Colorimetric CO2 detector was consistent with tracheal placement.   Complications/Tolerance None; patient tolerated the procedure well. Chest X-ray is ordered to verify placement.   EBL Minimal   Specimen(s) None  Laurabelle Gorczyca V. Vassie Loll MD

## 2023-08-01 NOTE — Progress Notes (Signed)
Upon initial assessment this afternoon patient appeared alert and oriented x4, able to follow commands. Roughly an hour later, patient had increased confusion and increased temp. Team notified. ABG obtained by RT. Patient experienced increased vomiting and desatting. RT and provider's at bedside to intubate. OG tube inserted and placed to LIWS. Patient and family updated in plan of care. ICU status maintained.

## 2023-08-02 ENCOUNTER — Inpatient Hospital Stay (HOSPITAL_COMMUNITY): Payer: Medicare HMO

## 2023-08-02 DIAGNOSIS — I5040 Unspecified combined systolic (congestive) and diastolic (congestive) heart failure: Secondary | ICD-10-CM | POA: Diagnosis not present

## 2023-08-02 DIAGNOSIS — J9602 Acute respiratory failure with hypercapnia: Secondary | ICD-10-CM | POA: Diagnosis not present

## 2023-08-02 DIAGNOSIS — J9601 Acute respiratory failure with hypoxia: Secondary | ICD-10-CM | POA: Diagnosis not present

## 2023-08-02 DIAGNOSIS — I4891 Unspecified atrial fibrillation: Secondary | ICD-10-CM | POA: Diagnosis not present

## 2023-08-02 DIAGNOSIS — I5041 Acute combined systolic (congestive) and diastolic (congestive) heart failure: Secondary | ICD-10-CM | POA: Diagnosis not present

## 2023-08-02 DIAGNOSIS — N179 Acute kidney failure, unspecified: Secondary | ICD-10-CM | POA: Diagnosis not present

## 2023-08-02 LAB — HEPARIN LEVEL (UNFRACTIONATED)
Heparin Unfractionated: 0.23 [IU]/mL — ABNORMAL LOW (ref 0.30–0.70)
Heparin Unfractionated: <0.1 [IU]/mL — ABNORMAL LOW (ref 0.30–0.70)

## 2023-08-02 LAB — CBC
HCT: 45.8 % (ref 39.0–52.0)
Hemoglobin: 13.3 g/dL (ref 13.0–17.0)
MCH: 25.3 pg — ABNORMAL LOW (ref 26.0–34.0)
MCHC: 29 g/dL — ABNORMAL LOW (ref 30.0–36.0)
MCV: 87.2 fL (ref 80.0–100.0)
Platelets: 164 10*3/uL (ref 150–400)
RBC: 5.25 MIL/uL (ref 4.22–5.81)
RDW: 19.1 % — ABNORMAL HIGH (ref 11.5–15.5)
WBC: 11.8 10*3/uL — ABNORMAL HIGH (ref 4.0–10.5)
nRBC: 0 % (ref 0.0–0.2)

## 2023-08-02 LAB — BASIC METABOLIC PANEL
Anion gap: 9 (ref 5–15)
BUN: 67 mg/dL — ABNORMAL HIGH (ref 8–23)
CO2: 30 mmol/L (ref 22–32)
Calcium: 8.7 mg/dL — ABNORMAL LOW (ref 8.9–10.3)
Chloride: 114 mmol/L — ABNORMAL HIGH (ref 98–111)
Creatinine, Ser: 2.28 mg/dL — ABNORMAL HIGH (ref 0.61–1.24)
GFR, Estimated: 30 mL/min — ABNORMAL LOW (ref >60)
Glucose, Bld: 114 mg/dL — ABNORMAL HIGH (ref 70–99)
Potassium: 4 mmol/L (ref 3.5–5.1)
Sodium: 153 mmol/L — ABNORMAL HIGH (ref 135–145)

## 2023-08-02 LAB — GLUCOSE, CAPILLARY
Glucose-Capillary: 114 mg/dL — ABNORMAL HIGH (ref 70–99)
Glucose-Capillary: 115 mg/dL — ABNORMAL HIGH (ref 70–99)
Glucose-Capillary: 118 mg/dL — ABNORMAL HIGH (ref 70–99)
Glucose-Capillary: 134 mg/dL — ABNORMAL HIGH (ref 70–99)
Glucose-Capillary: 135 mg/dL — ABNORMAL HIGH (ref 70–99)
Glucose-Capillary: 137 mg/dL — ABNORMAL HIGH (ref 70–99)

## 2023-08-02 LAB — MAGNESIUM: Magnesium: 2.5 mg/dL — ABNORMAL HIGH (ref 1.7–2.4)

## 2023-08-02 LAB — TROPONIN I (HIGH SENSITIVITY): Troponin I (High Sensitivity): 362 ng/L (ref <18)

## 2023-08-02 LAB — PHOSPHORUS: Phosphorus: 2.8 mg/dL (ref 2.5–4.6)

## 2023-08-02 MED ORDER — AMIODARONE HCL IN DEXTROSE 360-4.14 MG/200ML-% IV SOLN
INTRAVENOUS | Status: AC
  Administered 2023-08-02: 60 mg/h via INTRAVENOUS
  Filled 2023-08-02: qty 200

## 2023-08-02 MED ORDER — DEXTROSE 5 % IV SOLN: INTRAVENOUS | Status: DC

## 2023-08-02 MED ORDER — PIVOT 1.5 CAL PO LIQD
1000.0000 mL | ORAL | Status: DC
  Administered 2023-08-02: 1000 mL

## 2023-08-02 MED ORDER — AMIODARONE HCL IN DEXTROSE 360-4.14 MG/200ML-% IV SOLN: 60.0000 mg/h | INTRAVENOUS | Status: AC

## 2023-08-02 MED ORDER — FREE WATER
200.0000 mL | Status: DC
  Administered 2023-08-02 – 2023-08-04 (×8): 200 mL

## 2023-08-02 MED ORDER — AMIODARONE IV BOLUS ONLY 150 MG/100ML
150.0000 mg | Freq: Once | INTRAVENOUS | Status: AC
  Administered 2023-08-02: 150 mg via INTRAVENOUS
  Filled 2023-08-02: qty 100

## 2023-08-02 MED ORDER — AMIODARONE HCL IN DEXTROSE 360-4.14 MG/200ML-% IV SOLN
30.0000 mg/h | INTRAVENOUS | Status: DC
  Administered 2023-08-02 – 2023-08-05 (×6): 30 mg/h via INTRAVENOUS
  Filled 2023-08-02 (×7): qty 200

## 2023-08-02 NOTE — Progress Notes (Signed)
SLP Cancellation Note  Patient Details Name: Levi [name] MRN: [id number] DOB: 1956/03/16   Cancelled treatment:        Attermpted to see pt for FEES.  Pt has been reintubated and cannot participate in swallow assessment at this time.  Spoke with RN who is hopeful for extubation in the next few days.  SLP will monitor for medical readiness.   Kerrie Pleasure, MA, CCC-SLP Acute Rehabilitation Services Office: [phone] 08/02/2023, 10:32 AM

## 2023-08-02 NOTE — Progress Notes (Addendum)
PHARMACY - ANTICOAGULATION  Pharmacy Consult for heparin Indication: atrial fibrillation  No Known Allergies  Patient Measurements: Height: 5' 7.01" (170.2 cm) Weight: (!) 193 kg (425 lb 7.8 oz) IBW/kg (Calculated) : 66.12 Heparin Dosing Weight: 117 kg  Vital Signs: Temp: 99.7 F (37.6 C) (12/08 0330) Temp Source: Axillary (12/08 0330) BP: 120/64 (12/08 0700) Pulse Rate: 96 (12/08 0701)  Labs: Recent Labs    07/31/23 0430 07/31/23 1205 08/01/23 0615 08/01/23 1652 08/01/23 1843 08/02/23 0620  HGB 14.5  --  15.2 15.6 16.0 13.3  HCT 49.2  --  52.2* 46.0 47.0 45.8  PLT 133*  --  160  --   --  164  HEPARINUNFRC 0.27* 0.30 0.40  --   --  0.23*  CREATININE 1.42*  --  1.58*  --   --  2.28*    Estimated Creatinine Clearance: 51.3 mL/min (A) (by C-G formula based on SCr of 2.28 mg/dL (H)).  Assessment: 67 y.o. male with Afib. Pharmacy consulted for heparin infusion. No anticoagulants prior to admission  Heparin level 0.23 is subtherapeutic on 1900 units/hr. Patient has ben therapeutic on this rate so drop in level is unexpected.  No issues with infusion or bleeding per RN. Pending heart cath when more stable.    Goal of Therapy:  Heparin level 0.3-0.7 units/ml Monitor platelets by anticoagulation protocol: Yes   Plan:  Increase heparin 2000 units/hr   F/u 8hr level  Monitor daily heparin level, CBC, signs/symptoms of bleeding  F/u long term anticoagulation plan   Alphia Moh, PharmD, BCPS, BCCP Clinical Pharmacist  Please check AMION for all [facility] Pharmacy phone numbers After 10:00 PM, call Main Pharmacy [phone]

## 2023-08-02 NOTE — Progress Notes (Signed)
Inpatient Rehab Admissions Coordinator:   Per therapy recommendations, patient was screened for CIR candidacy by Megan Salon, MS, CCC-SLP. At this time, Pt. is not currently participating a level that he can tolerate the intensity of CIR; however,  Pt. may have potential to progress to becoming a potential CIR candidate, so CIR admissions team will follow and monitor for progress and participation with therapies and place consult order if Pt. appears to be an appropriate candidate. Please contact me with any questions.   Megan Salon, MS, CCC-SLP Rehab Admissions Coordinator  [phone] (celll) [phone] (office)

## 2023-08-02 NOTE — Progress Notes (Signed)
1558: Patient began to experience HR in the 160's; c/o chest pain. 75 mcg fentanyl bolus given at 1558. No change in HR at this time. BP fell to 77/39 (46) at 1600 and gradually came within parameters by 1620. Dr. Vassie Loll was paged at 1601. New orders received at 1611 for 12-lead EKG, troponin level, and amiodarone bolus. Cards updated at 1618 and is in agreement with the treatment plan and ordered an amiodarone drip and d/c the amiodarone PO.   1643: HR back within parameters after amiodarone bolus is completed. Still c/o pain  1734: Critical lab received for troponin: 362. Dr Vassie Loll notified at 1735 of critical result. New orders received for 12 lead placed at 1741. Cards fellow was paged concerning critical result and continued chest pain at 1751. Cards followed up with this RN at 1758 and has no new orders at this time.   RN will continue to follow patient treatment plan.   08/02/2023 Oralia Manis, RN 6:42 PM

## 2023-08-02 NOTE — Progress Notes (Signed)
NAME:  Levi [name], MRN:  [id number], DOB:  1955-11-13, LOS: 9 ADMISSION DATE:  07/24/2023, CONSULTATION DATE: 07/24/2023  REFERRING MD: Marlene Bast, CHIEF COMPLAINT: Shortness of breath and hypoxia  History of Present Illness:  67 year old man with morbid obesity, probably untreated OSA/OHS and knee OA who presented to ED with dry cough, DOE, SOB, LL edema, orthopnea for 1-2 weeks. Home health RN found his SpO2 64% on RA. Given a dose of Albuterol. Denied wheezing, CP, N/V/D, abd pain, f/c/r, and rash. Not on home O2 or NIV.  No smoking, illicit drug use, or alcoholism.  No hx of COPD, asthma, VTE, cardiac hx, liver disease, or CKD Developed runs of VT in ED. Admitted with biventricular failure , H. influenzae pneumonia.  Course complicated by seizures and aspiration episode  Pertinent Medical History:  History reviewed. No pertinent past medical history.  Significant Hospital Events: Including procedures, antibiotic start and stop dates in addition to other pertinent events   11/29 Admitted to [facility] 12/3 Trach aspirate sent for thick secretions. Amiodarone started for AF w/ bigeminy and trigeminy. New onset rhythmic twitching left face, LLE. Resolved spontaneously. 12/4 Seen by Neuro: myoclonic jerks noted on Cerib[employer] monitor but not able to distinguish if seizure activity. Transferred to [facility] for LTM. Started Keppra.  Febrile. Sats 88%. LEUS negative for DVT. LTM neg for seizure so Keppra stopped later that day.  Had been started on vanc and zosyn 12/3 for fever CT was neg for stroke. Vanc stopped 12/5 sputum H flu BL positive. Change to CTX. More awake. Trying to wean sedation to assess weaning ability. Renal fxn improved after holding Lasix. 12/6 extubated , BiPAP DC'd due to vomiting 12/7 recurrent vomiting with aspiration >> reintubated  Interim History / Subjective:   He was reintubated yesterday and OG tube placed to LIS. Critically ill, on fentanyl drip Low urine output  although 9.5 L negative balance   Objective:  Blood pressure 117/71, pulse 97, temperature 99.5 F (37.5 C), temperature source Axillary, resp. rate 17, height 5' 7.01" (1.702 m), weight (!) 193 kg, SpO2 92%. CVP:  [3 mmHg-19 mmHg] 14 mmHg  Vent Mode: PRVC FiO2 (%):  [40 %-100 %] 40 % Set Rate:  [16 bmp-24 bmp] 24 bmp Vt Set:  [520 [id number]-[id number] mL] 520 mL PEEP:  [8 cmH20] 8 cmH20 Plateau Pressure:  [23 cmH20-26 cmH20] 26 cmH20   Intake/Output Summary (Last 24 hours) at 08/02/2023 0910 Last data filed at 08/02/2023 0700 Gross per 24 hour  Intake 1939.88 ml  Output 560 ml  Net 1379.88 ml   Filed Weights   07/31/23 0500 08/01/23 0500 08/02/23 0500  Weight: (!) 191.9 kg (!) 190.1 kg (!) 193 kg   Physical Examination: General: Acute-on-chronically ill-appearing morbidly obese middle-aged man in NAD.  HEENT: [place]/AT, anicteric sclera, PERRL, moist mucous membranes. Neuro: Awake, on fentanyl drip, RASS 0 to +1, CV: S1-S2 regular, distant PULM: Bilateral distant ventilated breath sounds, no accessory muscle use GI: Obese, soft, nontender, nondistended. Hypoactive bowel sounds. Extremities: Bilateral symmetric 1+ chronic LE edema noted. Skin: Warm/dry, no rashes.  Labs show hypernatremia 153, BUN/creatinine increased to 67/2.3, stable mild leukocytosis leukocytosis  Chest x-ray shows ET tube in position, unchanged perihilar and basilar infiltrates with effusions  Assessment & Plan:   Acute-on-chronic systolic heart failure Echo from 11/30 with EF <20%, G1DD, severely reduced RVSF, severely enlarged RV - Cardiology following, appreciate assistance; considering R/LHC when more stable - GDMT can start now that enteral access established -Does not  seem to need inotropes, last co-ox was 74% - Cardiac monitoring/tele   Acute Hypoxic and Hypercapnic respiratory failure -secondary to pulmonary edema and complicated further by BL positive Haemophilus influenza PNA (12/3) , reintubated 12/7  for aspiration pneumonia Presumed OSA/OHS  - initially Zosyn x 3 days transitioned to ceftriaxone , would extend few more days given repeat aspiration episode on 12/7 -Spontaneous breathing trials but hold off extubation -Needs nocturnal BiPAP postextubation, would like abdominal issues to be resolved before extubating   Ileus/vomiting -Reglan for 6 doses   New onset AF w/ intermittent ventricular ectopy, CHA2DS2/VASC score 2.  Bigeminy/trigeminy VT Started on amio and heparin 12/3.  - Cardiac monitoring - Optimize electrolytes for K > 4, Mg > 2  Acute metabolic encephalopathy  Improved c/w yesterday.  Myoclonic jerks attributed to uremia and sedation Possible seizure. Non witnessed by LTM. Felt metabolic in nature. CT head obtained and it was reassuring w/out new clear stroke/injury - Monitor off of AEDs - Keppra discontinued 12/4 -Fentanyl drip with intermittent Versed, goal RASS 0 to -1   Acute kidney injury w/ rising Cr in context of on-going diuresis  Renal fxn improved w/ holding lasix. - Trend BMP, Cr improving - Replete electrolytes as indicated - Monitor I&Os, goal euvolemia - Avoid nephrotoxic agents as able Hypernatremia -add free water  Congestive hepatopathy, improving - Supportive care - Intermittent LFT monitoring  Uncontrolled diabetes, now hypoglycemic while n.p.o. - SSI - CBGs Q4H - Goal CBG 140-180 -Decreased Semglee to 8 units daily, continue D5W which would also help with hypernatremia  Morbid Obesity - Counseling re: weight loss/lifestyle changes  Best Practice: (right click and "Reselect all SmartList Selections" daily)   Diet/type: tubefeeds DVT prophylaxis: IV heparin started 12/3   Pressure ulcer(s): not present on admission  GI prophylaxis: H2B Lines: Central line Foley:  Yes, and it is still needed Code Status:  full code Last date of multidisciplinary goals of care discussion -updated spouse  Critical care time: 7m   The patient  is critically ill with multiple organ system failure and requires high complexity decision making for assessment and support, frequent evaluation and titration of therapies, advanced monitoring, review of radiographic studies and interpretation of complex data.   Critical Care Time devoted to patient care services, exclusive of separately billable procedures, described in this note is 35 minutes.  Comer Locket Vassie Loll, MD [facility] 08/02/23 9:10 AM  Please see [email or web address] for pager details.  From 7A-7P if no response, please call [phone] After hours, please call ELink [phone]\

## 2023-08-02 NOTE — Progress Notes (Signed)
RT note. Sputum sample collected at this time and sent to lab.

## 2023-08-02 NOTE — Progress Notes (Signed)
eLink Physician-Brief Progress Note Patient Name: DETAVIOUS BAINBRIDGE DOB: 08-01-1956 MRN: [id number]   Date of Service  08/02/2023  HPI/Events of Note  Nurse reports that pt has had no UO since 5pm yesterday.  Pt has no urge to urinate.  Bladder scan limited due to body habitus.  I/O shows pt to be net negative 9L since admission  eICU Interventions  Will perform straight cath to check for urinary retention        Jalana Moore M DELA CRUZ 08/02/2023, 4:04 AM

## 2023-08-02 NOTE — Progress Notes (Signed)
PHARMACY - ANTICOAGULATION  Pharmacy Consult for heparin Indication: atrial fibrillation  No Known Allergies  Patient Measurements: Height: 5' 7.01" (170.2 cm) Weight: (!) 193 kg (425 lb 7.8 oz) IBW/kg (Calculated) : 66.12 Heparin Dosing Weight: 117 kg  Vital Signs: Temp: 100.6 F (38.1 C) (12/08 1200) Temp Source: Axillary (12/08 1200) BP: 125/69 (12/08 1400) Pulse Rate: 95 (12/08 1513)  Labs: Recent Labs    07/31/23 0430 07/31/23 1205 08/01/23 0615 08/01/23 1652 08/01/23 1843 08/02/23 0620 08/02/23 1515  HGB 14.5  --  15.2 15.6 16.0 13.3  --   HCT 49.2  --  52.2* 46.0 47.0 45.8  --   PLT 133*  --  160  --   --  164  --   HEPARINUNFRC 0.27*   < > 0.40  --   --  0.23* <0.10*  CREATININE 1.42*  --  1.58*  --   --  2.28*  --    < > = values in this interval not displayed.    Estimated Creatinine Clearance: 51.3 mL/min (A) (by C-G formula based on SCr of 2.28 mg/dL (H)).  Assessment: 67 y.o. male with Afib. Pharmacy consulted for heparin infusion. No anticoagulants prior to admission  Heparin level 0.23 is subtherapeutic on 1900 units/hr. Patient has ben therapeutic on this rate so drop in level is unexpected.  No issues with infusion or bleeding per RN. Pending heart cath when more stable.   Heparin level came back undetectable this afternoon due to defective IV site per Rn. It has been moved to CVC. We will continue with current rate and check level in 8 hr.   Goal of Therapy:  Heparin level 0.3-0.7 units/ml Monitor platelets by anticoagulation protocol: Yes   Plan:  Cont heparin 2000 units/hr   F/u 8hr level  Monitor daily heparin level, CBC, signs/symptoms of bleeding  F/u long term anticoagulation plan  Ulyses Southward, PharmD, BCIDP, AAHIVP, CPP Infectious Disease Pharmacist 08/02/2023 4:18 PM

## 2023-08-02 NOTE — Progress Notes (Signed)
[name] Date of Encounter: 08/02/2023 [facility] Cardiologist: None   Interval Summary  .    Re-intubated again yesterday for airway protection - recurrent issues with vomiting and aspiration.  Remains in sinus rhythm on amiodarone.  Net IO Since Admission: -9,529.09 mL [08/02/23 1333]  Vital Signs .    Vitals:   08/02/23 1000 08/02/23 1055 08/02/23 1100 08/02/23 1200  BP: (!) 140/59  127/85 128/73  Pulse: 96 (!) 101 (!) 101 (!) 102  Resp: (!) 23 (!) 24 (!) 24 (!) 28  Temp:    (!) 100.6 F (38.1 C)  TempSrc:    Axillary  SpO2: 96% 94% 90% 95%  Weight:      Height:        Intake/Output Summary (Last 24 hours) at 08/02/2023 1333 Last data filed at 08/02/2023 0700 Gross per 24 hour  Intake 1538.49 ml  Output 560 ml  Net 978.49 ml      08/02/2023    5:00 AM 08/01/2023    5:00 AM 07/31/2023    5:00 AM  Last 3 Weights  Weight (lbs) 425 lb 7.8 oz 419 lb 1.5 oz 423 lb 1 oz  Weight (kg) 193 kg 190.1 kg 191.9 kg      Telemetry/ECG    Sinus rhythm- Personally Reviewed  CV Studies    Echocardiogram 07/25/2023 1. Left ventricular ejection fraction, by estimation, is <20%. The left  ventricle has severely decreased function. The left ventricle demonstrates  global hypokinesis. The left ventricular internal cavity size was mildly  dilated. There is mild concentric   left ventricular hypertrophy. Left ventricular diastolic parameters are  consistent with Grade I diastolic dysfunction (impaired relaxation).   2. Right ventricular systolic function is severely reduced. The right  ventricular size is severely enlarged. There is normal pulmonary artery  systolic pressure. The estimated right ventricular systolic pressure is  29.4 mmHg.   3. The mitral valve is grossly normal. No evidence of mitral valve  regurgitation. No evidence of mitral stenosis.   4. The aortic valve is tricuspid. Aortic valve regurgitation is trivial.  No aortic stenosis is  present.   5. There is mild dilatation of the ascending aorta, measuring 41 mm.   6. The inferior vena cava is dilated in size with <50% respiratory  variability, suggesting right atrial pressure of 15 mmHg.   Comparison(s): No prior Echocardiogram.   Physical Exam .    General appearance: alert, no distress, morbidly obese, and plesant Neck: supple, symmetrical, trachea midline and neck thick Lungs: diminished breath sounds bilaterally and wheezes LLL Heart: regular tachycardia Abdomen: morbidly obese Extremities: edema 2+ and venous stasis dermatitis noted Pulses: 2+ and symmetric Skin: Skin color, texture, turgor normal. No rashes or lesions Neurologic: Grossly normal Psych: Pleasant   Patient Profile    Levi [name] is a 67 y.o. male has hx of no past medical history, morbid obesity.  Admitted on 07/25/2023 due to shortness of breath, lower extremity edema, orthopnea.  At home was hypoxic with oxygen saturations 64% on room air.  On arrival required BiPAP however was later intubated.  Cardiology asked to see due to new onset biventricular heart failure  Assessment & Plan .    Acute biventricular heart failure. Acute heart failure with reduced EF Cardiomyopathy, unspecified and newly discovered New onset of atrial fibrillation with rapid ventricular rate -now paroxysmal Hypercapnic/hypoxic respiratory failure-vent support Acute H.flu infection.  Acute kidney injury - improving Bilateral multifocal pneumonia with  gram-negative rods Acute metabolic encephalopathy - more awake now  Diabetes mellitus Aortic atherosclerosis Morbid obesity-BMI 68   Acute biventricular heart failure. Acute heart failure with reduced EF Cardiomyopathy, unspecified and newly discovered Re-intubated for airway protection with vomiting and aspiration Currently not on pressor or inotropic support. Co-ox have remained acceptable.  Has diuresed Net IO Since Admission: -9,529.09 mL [08/02/23 1333]   High sensitive troponins peaked at 371 due to demand ischemia.  LDL calculated 87. BP has improved - HR elevated today Would likely need L/RHC in the future, however, not stable for this at present. Need outpatient sleep apnea workup.   New onset of atrial fibrillation Likely secondary to hypercapnic/hypoxic respiratory failure, cardiomyopathy, and biventricular heart failure. Now on amiodarone per tube - would continue Telemetry notes paroxysmal A-fib episodes. Currently on IV heparin and amiodarone drip. Early this week there was concern that amiodarone could be contributing to his encephalopathy and I recommended to hold if needed. He remains on it. Spoke to CCM they plan to keep the drip for now and will change to oral s/p extubation.  Click Here to Calculate/Change CHADS2VASc Score The patient's CHADS2-VASc score is 4, indicating a 4.8% annual risk of stroke.   CHF History: Yes HTN History: No Diabetes History: Yes Stroke History: No Vascular Disease History: Yes  AKI: Creatinine worse today. Hold off on further diuretics. Sodium is climbing - may be free water deficit.  Bilateral multifocal pneumonia with gram-negative rods - H.Flu: ICU following. Low grade temp today. WBC rising - now 12K today, from 7K.  Acute metabolic encephalopathy:  Multifactorial.  Vomiting/aspiration Suspect ileus - now on reglan. Abd xray yesterday showed mild gastric distention  Levi Nose, MD, [facility], FACP  [place]  [facility]  Medical Director of the [facility]  [facility] Reduction [facility] Diplomate of the American Board of Clinical Lipidology Attending Cardiologist  Direct Dial: [phone]  Fax: [phone]  Website:  [email or web address]  08/02/2023  1:33 PM

## 2023-08-03 ENCOUNTER — Inpatient Hospital Stay (HOSPITAL_COMMUNITY): Payer: Medicare HMO

## 2023-08-03 ENCOUNTER — Inpatient Hospital Stay (HOSPITAL_COMMUNITY): Payer: Medicare HMO | Admitting: Anesthesiology

## 2023-08-03 DIAGNOSIS — I4891 Unspecified atrial fibrillation: Secondary | ICD-10-CM

## 2023-08-03 DIAGNOSIS — I5041 Acute combined systolic (congestive) and diastolic (congestive) heart failure: Secondary | ICD-10-CM | POA: Diagnosis not present

## 2023-08-03 DIAGNOSIS — J9601 Acute respiratory failure with hypoxia: Secondary | ICD-10-CM | POA: Diagnosis not present

## 2023-08-03 DIAGNOSIS — N179 Acute kidney failure, unspecified: Secondary | ICD-10-CM | POA: Diagnosis not present

## 2023-08-03 DIAGNOSIS — I5040 Unspecified combined systolic (congestive) and diastolic (congestive) heart failure: Secondary | ICD-10-CM | POA: Diagnosis not present

## 2023-08-03 DIAGNOSIS — L8990 Pressure ulcer of unspecified site, unspecified stage: Secondary | ICD-10-CM | POA: Insufficient documentation

## 2023-08-03 DIAGNOSIS — I480 Paroxysmal atrial fibrillation: Secondary | ICD-10-CM | POA: Diagnosis not present

## 2023-08-03 DIAGNOSIS — J9602 Acute respiratory failure with hypercapnia: Secondary | ICD-10-CM | POA: Diagnosis not present

## 2023-08-03 DIAGNOSIS — E870 Hyperosmolality and hypernatremia: Secondary | ICD-10-CM

## 2023-08-03 LAB — PHOSPHORUS: Phosphorus: 2 mg/dL — ABNORMAL LOW (ref 2.5–4.6)

## 2023-08-03 LAB — POCT I-STAT 7, (LYTES, BLD GAS, ICA,H+H)
Acid-Base Excess: 3 mmol/L — ABNORMAL HIGH (ref 0.0–2.0)
Acid-Base Excess: 4 mmol/L — ABNORMAL HIGH (ref 0.0–2.0)
Bicarbonate: 33.7 mmol/L — ABNORMAL HIGH (ref 20.0–28.0)
Bicarbonate: 33.4 mmol/L — ABNORMAL HIGH (ref 20.0–28.0)
Calcium, Ion: 1.26 mmol/L (ref 1.15–1.40)
Calcium, Ion: 1.24 mmol/L (ref 1.15–1.40)
HCT: 46 % (ref 39.0–52.0)
HCT: 45 % (ref 39.0–52.0)
Hemoglobin: 15.6 g/dL (ref 13.0–17.0)
Hemoglobin: 15.3 g/dL (ref 13.0–17.0)
O2 Saturation: 97 %
O2 Saturation: 98 %
Patient temperature: 99.7
Patient temperature: 99.7
Potassium: 3.9 mmol/L (ref 3.5–5.1)
Potassium: 3.9 mmol/L (ref 3.5–5.1)
Sodium: 153 mmol/L — ABNORMAL HIGH (ref 135–145)
Sodium: 153 mmol/L — ABNORMAL HIGH (ref 135–145)
TCO2: 36 mmol/L — ABNORMAL HIGH (ref 22–32)
TCO2: 36 mmol/L — ABNORMAL HIGH (ref 22–32)
pCO2 arterial: 86.4 mm[Hg] (ref 32–48)
pCO2 arterial: 74.6 mm[Hg] (ref 32–48)
pH, Arterial: 7.203 — ABNORMAL LOW (ref 7.35–7.45)
pH, Arterial: 7.263 — ABNORMAL LOW (ref 7.35–7.45)
pO2, Arterial: 119 mm[Hg] — ABNORMAL HIGH (ref 83–108)
pO2, Arterial: 119 mm[Hg] — ABNORMAL HIGH (ref 83–108)

## 2023-08-03 LAB — BASIC METABOLIC PANEL
Anion gap: 9 (ref 5–15)
BUN: 58 mg/dL — ABNORMAL HIGH (ref 8–23)
CO2: 30 mmol/L (ref 22–32)
Calcium: 8.1 mg/dL — ABNORMAL LOW (ref 8.9–10.3)
Chloride: 113 mmol/L — ABNORMAL HIGH (ref 98–111)
Creatinine, Ser: 1.7 mg/dL — ABNORMAL HIGH (ref 0.61–1.24)
GFR, Estimated: 43 mL/min — ABNORMAL LOW (ref >60)
Glucose, Bld: 152 mg/dL — ABNORMAL HIGH (ref 70–99)
Potassium: 3.4 mmol/L — ABNORMAL LOW (ref 3.5–5.1)
Sodium: 152 mmol/L — ABNORMAL HIGH (ref 135–145)

## 2023-08-03 LAB — CBC
HCT: 44 % (ref 39.0–52.0)
Hemoglobin: 12.6 g/dL — ABNORMAL LOW (ref 13.0–17.0)
MCH: 25 pg — ABNORMAL LOW (ref 26.0–34.0)
MCHC: 28.6 g/dL — ABNORMAL LOW (ref 30.0–36.0)
MCV: 87.5 fL (ref 80.0–100.0)
Platelets: 181 K/uL (ref 150–400)
RBC: 5.03 MIL/uL (ref 4.22–5.81)
RDW: 19.1 % — ABNORMAL HIGH (ref 11.5–15.5)
WBC: 10.4 K/uL (ref 4.0–10.5)
nRBC: 0 % (ref 0.0–0.2)

## 2023-08-03 LAB — GLUCOSE, CAPILLARY
Glucose-Capillary: 153 mg/dL — ABNORMAL HIGH (ref 70–99)
Glucose-Capillary: 180 mg/dL — ABNORMAL HIGH (ref 70–99)
Glucose-Capillary: 158 mg/dL — ABNORMAL HIGH (ref 70–99)
Glucose-Capillary: 143 mg/dL — ABNORMAL HIGH (ref 70–99)
Glucose-Capillary: 135 mg/dL — ABNORMAL HIGH (ref 70–99)
Glucose-Capillary: 127 mg/dL — ABNORMAL HIGH (ref 70–99)

## 2023-08-03 LAB — APTT: aPTT: 49 s — ABNORMAL HIGH (ref 24–36)

## 2023-08-03 LAB — HEPARIN LEVEL (UNFRACTIONATED)
Heparin Unfractionated: 0.12 [IU]/mL — ABNORMAL LOW (ref 0.30–0.70)
Heparin Unfractionated: 0.11 [IU]/mL — ABNORMAL LOW (ref 0.30–0.70)
Heparin Unfractionated: 0.27 [IU]/mL — ABNORMAL LOW (ref 0.30–0.70)

## 2023-08-03 LAB — MAGNESIUM: Magnesium: 2.4 mg/dL (ref 1.7–2.4)

## 2023-08-03 MED ORDER — FENTANYL 2500MCG IN NS 250ML (10MCG/ML) PREMIX INFUSION
0.0000 ug/h | INTRAVENOUS | Status: DC
  Administered 2023-08-03: 50 ug/h via INTRAVENOUS
  Administered 2023-08-04 – 2023-08-08 (×6): 75 ug/h via INTRAVENOUS
  Filled 2023-08-03 (×7): qty 250

## 2023-08-03 MED ORDER — PROPOFOL 1000 MG/100ML IV EMUL
INTRAVENOUS | Status: AC
  Administered 2023-08-04: 25 ug/kg/min via INTRAVENOUS
  Filled 2023-08-03: qty 100

## 2023-08-03 MED ORDER — POTASSIUM PHOSPHATES 15 MMOLE/5ML IV SOLN
15.0000 mmol | Freq: Once | INTRAVENOUS | Status: AC
  Administered 2023-08-03: 15 mmol via INTRAVENOUS
  Filled 2023-08-03: qty 5

## 2023-08-03 MED ORDER — JEVITY 1.5 CAL/FIBER PO LIQD
1000.0000 mL | ORAL | Status: DC
  Filled 2023-08-03: qty 1000

## 2023-08-03 MED ORDER — FENTANYL CITRATE PF 50 MCG/ML IJ SOSY
PREFILLED_SYRINGE | INTRAMUSCULAR | Status: AC
  Filled 2023-08-03: qty 2

## 2023-08-03 MED ORDER — JEVITY 1.2 CAL PO LIQD
1000.0000 mL | ORAL | Status: DC
  Filled 2023-08-03: qty 1000

## 2023-08-03 MED ORDER — ROCURONIUM BROMIDE 10 MG/ML (PF) SYRINGE
PREFILLED_SYRINGE | INTRAVENOUS | Status: AC
  Administered 2023-08-03: 100 mg
  Filled 2023-08-03: qty 10

## 2023-08-03 MED ORDER — PROSOURCE TF20 ENFIT COMPATIBL EN LIQD: 60.0000 mL | Freq: Two times a day (BID) | ENTERAL | Status: DC

## 2023-08-03 MED ORDER — ETOMIDATE 2 MG/ML IV SOLN
INTRAVENOUS | Status: AC
  Administered 2023-08-03: 20 mg
  Filled 2023-08-03: qty 20

## 2023-08-03 MED ORDER — MIDAZOLAM HCL 2 MG/2ML IJ SOLN
INTRAMUSCULAR | Status: AC
  Filled 2023-08-03: qty 2

## 2023-08-03 MED ORDER — SUCCINYLCHOLINE CHLORIDE 200 MG/10ML IV SOSY
PREFILLED_SYRINGE | INTRAVENOUS | Status: AC
  Filled 2023-08-03: qty 10

## 2023-08-03 MED ORDER — AMIODARONE IV BOLUS ONLY 150 MG/100ML: 150.0000 mg | Freq: Once | INTRAVENOUS | Status: DC

## 2023-08-03 MED ORDER — PROSOURCE TF20 ENFIT COMPATIBL EN LIQD
60.0000 mL | Freq: Three times a day (TID) | ENTERAL | Status: DC
  Administered 2023-08-04 (×2): 60 mL
  Filled 2023-08-03 (×2): qty 60

## 2023-08-03 MED ORDER — AMIODARONE LOAD VIA INFUSION
150.0000 mg | Freq: Once | INTRAVENOUS | Status: AC
  Administered 2023-08-03: 150 mg via INTRAVENOUS
  Filled 2023-08-03: qty 83.34

## 2023-08-03 MED ORDER — HEPARIN (PORCINE) 25000 UT/250ML-% IV SOLN: 2900.0000 [IU]/h | INTRAVENOUS | Status: DC

## 2023-08-03 MED ORDER — ORAL CARE MOUTH RINSE: 15.0000 mL | OROMUCOSAL | Status: DC | PRN

## 2023-08-03 MED ORDER — HEPARIN (PORCINE) 25000 UT/250ML-% IV SOLN
2700.0000 [IU]/h | INTRAVENOUS | Status: DC
  Administered 2023-08-04: 2700 [IU]/h via INTRAVENOUS
  Filled 2023-08-03: qty 250

## 2023-08-03 MED ORDER — ORAL CARE MOUTH RINSE
15.0000 mL | OROMUCOSAL | Status: DC
  Administered 2023-08-03 – 2023-08-04 (×4): 15 mL via OROMUCOSAL

## 2023-08-03 MED ORDER — KETAMINE HCL 50 MG/5ML IJ SOSY
PREFILLED_SYRINGE | INTRAMUSCULAR | Status: AC
  Filled 2023-08-03: qty 10

## 2023-08-03 MED ORDER — PROPOFOL 1000 MG/100ML IV EMUL
0.0000 ug/kg/min | INTRAVENOUS | Status: DC
  Administered 2023-08-03: 10 ug/kg/min via INTRAVENOUS
  Administered 2023-08-04: 30 ug/kg/min via INTRAVENOUS
  Administered 2023-08-04: 25 ug/kg/min via INTRAVENOUS
  Administered 2023-08-04: 30 ug/kg/min via INTRAVENOUS
  Administered 2023-08-04 (×3): 25 ug/kg/min via INTRAVENOUS
  Administered 2023-08-04: 20 ug/kg/min via INTRAVENOUS
  Administered 2023-08-05 – 2023-08-08 (×28): 30 ug/kg/min via INTRAVENOUS
  Filled 2023-08-03 (×8): qty 100
  Filled 2023-08-03: qty 200
  Filled 2023-08-03 (×19): qty 100
  Filled 2023-08-03: qty 200
  Filled 2023-08-03 (×6): qty 100

## 2023-08-03 NOTE — Progress Notes (Signed)
Brief PCCM progress note:  Afternoon rounds about 620 patient was a bit lethargic appearing.  Slumped in bed on his back supine.  Woke him up.  Had him try to set up.  Told me his name.  A few minutes later I was called.  Tachycardic to 150s desaturation.  Placed back on BiPAP.  SVT reportedly on the monitor, has atrial fibrillation suspect this is the issue with RVR. --Placed on BiPAP with improvement oxygen saturation, see if mental status also improves --Amiodarone bolus 150 mg IV, continue amiodarone drip

## 2023-08-03 NOTE — Progress Notes (Signed)
SLP Cancellation Note  Patient Details Name: Levi [name] MRN: [id number] DOB: [birth year]   Cancelled treatment:       Reason Eval/Treat Not Completed: Medical issues which prohibited therapy. Pt on vent as of this am. Per MD note, hoping for resolution of abdominal issues prior to extubation. Will continue to follow.    Mahala Menghini., M.A. CCC-SLP Acute Rehabilitation Services Office [phone]  Secure chat preferred  08/03/2023, 7:59 AM

## 2023-08-03 NOTE — Progress Notes (Signed)
PHARMACY - ANTICOAGULATION  Pharmacy Consult for heparin Indication: atrial fibrillation  No Known Allergies  Patient Measurements: Height: 5' 7.01" (170.2 cm) Weight: (!) 193 kg (425 lb 7.8 oz) IBW/kg (Calculated) : 66.12 Heparin Dosing Weight: 117 kg  Vital Signs: Temp: 100.1 F (37.8 C) (12/09 0000) Temp Source: Axillary (12/09 0000) BP: 151/66 (12/09 0000) Pulse Rate: 90 (12/09 0000)  Labs: Recent Labs    07/31/23 0430 07/31/23 1205 08/01/23 0615 08/01/23 1652 08/01/23 1843 08/02/23 0620 08/02/23 1515 08/02/23 1615 08/03/23 0035  HGB 14.5  --  15.2 15.6 16.0 13.3  --   --   --   HCT 49.2  --  52.2* 46.0 47.0 45.8  --   --   --   PLT 133*  --  160  --   --  164  --   --   --   HEPARINUNFRC 0.27*   < > 0.40  --   --  0.23* <0.10*  --  0.12*  CREATININE 1.42*  --  1.58*  --   --  2.28*  --   --   --   TROPONINIHS  --   --   --   --   --   --   --  362*  --    < > = values in this interval not displayed.    Estimated Creatinine Clearance: 51.3 mL/min (A) (by C-G formula based on SCr of 2.28 mg/dL (H)).  Assessment: 67 y.o. male with Afib. Pharmacy consulted for heparin infusion. No anticoagulants prior to admission  Heparin level 0.23 is subtherapeutic on 1900 units/hr. Patient has ben therapeutic on this rate so drop in level is unexpected.  No issues with infusion or bleeding per RN. Pending heart cath when more stable.   12/9 AM: heparin level returned at 0.12 on 2000 units/hr (subtherapeutic) after site change of heparin to CVC. Per RN, no issues with the heparin infusion running continuously or signs/symptoms of bleeding. Last CBC stable (Hgb 16 >13)  Goal of Therapy:  Heparin level 0.3-0.7 units/ml Monitor platelets by anticoagulation protocol: Yes   Plan:  Increase heparin 2300 units/hr   F/u 8hr level  Monitor daily heparin level, CBC, signs/symptoms of bleeding  F/u long term anticoagulation plan  Arabella Merles, PharmD. Clinical  Pharmacist 08/03/2023 1:04 AM

## 2023-08-03 NOTE — Procedures (Signed)
Cortrak  Person Inserting Tube:  Greig Castilla D, RD Tube Type:  Cortrak - 43 inches Tube Size:  10 Tube Location:  Left nare Secured by: Bridle Technique Used to Measure Tube Placement:  Marking at nare/corner of mouth Cortrak Secured At:  71 cm Procedure Comments:  Cortrak Tube Team Note:  Consult received to place a Cortrak feeding tube.   X-ray is required, abdominal x-ray has been ordered by the Cortrak team. Please confirm tube placement before using the Cortrak tube.   If the tube becomes dislodged please keep the tube and contact the Cortrak team at [email or web address] for replacement.  If after hours and replacement cannot be delayed, place a NG tube and confirm placement with an abdominal x-ray.    Greig Castilla, RD, LDN Registered Dietitian II RD pager # available in AMION  After hours/weekend pager # available in [facility]

## 2023-08-03 NOTE — Progress Notes (Signed)
Rounding Note    [name] Date of Encounter: 08/03/2023  [facility] Cardiologist: None   Subjective   Renal function improving, Cr 2.3>1.7.  BP 140/78.  Extubated this morning, transition to BiPAP.  He is confused  Inpatient Medications    Scheduled Meds:  aspirin  81 mg Per Tube Daily   Chlorhexidine Gluconate Cloth  6 each Topical Daily   famotidine  20 mg Per Tube Daily   feeding supplement (PIVOT 1.5 CAL)  1,000 mL Per Tube Q24H   free water  200 mL Per Tube Q4H   Gerhardt's butt cream   Topical BID   insulin aspart  0-20 Units Subcutaneous Q4H   insulin glargine-yfgn  8 Units Subcutaneous Daily   nystatin cream   Topical TID   mouth rinse  15 mL Mouth Rinse 4 times per day   Continuous Infusions:  amiodarone 30 mg/hr (08/03/23 1100)   cefTRIAXone (ROCEPHIN)  IV Stopped (08/02/23 1333)   heparin 2,300 Units/hr (08/03/23 1100)   potassium PHOSPHATE IVPB (in mmol) 43 mL/hr at 08/03/23 1100   PRN Meds: acetaminophen **OR** acetaminophen, albuterol, fentaNYL, metoprolol tartrate, midazolam, ondansetron (ZOFRAN) IV, mouth rinse, polyethylene glycol   Vital Signs    Vitals:   08/03/23 0900 08/03/23 1000 08/03/23 1023 08/03/23 1100  BP: 134/66 134/78  (!) 148/78  Pulse: 87 90 88 86  Resp: (!) 21 (!) 22 (!) 21 19  Temp:      TempSrc:      SpO2: 92% 90% 96% 95%  Weight:      Height:        Intake/Output Summary (Last 24 hours) at 08/03/2023 1141 Last data filed at 08/03/2023 1100 Gross per 24 hour  Intake 3525.59 ml  Output 1550 ml  Net 1975.59 ml      08/03/2023    5:00 AM 08/02/2023    5:00 AM 08/01/2023    5:00 AM  Last 3 Weights  Weight (lbs) 426 lb 13 oz 425 lb 7.8 oz 419 lb 1.5 oz  Weight (kg) 193.6 kg 193 kg 190.1 kg      Telemetry    NSR - Personally Reviewed  ECG    No new ECG - Personally Reviewed  Physical Exam   GEN: Confused Neck: Difficult to assess JVD given habitus Cardiac: RRR, no murmurs, rubs, or  gallops.  Respiratory: Diminished breath sounds GI: Soft, nontender MS: No edema Neuro:  Confused Psych: Unable to assess  Labs    High Sensitivity Troponin:   Recent Labs  Lab 07/24/23 1815 07/24/23 2326 07/25/23 0805 07/25/23 1148 08/02/23 1615  TROPONINIHS 169* 156* 371* 302* 362*     Chemistry Recent Labs  Lab 07/29/23 0433 07/30/23 0511 07/30/23 0853 07/31/23 0430 08/01/23 0615 08/01/23 1652 08/01/23 1843 08/02/23 0620 08/03/23 0543  NA 143 143  --  149* 153*   < > 153* 153* 152*  K 4.1 3.4*  --  3.5 3.9   < > 4.1 4.0 3.4*  CL 99 100  --  110 111  --   --  114* 113*  CO2 32 32  --  29 29  --   --  30 30  GLUCOSE 169* 251*  --  235* 98  --   --  114* 152*  BUN 82* 91*  --  68* 57*  --   --  67* 58*  CREATININE 2.33* 2.05*  --  1.42* 1.58*  --   --  2.28*  1.70*  CALCIUM 8.1* 8.2*  --  8.1* 9.1  --   --  8.7* 8.1*  MG  --   --  2.7*  --   --   --   --  2.5* 2.4  PROT 5.4* 6.1*  --  5.7*  --   --   --   --   --   ALBUMIN 2.4* 2.3*  --  2.1*  --   --   --   --   --   AST 34 23  --  17  --   --   --   --   --   ALT 100* 70*  --  51*  --   --   --   --   --   ALKPHOS 69 65  --  56  --   --   --   --   --   BILITOT 2.3* 1.7*  --  1.2*  --   --   --   --   --   GFRNONAA 30* 35*  --  54* 47*  --   --  30* 43*  ANIONGAP 12 11  --  10 13  --   --  9 9   < > = values in this interval not displayed.    Lipids No results for input(s): "CHOL", "TRIG", "HDL", "LABVLDL", "LDLCALC", "CHOLHDL" in the last 168 hours.  Hematology Recent Labs  Lab 08/01/23 0615 08/01/23 1652 08/01/23 1843 08/02/23 0620 08/03/23 0543  WBC 12.0*  --   --  11.8* 10.4  RBC 6.01*  --   --  5.25 5.03  HGB 15.2   < > 16.0 13.3 12.6*  HCT 52.2*   < > 47.0 45.8 44.0  MCV 86.9  --   --  87.2 87.5  MCH 25.3*  --   --  25.3* 25.0*  MCHC 29.1*  --   --  29.0* 28.6*  RDW 19.3*  --   --  19.1* 19.1*  PLT 160  --   --  164 181   < > = values in this interval not displayed.   Thyroid No results for  input(s): "TSH", "FREET4" in the last 168 hours.  BNP Recent Labs  Lab 07/30/23 0511 07/31/23 0430 08/01/23 0615  BNP 245.3* 329.6* 455.6*    DDimer No results for input(s): "DDIMER" in the last 168 hours.   Radiology    DG Chest Port 1 View  Result Date: 08/02/2023 CLINICAL DATA:  Acute respiratory failure.  Shortness of breath. EXAM: PORTABLE CHEST 1 VIEW COMPARISON:  Radiographs 08/01/2023 and 07/28/2023.  CT 07/24/2023. FINDINGS: 0653 hours. Two views submitted. Patient remains rotated to the right. Endotracheal tube is unchanged, terminating at the mid tracheal level. There is a stable left IJ central venous catheter directed laterally at the level of the mid SVC. Enteric tube projects into the right upper quadrant of the abdomen. Stable cardiac enlargement and superior mediastinal widening attributed to fat based on prior CT. No significant change in asymmetric left perihilar airspace disease. No evidence of significant pleural effusion or pneumothorax. IMPRESSION: 1. No significant change in asymmetric left perihilar airspace disease. 2. Stable support system. Electronically Signed   By: Carey Bullocks M.D.   On: 08/02/2023 10:43   DG Abd 1 View  Result Date: 08/01/2023 CLINICAL DATA:  NG tube placement. EXAM: ABDOMEN - 1 VIEW COMPARISON:  07/31/2023. FINDINGS: NG tube passes below the diaphragm into the distal stomach. Mild gaseous  distension the small bowel and colon. IMPRESSION: Well-positioned NG tube. Electronically Signed   By: Amie Portland M.D.   On: 08/01/2023 17:56   DG CHEST PORT 1 VIEW  Result Date: 08/01/2023 CLINICAL DATA:  Intubation. EXAM: PORTABLE CHEST 1 VIEW COMPARISON:  07/28/2023.  CT, 07/24/2023. FINDINGS: Endotracheal tube tip projects 1.5 cm above the carina. Nasal/orogastric tube passes below the diaphragm well into the stomach. Stable enlargement the cardiac silhouette. Hazy perihilar and lung base opacities are noted that are similar to the prior exam.  Bilateral pleural effusions suspected. No pneumothorax. IMPRESSION: 1. Well-positioned support apparatus. 2. No change in the appearance of the chest since the prior study. Perihilar and lung base opacities consistent with pulmonary edema and pleural effusions. Stable cardiomegaly. Electronically Signed   By: Amie Portland M.D.   On: 08/01/2023 17:55    Cardiac Studies     Patient Profile     67 y.o. male with morbid obesity we are consulted for new heart failure  Assessment & Plan    Acute combined heart failure: Echo 11/30 showed EF less than 20%, severe RV dysfunction.  Coox has been normal, no evidence of shock -Can fold in GDMT once able to take PO -Developed worsening AKI with diuresis, holding Lasix -Consider ischemic evaluation once recovered from acute illness  Acute respiratory failure with hypoxia/hypercapnia: Likely multifactorial with pulmonary edema and H. influenzae pneumonia, in addition to underlying OSA/OHS.  Reintubated 12/7 for aspiration pneumonia.  Extubated 12/9. Currently on ceftriaxone for H. influenzae pneumonia  Paroxysmal atrial fibrillation: On heparin drip, amiodarone.  Maintaining sinus rhythm  AKI: Renal function improving with holding diuresis, Cr 1.7 today  Hypernatremia: on free water flushes  Morbid obesity: Body mass index is 66.83 kg/m.  For questions or updates, please contact [facility] Please consult [email or web address] for contact info under        Signed, Little Ishikawa, MD  08/03/2023, 11:41 AM

## 2023-08-03 NOTE — Progress Notes (Signed)
eLink Physician-Brief Progress Note [name] DOB: 06-14-1956 MRN: [id number]   Date of Service  08/03/2023  HPI/Events of Note  Levi [name] was extubated today.  No on Bipap with 100% FIO2 with increased work of breathing.  ABG shows resp acidosis.  HR 130-150 in atrial fib.  BP elevated.  Received amiodarone bolus and is on amiodarone drip. He has very depressed EF in setting of morbid obesity.  eICU Interventions  Contacted ground team to evaluate resp failure.  May need intubation.     Intervention Category Major Interventions: Arrhythmia - evaluation and management;Respiratory failure - evaluation and management  Henry Russel, P 08/03/2023, 7:42 PM

## 2023-08-03 NOTE — Anesthesia Procedure Notes (Signed)
Procedure Name: Intubation Date/Time: 08/03/2023 8:25 PM  Performed by: Gaynelle Adu, MDPre-anesthesia Checklist: Patient identified, Emergency Drugs available, Suction available and Patient being monitored Patient Re-evaluated:Patient Re-evaluated prior to induction Oxygen Delivery Method: Circle system utilized Preoxygenation: Pre-oxygenation with 100% oxygen Ventilation: Mask ventilation with difficulty and Oral airway inserted - appropriate to patient size Laryngoscope Size: Glidescope and 4 Grade View: Grade IV Tube type: Oral Tube size: 8.5 mm Number of attempts: 2 Airway Equipment and Method: Oral airway, Video-laryngoscopy and Rigid stylet Placement Confirmation: ETT inserted through vocal cords under direct vision, positive ETCO2, breath sounds checked- equal and bilateral and CO2 detector Secured at: 26 cm Tube secured with: Tape Dental Injury: Bloody posterior oropharynx  Difficulty Due To: Difficulty was anticipated, Difficult Airway- due to large tongue, Difficult Airway- due to reduced neck mobility and Difficult Airway-  due to edematous airway Future Recommendations: Recommend- induction with short-acting agent, and alternative techniques readily available

## 2023-08-03 NOTE — Progress Notes (Addendum)
Nutrition Follow-up / Consult  DOCUMENTATION CODES:   Morbid obesity  INTERVENTION:   Trickle TF via Cortrak tube: Jevity 1.5 at 20 ml/h (480 ml per day) Prosource TF20 60 ml TID  Provides 960 kcal, 91 gm protein, 365 ml free water daily.  When able to advance tube feeding rate, recommend Jevity 1.5 at goal rate of 55 ml/h, continuing Prosource TF 20 60 ml TID to provide 2220 kcal, 144 gm protein, 1003 ml free water daily.  Free water 200 ml every 4 hours provides an additional 1200 ml free water per day. Recommend adjust free water as needed to keep normal sodium level.   NUTRITION DIAGNOSIS:   Increased nutrient needs related to chronic illness as evidenced by estimated needs.  Ongoing   GOAL:   Patient will meet greater than or equal to 90% of their needs  Progressing   MONITOR:   Labs, Weight trends, I & O's, Vent status  REASON FOR ASSESSMENT:   Consult, Ventilator Enteral/tube feeding initiation and management, Assessment of nutrition requirement/status  ASSESSMENT:   67 y.o. male with medical history significant of obesity. Presented with  hypoxia.  Patient presents with cough and shortness of breath since last week was given a dose of albuterol's found to be satting 64% on room air felt to have COPD exacerbation reports shortness of breath has been ongoing for at least 2 weeks.  12/6: Patient was extubated; OG tube was removed and TF stopped. Developed N/V that night; Bedside swallow eval with SLP-rec NPO with plans for FEES next date.  12/7: Recurrent vomiting and aspiration; required re-intubation; OG tube to LIS. 12/8: Reglan added x 6 doses; trickle TF initiated with Pivot 1.5 at 20 ml/h.  12/9: Extubated to BiPAP; OG tube removed; Cortrak ordered.   Discussed patient in ICU rounds and with RN today. MD would like to continue trickle tube feeds after Cortrak placed today, then can advance to goal rate tomorrow.   Patient is currently on 4 L oxygen via  [place].  Labs reviewed. Na 152, K 3.4, phos 2 CBG: 153-180  Medications reviewed and include pepcid, novolog, semglee, IV rocephin, IV potassium phosphate x 1. Colace and miralax not given this morning d/t diarrhea. Fecal management system in place.   TF: Pivot 1.5 at 20 ml/h with free water flushes 200 ml every 4 hours is providing 720 kcal, 45 gm protein, 1560 ml free water daily.  Admit weight: 206.4 kg Current weight: 193.6 kg  NUTRITION - FOCUSED PHYSICAL EXAM:  Flowsheet Row Most Recent Value  Orbital Region No depletion  Upper Arm Region No depletion  Thoracic and Lumbar Region No depletion  Buccal Region No depletion  Temple Region No depletion  Clavicle Bone Region No depletion  Clavicle and Acromion Bone Region No depletion  Scapular Bone Region No depletion  Dorsal Hand No depletion  Patellar Region No depletion  Anterior Thigh Region No depletion  Posterior Calf Region No depletion  Edema (RD Assessment) Mild  Hair Reviewed  Eyes Reviewed  Mouth Unable to assess  Skin Reviewed  Nails Reviewed       Diet Order:   Diet Order             Diet NPO time specified Except for: Sips with Meds  Diet effective now                   EDUCATION NEEDS:   No education needs have been identified at this time  Skin:  Skin  Assessment: Skin Integrity Issues: Skin Integrity Issues:: Other (Comment) Other: ID abdomen  Last BM:  12/8 fecal management system in place with 40 ml output x 24 hours  Height:   Ht Readings from Last 1 Encounters:  07/25/23 5' 7.01" (1.702 m)    Weight:   Wt Readings from Last 1 Encounters:  08/03/23 (!) 193.6 kg    BMI:  Body mass index is 66.83 kg/m.  Estimated Nutritional Needs:   Kcal:  2000-2300  Protein:  130-150 gm  Fluid:  2-2.3 L   Gabriel Rainwater RD, LDN, CNSC Please refer to Amion for contact information.

## 2023-08-03 NOTE — Progress Notes (Signed)
PHARMACY - ANTICOAGULATION  Pharmacy Consult for heparin Indication: atrial fibrillation  No Known Allergies  Patient Measurements: Height: 5' 7.01" (170.2 cm) Weight: (!) 193.6 kg (426 lb 13 oz) IBW/kg (Calculated) : 66.12 Heparin Dosing Weight: 117 kg  Vital Signs: Temp: 99.7 F (37.6 C) (12/09 2000) Temp Source: Axillary (12/09 2000) BP: 154/78 (12/09 2100) Pulse Rate: 93 (12/09 2100)  Labs: Recent Labs    08/01/23 0615 08/01/23 1652 08/02/23 0620 08/02/23 1515 08/02/23 1615 08/03/23 0035 08/03/23 0543 08/03/23 1050 08/03/23 1938 08/03/23 2110 08/03/23 2128  HGB 15.2   < > 13.3  --   --   --  12.6*  --  15.6 15.3  --   HCT 52.2*   < > 45.8  --   --   --  44.0  --  46.0 45.0  --   PLT 160  --  164  --   --   --  181  --   --   --   --   APTT  --   --   --   --   --   --   --   --   --   --  49*  HEPARINUNFRC 0.40  --  0.23*   < >  --  0.12*  --  0.11*  --   --  0.27*  CREATININE 1.58*  --  2.28*  --   --   --  1.70*  --   --   --   --   TROPONINIHS  --   --   --   --  362*  --   --   --   --   --   --    < > = values in this interval not displayed.    Estimated Creatinine Clearance: 68.9 mL/min (A) (by C-G formula based on SCr of 1.7 mg/dL (H)).  Assessment: 67 y.o. male with Afib. Pharmacy consulted for heparin infusion. No anticoagulants prior to admission  HL 0.27 - subtherapeutic but significantly increased from this AM after rate increased to 2700 units/hr. aPTT 49 (subtherapeutic) D/w RN, heparin running through CVC. Prior to lab draw, infusion was paused then flushed, then lab drawn.   Some bloody secretions reported after emergent intubation - Elink made aware - d/w Dr. Henry Russel, will continue heparin at same and f/u AM labs and patient's clinical status.   Goal of Therapy:  Heparin level 0.3-0.7 units/ml Monitor platelets by anticoagulation protocol: Yes   Plan:  Continue heparin drip at 2700 units/hr   Monitor daily heparin level, CBC,  signs/symptoms of bleeding  F/u long term anticoagulation plan  Loralee Pacas, PharmD, BCPS Clinical Pharmacist 08/03/2023 10:10 PM

## 2023-08-03 NOTE — Progress Notes (Signed)
eLink Physician-Brief Progress Note [name] DOB: Jan 24, 1956 MRN: [id number]   Date of Service  08/03/2023  HPI/Events of Note  Bloody resp secretions noted with 150 cc in suction cannister.  Heparin drip to be increased now per protocol.  eICU Interventions  Discussed with pharmacy. Continue heparin drip but do not increase at this time.  Readdress increase with morning lab.     Intervention Category Intermediate Interventions: Bleeding - evaluation and treatment with blood products  Henry Russel, P 08/03/2023, 11:14 PM

## 2023-08-03 NOTE — Procedures (Signed)
Intubation Procedure Note  STONE WATRING  [id number]  [birth year]  Date:08/03/23  Time:8:45 PM   Provider Performing:Dayanara Sherrill Kathie Rhodes Celine Mans    Procedure: Intubation (31500)  Indication(s) Respiratory Failure  Consent Unable to obtain consent due to emergent nature of procedure.   Anesthesia Etomidate and Rocuronium   Time Out Verified patient identification, verified procedure, site/side was marked, verified correct patient position, special equipment/implants available, medications/allergies/relevant history reviewed, required imaging and test results available.   Sterile Technique Usual hand hygeine, masks, and gloves were used   Procedure Description Patient positioned in bed supine.  Sedation given as noted above.  Patient was intubated with endotracheal tube using Glidescope.  View was Grade 4 no glottis structures visible.  Number of attempts was  2 .  Colorimetric CO2 detector was consistent with tracheal placement. I initially placed the ETT with good color change and bilateral breath sounds. After tube was secured and patient connected to ventilator, there was subsequent destaturation and audible cuff leak as well as cuff leak on ventilator. On further examination the ETT was out. I subsequently bagged the patient up and attempted to reintubation however there was copious blood and redundant tissue in the airway and I was unable to pass tube despite visualization of the epiglottis. I did call for anesthesia who was able to intubation the patient after 2 attempts (see separate procedure note>)   Complications/Tolerance Difficult airway Chest X-ray is ordered to verify placement.   EBL 20cc   Specimen(s) None   Durel Salts, MD Pulmonary and Critical Care Medicine [facility] 08/03/2023 8:49 PM Pager: see AMION  If no response to pager, please call critical care on call (see AMION) until 7pm After 7:00 pm call Elink

## 2023-08-03 NOTE — Progress Notes (Signed)
Wasted 7 mL of fentanyl infusion from old tubing with Aline Brochure RN.

## 2023-08-03 NOTE — Progress Notes (Signed)
Physical Therapy Treatment Patient Details Name: Levi [name] MRN: [id number] DOB: Jun 29, 1956 Today's Date: 08/03/2023   History of Present Illness Pt is a 67 y.o. male who presented 07/24/23 with dry cough, SOB, lower extremity edema, orthopnea for 1-2 weeks. At home was hypoxic with oxygen saturations 64% on room air. Admitted with acute biventricular heart failure, new onset of afib, AKI, bil multifocal PNA, and acute metabolic encephalopathy. ETT 11/30 - 12/6. During admission, noted to have myoclonus likely due to uremia, amiodarone. PMH: morbid obesity, probably untreated OSA/OHS, and knee OA    PT Comments  Pt is slower to progress toward goals, limited by body habitus and need for extra assist.  Emphasis on monitoring, warm up, transition to EOB with stress on scooting and sitting balance at EOB.   If plan is discharge home, recommend the following: Two people to help with walking and/or transfers;Two people to help with bathing/dressing/bathroom;Assistance with cooking/housework;Direct supervision/assist for medications management;Direct supervision/assist for financial management;Assist for transportation;Help with stairs or ramp for entrance   Can travel by private vehicle        Equipment Recommendations  BSC/3in1;Wheelchair (measurements PT);Wheelchair cushion (measurements PT);Hospital bed;Hoyer lift;Rolling walker (2 wheels)    Recommendations for Other Services Rehab consult     Precautions / Restrictions Precautions Precautions: Fall Precaution Comments: flexiseal, morbid obesity, cortrak Restrictions Weight Bearing Restrictions: No     Mobility  Bed Mobility Overal bed mobility: Needs Assistance Bed Mobility: Supine to Sit, Sit to Supine     Supine to sit: +2 for physical assistance, Total assist Sit to supine: +2 for physical assistance, Total assist   General bed mobility comments: Pt needed total assist for all aspects of supine to sit and sit to supine.     Transfers                   General transfer comment: Not attempted secondary to safety issues.    Ambulation/Gait               General Gait Details: deferred due to pt needing heavy assist for sitting balance and pt confused, felt it was unsafe to attempt without +3 assist at this time   Stairs             Wheelchair Mobility     Tilt Bed    Modified Rankin (Stroke Patients Only) Modified Rankin (Stroke Patients Only) Pre-Morbid Rankin Score: No symptoms Modified Rankin: Severe disability     Balance Overall balance assessment: Needs assistance Sitting-balance support: Bilateral upper extremity supported, Feet supported Sitting balance-Leahy Scale: Poor Sitting balance - Comments: Pt with posterior lean, needing  maxA for static sitting balance ~10 mins                                    Cognition Arousal: Alert Behavior During Therapy: Flat affect Overall Cognitive Status: No family/caregiver present to determine baseline cognitive functioning Area of Impairment: Orientation, Attention, Problem solving, Awareness, Memory, Following commands, Safety/judgement                 Orientation Level: Disoriented to, Place, Time, Situation Current Attention Level: Focused Memory: Decreased short-term memory Following Commands: Follows one step commands inconsistently Safety/Judgement: Decreased awareness of safety, Decreased awareness of deficits Awareness: Intellectual Problem Solving: Slow processing, Difficulty sequencing, Requires verbal cues General Comments: Pt with decreased inteligibility throughout along with increased air flow noise in the room.  Pt would mumble words when asked questions in response.        Exercises      General Comments        Pertinent Vitals/Pain Pain Assessment Pain Assessment: Faces Pain Location: legs with mobility, touching L foot. Pain Descriptors / Indicators: Discomfort,  Grimacing, Guarding Pain Intervention(s): Monitored during session, Limited activity within patient's tolerance    Home Living Family/patient expects to be discharged to:: Private residence Living Arrangements: Spouse/significant other Available Help at Discharge: Family;Available 24 hours/day Type of Home: House Home Access: Stairs to enter   [employer] of Steps: 3   Home Layout: One level Home Equipment: None Additional Comments: pt confused and may be a poor historian so unsure of accuracy of info above    Prior Function            PT Goals (current goals can now be found in the care plan section) Acute Rehab PT Goals Patient Stated Goal: to improve PT Goal Formulation: With patient Time For Goal Achievement: 08/15/23 Potential to Achieve Goals: Good Progress towards PT goals: Progressing toward goals    Frequency    Min 1X/week      PT Plan      Co-evaluation PT/OT/SLP Co-Evaluation/Treatment: Yes Reason for Co-Treatment: For patient/therapist safety PT goals addressed during session: Mobility/safety with mobility        AM-PAC PT "6 Clicks" Mobility   Outcome Measure  Help needed turning from your back to your side while in a flat bed without using bedrails?: A Lot Help needed moving from lying on your back to sitting on the side of a flat bed without using bedrails?: Total Help needed moving to and from a bed to a chair (including a wheelchair)?: Total Help needed standing up from a chair using your arms (e.g., wheelchair or bedside chair)?: Total Help needed to walk in hospital room?: Total Help needed climbing 3-5 steps with a railing? : Total 6 Click Score: 7    End of Session Equipment Utilized During Treatment: Oxygen Activity Tolerance: Other (comment) Patient left: in bed;with call bell/phone within reach Nurse Communication: Mobility status PT Visit Diagnosis: Muscle weakness (generalized) (M62.81);Difficulty in walking, not  elsewhere classified (R26.2)     Time: 1308-6578 PT Time Calculation (min) (ACUTE ONLY): 32 min  Charges:    $Therapeutic Activity: 8-22 mins PT General Charges $$ ACUTE PT VISIT: 1 Visit                     08/03/2023  Jacinto Halim., PT Acute Rehabilitation Services [phone]  (office)   Levi [name] 08/03/2023, 5:50 PM

## 2023-08-03 NOTE — Procedures (Signed)
Extubation Procedure Note  Patient Details:   Name: Levi [name] DOB: [birth year] MRN: [id number]   Airway Documentation:    Vent end date: 08/03/23 Vent end time: 1012   Evaluation  O2 sats: stable throughout Complications: No apparent complications Patient did tolerate procedure well. Bilateral Breath Sounds: Diminished   Yes, pt able to cough to clear secretions. RT placed pt on BiPAP at this time post extubation. Pt tolerating well at this time. Pt tolerated procedure well, no vomiting at this time. Positive for cuff leak prior to extubation  Tacy Learn 08/03/2023, 10:24 AM

## 2023-08-03 NOTE — Progress Notes (Signed)
Upon receiving report of patient, patient displayed signs of tachypnea, tachycardia, and use of accessory muscles to breathing despite being on BiPAP. Contacted Elink about obtaining an ABG to determined patient's oxygenation status (See 1938 ABG Results). Discussion of ability of patient to sustain increased work of breathing while on BiBAP and being tachycardia in 150's. CCM ground team notified via Elink and now at bedside for intubation.  Delcie Roch, RN

## 2023-08-03 NOTE — Progress Notes (Addendum)
RT received call from charge RN pt was desatting at 1825. RT was in the progressive unit next door and came immediately. Upon arrival pt was cyanotic on 15L [place] with SpO2 in the high 40's and HR in the upper 40's and unresponsive. Pt immediately placed on BiPAP 100% to improve SpO2. Pt SpO2 now In the high 90's and HR in the 150's. Pt currently opening eyes and responsive now, but not fully responsive as he was prior to event. MD aware of the situation and vitals and pt not responsive as he was earlier. RT will continue to monitor.

## 2023-08-03 NOTE — Progress Notes (Signed)
NAME:  Levi [name], MRN:  [id number], DOB:  08/22/1956, LOS: 10 ADMISSION DATE:  07/24/2023, CONSULTATION DATE: 07/24/2023  REFERRING MD: Marlene Bast, CHIEF COMPLAINT: Shortness of breath and hypoxia  History of Present Illness:  67 year old man with morbid obesity, probably untreated OSA/OHS and knee OA who presented to ED with dry cough, DOE, SOB, LL edema, orthopnea for 1-2 weeks. Home health RN found his SpO2 64% on RA. Given a dose of Albuterol. Denied wheezing, CP, N/V/D, abd pain, f/c/r, and rash. Not on home O2 or NIV.  No smoking, illicit drug use, or alcoholism.  No hx of COPD, asthma, VTE, cardiac hx, liver disease, or CKD Developed runs of VT in ED. Admitted with biventricular failure , H. influenzae pneumonia.  Course complicated by seizures and aspiration episode  Pertinent Medical History:  History reviewed. No pertinent past medical history.  Significant Hospital Events: Including procedures, antibiotic start and stop dates in addition to other pertinent events   11/29 Admitted to [facility] 12/3 Trach aspirate sent for thick secretions. Amiodarone started for AF w/ bigeminy and trigeminy. New onset rhythmic twitching left face, LLE. Resolved spontaneously. 12/4 Seen by Neuro: myoclonic jerks noted on [employer] monitor but not able to distinguish if seizure activity. Transferred to [facility] for LTM. Started Keppra.  Febrile. Sats 88%. LEUS negative for DVT. LTM neg for seizure so Keppra stopped later that day.  Had been started on vanc and zosyn 12/3 for fever CT was neg for stroke. Vanc stopped 12/5 sputum H flu BL positive. Change to CTX. More awake. Trying to wean sedation to assess weaning ability. Renal fxn improved after holding Lasix. 12/6 extubated , BiPAP DC'd due to vomiting 12/7 recurrent vomiting with aspiration >> reintubated  Interim History / Subjective:   Awake alert following commands.  Passed SBT with tidal volume 500 cc respiratory rate around 20.  He is making  liquid stool with FMS in place.  Objective:  Blood pressure 131/68, pulse 85, temperature 99.2 F (37.3 C), temperature source Axillary, resp. rate (!) 24, height 5' 7.01" (1.702 m), weight (!) 193.6 kg, SpO2 94%. CVP:  [12 mmHg-26 mmHg] 20 mmHg  Vent Mode: PSV;CPAP FiO2 (%):  [40 %] 40 % Set Rate:  [24 bmp] 24 bmp Vt Set:  [520 mL] 520 mL PEEP:  [5 cmH20-8 cmH20] 5 cmH20 Pressure Support:  [8 cmH20] 8 cmH20 Plateau Pressure:  [21 cmH20-26 cmH20] 23 cmH20   Intake/Output Summary (Last 24 hours) at 08/03/2023 1014 Last data filed at 08/03/2023 0700 Gross per 24 hour  Intake 3163.03 ml  Output 1550 ml  Net 1613.03 ml   Filed Weights   08/01/23 0500 08/02/23 0500 08/03/23 0500  Weight: (!) 190.1 kg (!) 193 kg (!) 193.6 kg   Physical Examination: General: Acute-on-chronically ill-appearing morbidly obese middle-aged man in NAD.  HEENT: /AT, anicteric sclera, PERRL, moist mucous membranes. Neuro: Awake, off sedation, RASS +1, CV: S1-S2 regular, distant PULM: Bilateral distant ventilated breath sounds, no accessory muscle use GI: Obese, soft, nontender, nondistended. Hypoactive bowel sounds. Extremities: Bilateral symmetric 1+ chronic LE edema noted with ruddy erythema overlying. Skin: Warm/dry, no rashes.  Labs and images reviewed  Assessment & Plan:   Acute-on-chronic systolic heart failure Echo from 11/30 with EF <20%, G1DD, severely reduced RVSF, severely enlarged RV - Cardiology following, appreciate assistance; considering R/LHC when more stable - Cardiac monitoring/tele  Acute Hypoxic and Hypercapnic respiratory failure -secondary to pulmonary edema and complicated further by BL positive Haemophilus influenza PNA (12/3) ,  reintubated 12/7 for aspiration pneumonia Presumed OSA/OHS - initially Zosyn x 3 days transitioned to ceftriaxone , plan for 7 total days from aspiration event 12/7 -Plan for extubation status post appropriate SBT -Extubate to BiPAP and BiPAP  nocturnal  Ileus/vomiting: Possible ileus on abdominal film -Reglan for 6 doses -- Adequate stooling now with FMS in place  New onset AF w/ intermittent ventricular ectopy, CHA2DS2/VASC score 2.  Bigeminy/trigeminy VT Started on amio and heparin 12/3.  - Cardiac monitoring - Optimize electrolytes as able  Acute metabolic encephalopathy  Improved.  Myoclonic jerks attributed to uremia and sedation Possible seizure. Non witnessed by LTM. Felt metabolic in nature. CT head obtained and it was reassuring w/out new clear stroke/injury - Monitor off of AEDs - Keppra discontinued 12/4  Acute kidney injury w/ rising Cr in context of on-going diuresis  Renal fxn improved w/ holding lasix. - Trend BMP, Cr improving - Replete electrolytes as indicated - Monitor I&Os, goal euvolemia - Avoid nephrotoxic agents as able  Hypernatremia  -free water via TF -D5 d/c'd 12/9 given CM w/ reduced EF  Congestive hepatopathy, improving - Supportive care - Intermittent LFT monitoring with amio  Uncontrolled diabetes, now hypoglycemic while n.p.o. - SSI - Goal CBG 140-180 - Semglee 8 units daily  Morbid Obesity - Counseling re: weight loss/lifestyle changes  Best Practice: (right click and "Reselect all SmartList Selections" daily)   Diet/type: tubefeeds DVT prophylaxis: IV heparin started 12/3   Pressure ulcer(s): not present on admission  GI prophylaxis: H2B Lines: Central line Foley:  Yes, and it is still needed Code Status:  full code Last date of multidisciplinary goals of care discussion [n/a]  Critical care time:    CRITICAL CARE Performed by: Karren Burly   Total critical care time: 35 minutes  Critical care time was exclusive of separately billable procedures and treating other patients.  Critical care was necessary to treat or prevent imminent or life-threatening deterioration.  Critical care was time spent personally by me on the following activities: development  of treatment plan with patient and/or surrogate as well as nursing, discussions with consultants, evaluation of patient's response to treatment, examination of patient, obtaining history from patient or surrogate, ordering and performing treatments and interventions, ordering and review of laboratory studies, ordering and review of radiographic studies, pulse oximetry and re-evaluation of patient's condition. Karren Burly, MD [facility] 08/03/23 10:14 AM  Please see [email or web address] for pager details.  From 7A-7P if no response, please call [phone] After hours, please call ELink [phone]

## 2023-08-03 NOTE — Progress Notes (Signed)
Pt was intubated for respiratory distress difficult airway

## 2023-08-03 NOTE — Evaluation (Signed)
Occupational Therapy Evaluation Patient Details Name: Levi [name] MRN: [id number] DOB: 1956/07/14 Today's Date: 08/03/2023   History of Present Illness Pt is a 67 y.o. male who presented 07/24/23 with dry cough, SOB, lower extremity edema, orthopnea for 1-2 weeks. At home was hypoxic with oxygen saturations 64% on room air. Admitted with acute biventricular heart failure, new onset of afib, AKI, bil multifocal PNA, and acute metabolic encephalopathy. ETT 11/30 - 12/6. During admission, noted to have myoclonus likely due to uremia, amiodarone. PMH: morbid obesity, probably untreated OSA/OHS, and knee OA   Clinical Impression   Pt currently not oriented to place, time, or situation and not consistently following one step commands (about 25% currently).  He needs total +2 for supine to sit with max assist for sitting balance secondary to posterior lean.  Speech is soft and difficult to understand overall when he responds.  Pt lives with his spouse and family and per conversation with PT a few days ago, he reports being independent with ADLs.  Feel currently he is severely limited with all aspects of selfcare, cognition, safety, endurance, and balance.  Recommend acute care OT to help reduce burden of care and increase active participation and sustained attention for basic ADLs.  Recommend patient will benefit from intensive inpatient follow up therapy, >3 hours/day post acute stay if family can provide safe assist at discharge.              If plan is discharge home, recommend the following: Two people to help with bathing/dressing/bathroom;Two people to help with walking and/or transfers;Help with stairs or ramp for entrance;Direct supervision/assist for financial management;Direct supervision/assist for medications management;Assist for transportation;Supervision due to cognitive status;Assistance with cooking/housework    Functional Status Assessment  Patient has had a recent decline in their  functional status and demonstrates the ability to make significant improvements in function in a reasonable and predictable amount of time.  Equipment Recommendations  Other (comment) (TBD next venue of care)       Precautions / Restrictions Precautions Precautions: Fall Precaution Comments: flexiseal, morbid obesity, cortrak Restrictions Weight Bearing Restrictions: No      Mobility Bed Mobility Overal bed mobility: Needs Assistance Bed Mobility: Supine to Sit, Sit to Supine     Supine to sit: +2 for physical assistance, Total assist Sit to supine: +2 for physical assistance, Total assist   General bed mobility comments: Pt needed total assist for all aspects of supine to sit and sit to supine.    Transfers                   General transfer comment: Not attempted secondary to safety issues.      Balance Overall balance assessment: Needs assistance Sitting-balance support: Bilateral upper extremity supported, Feet supported Sitting balance-Leahy Scale: Poor Sitting balance - Comments: Pt with posterior lean, needing  maxA for static sitting balance ~10 mins                                   ADL either performed or assessed with clinical judgement   ADL Overall ADL's : Needs assistance/impaired Eating/Feeding: NPO Eating/Feeding Details (indicate cue type and reason): cortrak just placed Grooming: Wash/dry face;Sitting;Maximal assistance Grooming Details (indicate cue type and reason): for inititation sitting EOB Upper Body Bathing: Total assistance;Sitting Upper Body Bathing Details (indicate cue type and reason): EOB Lower Body Bathing: Total assistance;+2 for physical assistance;Bed level  Functional mobility during ADLs: +2 for physical assistance;Total assistance;+2 for safety/equipment (supine to sit EOB) General ADL Comments: Pt currently needing total +2 for supine to sit.  Oxygen sats at 92% on 15 Ls high  flow nasal cannula.  HR maintained in the mid 90s with BP at 144/81.  Pt sat EOB for approximately 10 mins with overall max assist to maintain sitting balance.     Vision Ability to See in Adequate Light: 0 Adequate Additional Comments: unable to determine secondary to attention and cognition.  When asked to follow therapist's fingers, he was unable to complete.  Will continue to assess in treatment.     Perception Perception: Within Functional Limits       Praxis Praxis: WFL       Pertinent Vitals/Pain Pain Assessment Pain Assessment: Faces Faces Pain Scale: Hurts little more Pain Location: legs with mobility Pain Descriptors / Indicators: Discomfort, Grimacing, Guarding Pain Intervention(s): Limited activity within patient's tolerance, Monitored during session     Extremity/Trunk Assessment Upper Extremity Assessment Upper Extremity Assessment:  (Pt able to lift BUEs spontaneously and also to command inconsistently.  Grip limited secondary to decreased sustained attention.  Unable to complete MMT.)   Lower Extremity Assessment Lower Extremity Assessment: Defer to PT evaluation   Cervical / Trunk Assessment Cervical / Trunk Assessment: Other exceptions Cervical / Trunk Exceptions: increased body habitus   Communication Communication Communication: Difficulty communicating thoughts/reduced clarity of speech;Difficulty following commands/understanding   Cognition Arousal: Alert Behavior During Therapy: Flat affect Overall Cognitive Status: No family/caregiver present to determine baseline cognitive functioning Area of Impairment: Orientation, Attention, Problem solving, Awareness, Memory, Following commands, Safety/judgement                 Orientation Level: Disoriented to, Place, Time, Situation Current Attention Level: Focused Memory: Decreased short-term memory Following Commands: Follows one step commands inconsistently Safety/Judgement: Decreased awareness of  safety, Decreased awareness of deficits Awareness: Intellectual Problem Solving: Slow processing, Difficulty sequencing, Requires verbal cues General Comments: Pt with decreased inteligibility throughout along with increased air flow noise in the room.  Pt would mumble words when asked questions in response.                Home Living Family/patient expects to be discharged to:: Private residence Living Arrangements: Spouse/significant other Available Help at Discharge: Family;Available 24 hours/day Type of Home: House Home Access: Stairs to enter [employer] of Steps: 3   Home Layout: One level     Bathroom Shower/Tub: Producer, television/film/video: Handicapped height     Home Equipment: None   Additional Comments: pt confused and may be a poor historian so unsure of accuracy of info above      Prior Functioning/Environment Prior Level of Function : Independent/Modified Independent;Patient poor historian/Family not available             Mobility Comments: No AD per pt          OT Problem List: Decreased strength;Decreased activity tolerance;Decreased knowledge of use of DME or AE;Decreased knowledge of precautions;Decreased cognition;Impaired balance (sitting and/or standing);Pain;Cardiopulmonary status limiting activity;Obesity      OT Treatment/Interventions: Self-care/ADL training;DME and/or AE instruction;Therapeutic activities;Balance training;Patient/family education;Therapeutic exercise;Neuromuscular education    OT Goals(Current goals can be found in the care plan section) Acute Rehab OT Goals Patient Stated Goal: Pt unable to state OT Goal Formulation: Patient unable to participate in goal setting Time For Goal Achievement: 08/17/23 Potential to Achieve Goals: Fair  OT Frequency: Min 1X/week  AM-PAC OT "6 Clicks" Daily Activity     Outcome Measure Help from another person eating meals?: Total Help from another person taking  care of personal grooming?: Total Help from another person toileting, which includes using toliet, bedpan, or urinal?: Total Help from another person bathing (including washing, rinsing, drying)?: Total Help from another person to put on and taking off regular upper body clothing?: Total Help from another person to put on and taking off regular lower body clothing?: Total 6 Click Score: 6   End of Session Equipment Utilized During Treatment: Oxygen  Activity Tolerance: Other (comment) (limited secondary to cognition.) Patient left: in bed;with call bell/phone within reach;Other (comment) (dieticians in the room to place Cortrak)  OT Visit Diagnosis: Unsteadiness on feet (R26.81);Muscle weakness (generalized) (M62.81);Other abnormalities of gait and mobility (R26.89);Pain;Other symptoms and signs involving cognitive function;Cognitive communication deficit (R41.841) Pain - Right/Left: Left Pain - part of body: Ankle and joints of foot                Time: 1405-1440 OT Time Calculation (min): 35 min Charges:  OT General Charges $OT Visit: 1 Visit OT Evaluation $OT Eval High Complexity: 1 High  Perrin Maltese, OTR/L Acute Rehabilitation Services  Office [phone] 08/03/2023

## 2023-08-03 NOTE — Progress Notes (Signed)
PHARMACY - ANTICOAGULATION  Pharmacy Consult for heparin Indication: atrial fibrillation  No Known Allergies  Patient Measurements: Height: 5' 7.01" (170.2 cm) Weight: (!) 193.6 kg (426 lb 13 oz) IBW/kg (Calculated) : 66.12 Heparin Dosing Weight: 117 kg  Vital Signs: Temp: 99.2 F (37.3 C) (12/09 0800) Temp Source: Axillary (12/09 0800) BP: 148/78 (12/09 1100) Pulse Rate: 86 (12/09 1100)  Labs: Recent Labs    08/01/23 0615 08/01/23 1652 08/01/23 1843 08/02/23 0620 08/02/23 1515 08/02/23 1615 08/03/23 0035 08/03/23 0543 08/03/23 1050  HGB 15.2   < > 16.0 13.3  --   --   --  12.6*  --   HCT 52.2*   < > 47.0 45.8  --   --   --  44.0  --   PLT 160  --   --  164  --   --   --  181  --   HEPARINUNFRC 0.40  --   --  0.23* <0.10*  --  0.12*  --  0.11*  CREATININE 1.58*  --   --  2.28*  --   --   --  1.70*  --   TROPONINIHS  --   --   --   --   --  362*  --   --   --    < > = values in this interval not displayed.    Estimated Creatinine Clearance: 68.9 mL/min (A) (by C-G formula based on SCr of 1.7 mg/dL (H)).  Assessment: 67 y.o. male with Afib. Pharmacy consulted for heparin infusion. No anticoagulants prior to admission  HL 0.11 - subtherapeutic D/w RN this AM, heparin running through CVC. Prior to lab draw, infusion was paused x70mins (charted off 1041 then restarted 1055) then flushed, then lab drawn. No issues with line infusing or kinking per day shift RN.   Hgb 12.6, Plt 181 - stable  Goal of Therapy:  Heparin level 0.3-0.7 units/ml Monitor platelets by anticoagulation protocol: Yes   Plan:  Increase heparin to 2700 units/hr   F/u 8hr level, will also obtain aPTT to ensure clinical correlation in setting of obesity Monitor daily heparin level, CBC, signs/symptoms of bleeding  F/u long term anticoagulation plan  Calton Dach, PharmD, BCCCP Clinical Pharmacist 08/03/2023 12:06 PM

## 2023-08-04 DIAGNOSIS — N179 Acute kidney failure, unspecified: Secondary | ICD-10-CM | POA: Diagnosis not present

## 2023-08-04 DIAGNOSIS — J9601 Acute respiratory failure with hypoxia: Secondary | ICD-10-CM | POA: Diagnosis not present

## 2023-08-04 DIAGNOSIS — I5041 Acute combined systolic (congestive) and diastolic (congestive) heart failure: Secondary | ICD-10-CM | POA: Diagnosis not present

## 2023-08-04 LAB — CBC
HCT: 42 % (ref 39.0–52.0)
Hemoglobin: 12.2 g/dL — ABNORMAL LOW (ref 13.0–17.0)
MCH: 25.2 pg — ABNORMAL LOW (ref 26.0–34.0)
MCHC: 29 g/dL — ABNORMAL LOW (ref 30.0–36.0)
MCV: 86.6 fL (ref 80.0–100.0)
Platelets: 144 10*3/uL — ABNORMAL LOW (ref 150–400)
RBC: 4.85 MIL/uL (ref 4.22–5.81)
RDW: 18.8 % — ABNORMAL HIGH (ref 11.5–15.5)
WBC: 7.8 10*3/uL (ref 4.0–10.5)
nRBC: 0 % (ref 0.0–0.2)

## 2023-08-04 LAB — BASIC METABOLIC PANEL
Anion gap: 9 (ref 5–15)
BUN: 44 mg/dL — ABNORMAL HIGH (ref 8–23)
CO2: 31 mmol/L (ref 22–32)
Calcium: 8.7 mg/dL — ABNORMAL LOW (ref 8.9–10.3)
Chloride: 111 mmol/L (ref 98–111)
Creatinine, Ser: 1.46 mg/dL — ABNORMAL HIGH (ref 0.61–1.24)
GFR, Estimated: 52 mL/min — ABNORMAL LOW (ref >60)
Glucose, Bld: 116 mg/dL — ABNORMAL HIGH (ref 70–99)
Potassium: 3.3 mmol/L — ABNORMAL LOW (ref 3.5–5.1)
Sodium: 151 mmol/L — ABNORMAL HIGH (ref 135–145)

## 2023-08-04 LAB — GLUCOSE, CAPILLARY
Glucose-Capillary: 113 mg/dL — ABNORMAL HIGH (ref 70–99)
Glucose-Capillary: 102 mg/dL — ABNORMAL HIGH (ref 70–99)
Glucose-Capillary: 119 mg/dL — ABNORMAL HIGH (ref 70–99)
Glucose-Capillary: 111 mg/dL — ABNORMAL HIGH (ref 70–99)
Glucose-Capillary: 127 mg/dL — ABNORMAL HIGH (ref 70–99)
Glucose-Capillary: 137 mg/dL — ABNORMAL HIGH (ref 70–99)

## 2023-08-04 LAB — TRIGLYCERIDES: Triglycerides: 137 mg/dL (ref <150)

## 2023-08-04 LAB — HEPARIN LEVEL (UNFRACTIONATED): Heparin Unfractionated: 0.17 [IU]/mL — ABNORMAL LOW (ref 0.30–0.70)

## 2023-08-04 MED ORDER — VITAL 1.5 CAL PO LIQD
1000.0000 mL | ORAL | Status: DC
  Administered 2023-08-04 – 2023-08-07 (×4): 1000 mL

## 2023-08-04 MED ORDER — PROSOURCE TF20 ENFIT COMPATIBL EN LIQD: 60.0000 mL | Freq: Two times a day (BID) | ENTERAL | Status: DC

## 2023-08-04 MED ORDER — VITAL AF 1.2 CAL PO LIQD
1000.0000 mL | ORAL | Status: DC
  Filled 2023-08-04: qty 1000

## 2023-08-04 MED ORDER — POTASSIUM CHLORIDE 20 MEQ PO PACK
40.0000 meq | PACK | Freq: Once | ORAL | Status: AC
  Administered 2023-08-04: 40 meq
  Filled 2023-08-04: qty 2

## 2023-08-04 MED ORDER — ORAL CARE MOUTH RINSE: 15.0000 mL | OROMUCOSAL | Status: DC | PRN

## 2023-08-04 MED ORDER — PROSOURCE TF20 ENFIT COMPATIBL EN LIQD
60.0000 mL | Freq: Four times a day (QID) | ENTERAL | Status: DC
  Administered 2023-08-04 – 2023-08-08 (×16): 60 mL
  Filled 2023-08-04 (×15): qty 60

## 2023-08-04 MED ORDER — FUROSEMIDE 10 MG/ML IJ SOLN
80.0000 mg | Freq: Once | INTRAMUSCULAR | Status: AC
  Administered 2023-08-04: 80 mg via INTRAVENOUS
  Filled 2023-08-04: qty 8

## 2023-08-04 MED ORDER — FREE WATER
200.0000 mL | Status: DC
  Administered 2023-08-04 – 2023-08-06 (×23): 200 mL

## 2023-08-04 MED ORDER — ORAL CARE MOUTH RINSE
15.0000 mL | OROMUCOSAL | Status: DC
  Administered 2023-08-04 – 2023-08-08 (×49): 15 mL via OROMUCOSAL

## 2023-08-04 NOTE — Progress Notes (Signed)
NAME:  Levi [name], MRN:  [id number], DOB:  01/18/1956, LOS: 11 ADMISSION DATE:  07/24/2023, CONSULTATION DATE: 07/24/2023  REFERRING MD: Marlene Bast, CHIEF COMPLAINT: Shortness of breath and hypoxia  History of Present Illness:  67 year old man with morbid obesity, probably untreated OSA/OHS and knee OA who presented to ED with dry cough, DOE, SOB, LL edema, orthopnea for 1-2 weeks. Home health RN found his SpO2 64% on RA. Given a dose of Albuterol. Denied wheezing, CP, N/V/D, abd pain, f/c/r, and rash. Not on home O2 or NIV.  No smoking, illicit drug use, or alcoholism.  No hx of COPD, asthma, VTE, cardiac hx, liver disease, or CKD Developed runs of VT in ED. Admitted with biventricular failure , H. influenzae pneumonia.  Course complicated by seizures and aspiration episode  Pertinent Medical History:  History reviewed. No pertinent past medical history.  Significant Hospital Events: Including procedures, antibiotic start and stop dates in addition to other pertinent events   11/29 Admitted to [facility] 12/3 Trach aspirate sent for thick secretions. Amiodarone started for AF w/ bigeminy and trigeminy. New onset rhythmic twitching left face, LLE. Resolved spontaneously. 12/4 Seen by Neuro: myoclonic jerks noted on Ceribell monitor but not able to distinguish if seizure activity. Transferred to [facility] for LTM. Started Keppra.  Febrile. Sats 88%. LEUS negative for DVT. LTM neg for seizure so Keppra stopped later that day.  Had been started on vanc and zosyn 12/3 for fever CT was neg for stroke. Vanc stopped 12/5 sputum H flu BL positive. Change to CTX. More awake. Trying to wean sedation to assess weaning ability. Renal fxn improved after holding Lasix. 12/6 extubated , BiPAP DC'd due to vomiting 12/7 recurrent vomiting with aspiration >> reintubated 12/9 extubated, doing well.  Later in the day fell asleep while on his back became encephalopathic desaturated with SBT reintubated due to  hypoxemia and hypercarbia on blood gas  Interim History / Subjective:   Re-intubated overnight   Objective:  Blood pressure 127/64, pulse 69, temperature 99.1 F (37.3 C), temperature source Axillary, resp. rate (!) 24, height 5' 7.01" (1.702 m), weight (!) 194.5 kg, SpO2 100%. CVP:  [10 mmHg-25 mmHg] 11 mmHg  Vent Mode: PRVC FiO2 (%):  [40 %-100 %] 60 % Set Rate:  [24 bmp] 24 bmp Vt Set:  [530 mL] 530 mL PEEP:  [12 cmH20] 12 cmH20 Plateau Pressure:  [25 cmH20-30 cmH20] 25 cmH20   Intake/Output Summary (Last 24 hours) at 08/04/2023 0831 Last data filed at 08/04/2023 0800 Gross per 24 hour  Intake 2553.4 ml  Output 1235 ml  Net 1318.4 ml   Filed Weights   08/02/23 0500 08/03/23 0500 08/04/23 0500  Weight: (!) 193 kg (!) 193.6 kg (!) 194.5 kg   Physical Examination: General: Acute-on-chronically ill-appearing morbidly obese middle-aged man in NAD.  HEENT: [place]/AT, anicteric sclera, PERRL, moist mucous membranes. Neuro: sedated CV: S1-S2 regular, distant PULM: Bilateral distant ventilated breath sounds, no accessory muscle use GI: Obese, soft, nontender, nondistended. Hypoactive bowel sounds. Extremities: Bilateral symmetric 1+ chronic LE edema noted with ruddy erythema overlying. Skin: Warm/dry, no rashes.  Labs and images reviewed  Assessment & Plan:   Acute-on-chronic systolic heart failure Echo from 11/30 with EF <20%, G1DD, severely reduced RVSF, severely enlarged RV - Cardiology following, appreciate assistance; considering R/LHC when more stable - Cardiac monitoring/tele - IF cr improving plan re-challenge with lasix  Acute Hypoxic and Hypercapnic respiratory failure -secondary to pulmonary edema and complicated further by BL positive Haemophilus influenza  PNA (12/3) , reintubated 12/7 for aspiration pneumonia Presumed OSA/OHS - initially Zosyn x 3 days transitioned to ceftriaxone , plan for 7 total days from aspiration event 12/7 -Extubated re-intubated 12/9  after fell asleep lying flat on back in ICU bed with hypoxemia and hypercarbia -PRVC FiO2 80% PEEP of 12 sat 100%, wean as tolerated -VAP bundle stressors or prophylaxis  Ileus/vomiting: Possible ileus on abdominal film -Reglan for 6 doses -- Adequate stooling now with FMS in place  New onset AF w/ intermittent ventricular ectopy, CHA2DS2/VASC score 2.  Bigeminy/trigeminy VT Started on amio and heparin 12/3.  - Cardiac monitoring - Optimize electrolytes as able - continue amio, hold heparin mild bleeding in suction tubing  Acute metabolic encephalopathy  Improved.  Myoclonic jerks attributed to uremia and sedation Possible seizure. Non witnessed by LTM. Felt metabolic in nature. CT head obtained and it was reassuring w/out new clear stroke/injury - Monitor off of AEDs - Keppra discontinued 12/4  Acute kidney injury w/ rising Cr in context of on-going diuresis  Renal fxn improved w/ holding lasix. - Trend BMP, Cr improving - Replete electrolytes as indicated - Monitor I&Os, goal euvolemia - Avoid nephrotoxic agents as able  Hypernatremia  -free water via TF, increased 12/10 -D5 d/c'd 12/9 given CM w/ reduced EF  Congestive hepatopathy, improving - Supportive care - Intermittent LFT monitoring with amio  Uncontrolled diabetes, now hypoglycemic while n.p.o. - SSI - Goal CBG 140-180 - Semglee 8 units daily  Morbid Obesity - Counseling re: weight loss/lifestyle changes  Best Practice: (right click and "Reselect all SmartList Selections" daily)   Diet/type: tubefeeds DVT prophylaxis: IV heparin on hold mild bleeding ET tube  Pressure ulcer(s): not present on admission  GI prophylaxis: H2B Lines: Central line Foley:  Yes, and it is still needed Code Status:  full code Last date of multidisciplinary goals of care discussion [n/a]  Critical care time:    CRITICAL CARE Performed by: Karren Burly   Total critical care time: 38 minutes  Critical care time was  exclusive of separately billable procedures and treating other patients.  Critical care was necessary to treat or prevent imminent or life-threatening deterioration.  Critical care was time spent personally by me on the following activities: development of treatment plan with patient and/or surrogate as well as nursing, discussions with consultants, evaluation of patient's response to treatment, examination of patient, obtaining history from patient or surrogate, ordering and performing treatments and interventions, ordering and review of laboratory studies, ordering and review of radiographic studies, pulse oximetry and re-evaluation of patient's condition. Karren Burly, MD [facility] 08/04/23 8:31 AM  Please see [email or web address] for pager details.  From 7A-7P if no response, please call [phone] After hours, please call ELink [phone]

## 2023-08-04 NOTE — Progress Notes (Signed)
Rounding Note    [name] Date of Encounter: 08/04/2023  [facility] Cardiologist: None   Subjective   Required reintubation overnight.  Renal function improving, Cr 2.3>1.7>1.5.  Sodium 151. BP 119/64.    Inpatient Medications    Scheduled Meds:  aspirin  81 mg Per Tube Daily   Chlorhexidine Gluconate Cloth  6 each Topical Daily   famotidine  20 mg Per Tube Daily   feeding supplement (PROSource TF20)  60 mL Per Tube TID   free water  200 mL Per Tube Q2H   Gerhardt's butt cream   Topical BID   insulin aspart  0-20 Units Subcutaneous Q4H   insulin glargine-yfgn  8 Units Subcutaneous Daily   nystatin cream   Topical TID   mouth rinse  15 mL Mouth Rinse Q2H   Continuous Infusions:  amiodarone 30 mg/hr (08/04/23 1100)   cefTRIAXone (ROCEPHIN)  IV Stopped (08/04/23 0957)   feeding supplement (JEVITY 1.5 CAL/FIBER)     fentaNYL infusion INTRAVENOUS 75 mcg/hr (08/04/23 1100)   propofol (DIPRIVAN) infusion 25 mcg/kg/min (08/04/23 1109)   PRN Meds: acetaminophen **OR** acetaminophen, albuterol, fentaNYL, metoprolol tartrate, midazolam, ondansetron (ZOFRAN) IV, mouth rinse, polyethylene glycol   Vital Signs    Vitals:   08/04/23 0904 08/04/23 1000 08/04/23 1100 08/04/23 1107  BP:  (!) 111/59 119/64   Pulse: 74 68 67   Resp: (!) 24 (!) 24 (!) 24   Temp:      TempSrc:      SpO2: 98% 98% 99% 100%  Weight:      Height:        Intake/Output Summary (Last 24 hours) at 08/04/2023 1149 Last data filed at 08/04/2023 1100 Gross per 24 hour  Intake 2527.73 ml  Output 1935 ml  Net 592.73 ml      08/04/2023    5:00 AM 08/03/2023    5:00 AM 08/02/2023    5:00 AM  Last 3 Weights  Weight (lbs) 428 lb 12.7 oz 426 lb 13 oz 425 lb 7.8 oz  Weight (kg) 194.5 kg 193.6 kg 193 kg      Telemetry    Afib with RVR overnight, now back in NSR - Personally Reviewed  ECG    No new ECG - Personally Reviewed  Physical Exam   GEN: Intubated and  sedated Neck: Difficult to assess JVD given habitus Cardiac: RRR, no murmurs, rubs, or gallops.  Respiratory: Diminished breath sounds GI: Soft, nontender MS: No edema Neuro:  Sedated Psych: Unable to assess  Labs    High Sensitivity Troponin:   Recent Labs  Lab 07/24/23 1815 07/24/23 2326 07/25/23 0805 07/25/23 1148 08/02/23 1615  TROPONINIHS 169* 156* 371* 302* 362*     Chemistry Recent Labs  Lab 07/29/23 0433 07/30/23 0511 07/30/23 0853 07/31/23 0430 08/01/23 0615 08/02/23 0620 08/03/23 0543 08/03/23 1938 08/03/23 2110 08/04/23 0827  NA 143 143  --  149*   < > 153* 152* 153* 153* 151*  K 4.1 3.4*  --  3.5   < > 4.0 3.4* 3.9 3.9 3.3*  CL 99 100  --  110   < > 114* 113*  --   --  111  CO2 32 32  --  29   < > 30 30  --   --  31  GLUCOSE 169* 251*  --  235*   < > 114* 152*  --   --  116*  BUN 82* 91*  --  68*   < > 67* 58*  --   --  44*  CREATININE 2.33* 2.05*  --  1.42*   < > 2.28* 1.70*  --   --  1.46*  CALCIUM 8.1* 8.2*  --  8.1*   < > 8.7* 8.1*  --   --  8.7*  MG  --   --  2.7*  --   --  2.5* 2.4  --   --   --   PROT 5.4* 6.1*  --  5.7*  --   --   --   --   --   --   ALBUMIN 2.4* 2.3*  --  2.1*  --   --   --   --   --   --   AST 34 23  --  17  --   --   --   --   --   --   ALT 100* 70*  --  51*  --   --   --   --   --   --   ALKPHOS 69 65  --  56  --   --   --   --   --   --   BILITOT 2.3* 1.7*  --  1.2*  --   --   --   --   --   --   GFRNONAA 30* 35*  --  54*   < > 30* 43*  --   --  52*  ANIONGAP 12 11  --  10   < > 9 9  --   --  9   < > = values in this interval not displayed.    Lipids  Recent Labs  Lab 08/04/23 0502  TRIG 137    Hematology Recent Labs  Lab 08/02/23 0620 08/03/23 0543 08/03/23 1938 08/03/23 2110 08/04/23 0502  WBC 11.8* 10.4  --   --  7.8  RBC 5.25 5.03  --   --  4.85  HGB 13.3 12.6* 15.6 15.3 12.2*  HCT 45.8 44.0 46.0 45.0 42.0  MCV 87.2 87.5  --   --  86.6  MCH 25.3* 25.0*  --   --  25.2*  MCHC 29.0* 28.6*  --   --   29.0*  RDW 19.1* 19.1*  --   --  18.8*  PLT 164 181  --   --  144*   Thyroid No results for input(s): "TSH", "FREET4" in the last 168 hours.  BNP Recent Labs  Lab 07/30/23 0511 07/31/23 0430 08/01/23 0615  BNP 245.3* 329.6* 455.6*    DDimer No results for input(s): "DDIMER" in the last 168 hours.   Radiology    Portable Chest x-ray  Result Date: 08/03/2023 CLINICAL DATA:  [id number], endotracheal tube present. [id number], encounter for orogastric tube placement. EXAM: PORTABLE CHEST 1 VIEW PORTABLE ABDOMEN 1 VIEW COMPARISON:  Portable chest yesterday at 6:53 a.m., portable abdomen today at 5:49 p.m. FINDINGS: Chest AP portable 8:25 p.m.: ETT tip is 1.8 cm from the carina. The patient is again rotated to the right. Left IJ central line remains laterally directed with the tip probably in the mid SVC. 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 There is stable cardiomegaly with central vascular prominence without overt edema. There is increasing consolidation in the right lower lung field, increased small right pleural effusion. Left lung, right upper lung fields remain clear allowing for rotation. There is  extensive spinal bridging enthesopathy. Flat plate abdomen study, upper abdomen only 8:38 p.m.: In contrast with today's earlier film, the feeding tube curves to the left and up within the stomach with the tip in the fundal area, previously was oriented to the right in the most distal stomach. The bowel pattern appears nonobstructive, but is not fully visible. There is no supine evidence of free air. IMPRESSION: 1. ETT tip 1.8 cm from the carina. 2. Left IJ central line remains laterally directed with the tip probably in the mid SVC. 3. Increasing consolidation in the right lower lung field, increased small right pleural effusion. 4. Cardiomegaly with central vascular prominence without overt edema. 5. Feeding tube curves to the left and up within the stomach  with the tip in the fundal area, previously was oriented to the right in the most distal stomach. Electronically Signed   By: Almira Bar M.D.   On: 08/03/2023 21:27   DG Abd 1 View  Result Date: 08/03/2023 CLINICAL DATA:  [id number], endotracheal tube present. [id number], encounter for orogastric tube placement. EXAM: PORTABLE CHEST 1 VIEW PORTABLE ABDOMEN 1 VIEW COMPARISON:  Portable chest yesterday at 6:53 a.m., portable abdomen today at 5:49 p.m. FINDINGS: Chest AP portable 8:25 p.m.: ETT tip is 1.8 cm from the carina. The patient is again rotated to the right. Left IJ central line remains laterally directed with the tip probably in the mid SVC. 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 There is stable cardiomegaly with central vascular prominence without overt edema. There is increasing consolidation in the right lower lung field, increased small right pleural effusion. Left lung, right upper lung fields remain clear allowing for rotation. 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 Flat plate abdomen study, upper abdomen only 8:38 p.m.: In contrast with today's earlier film, the feeding tube curves to the left and up within the stomach with the tip in the fundal area, previously was oriented to the right in the most distal stomach. The bowel pattern appears nonobstructive, but is not fully visible. There is no supine evidence of free air. IMPRESSION: 1. ETT tip 1.8 cm from the carina. 2. Left IJ central line remains laterally directed with the tip probably in the mid SVC. 3. Increasing consolidation in the right lower lung field, increased small right pleural effusion. 4. Cardiomegaly with central vascular prominence without overt edema. 5. Feeding tube curves to the left and up within the stomach with the tip in the fundal area, previously was oriented to the right in the most distal stomach. Electronically Signed   By: Almira Bar M.D.   On: 08/03/2023  21:27   DG Abd 1 View  Result Date: 08/03/2023 CLINICAL DATA:  NG placement. EXAM: ABDOMEN - 1 VIEW COMPARISON:  Abdominal radiograph dated 08/03/2023. FINDINGS: Feeding tube with tip in the right upper abdomen, likely in the distal stomach. IMPRESSION: Feeding tube with tip in the distal stomach. Electronically Signed   By: Elgie Collard M.D.   On: 08/03/2023 19:16   DG Abd Portable 1V  Result Date: 08/03/2023 CLINICAL DATA:  Feeding tube placement EXAM: PORTABLE ABDOMEN - 1 VIEW COMPARISON:  None Available. FINDINGS: Mild bibasilar atelectasis.  No pleural effusion or pneumothorax. Mild cardiomegaly. Enteric tube is poorly visualized due to penetration. This is favored to course below the diaphragm into the stomach but is poorly visualized/evaluated. Stable left IJ central venous catheter likely abutting the right lateral aspect of the SVC. IMPRESSION: Enteric tube  is poorly visualized due to penetration, favored to course below the diaphragm into the stomach, but poorly evaluated. Abdominal radiograph centered at the diaphragm is suggested to confirm current positioning. Electronically Signed   By: Charline Bills M.D.   On: 08/03/2023 17:15    Cardiac Studies     Patient Profile     67 y.o. male with morbid obesity we are consulted for new heart failure  Assessment & Plan    Acute combined heart failure: Echo 11/30 showed EF less than 20%, severe RV dysfunction.  Coox has been normal, no evidence of shock -Can fold in GDMT as able -Developed worsening AKI with diuresis, holding Lasix for now.  Difficult to assess volume status on exam.  CVP 10-11 from central line -Consider ischemic evaluation once recovered from acute illness  Acute respiratory failure with hypoxia/hypercapnia: Likely multifactorial with pulmonary edema and H. influenzae pneumonia, in addition to underlying OSA/OHS.  Reintubated 12/7 for aspiration pneumonia.  Extubated 12/9 but required reintubation later in the  day  Paroxysmal atrial fibrillation: On heparin drip, amiodarone.  Had recurrent Afib with RVR on 12/9 but now back in sinus rhythm  AKI: Renal function improving with holding diuresis, Cr 1.5 today  Hypernatremia: on free water flushes  Morbid obesity: Body mass index is 67.14 kg/m.   For questions or updates, please contact [facility] Please consult [email or web address] for contact info under        Signed, Little Ishikawa, MD  08/04/2023, 11:49 AM

## 2023-08-04 NOTE — TOC Progression Note (Signed)
Transition of Care [facility]) - Progression Note    Patient Details  Name: Levi [name] MRN: [id number] Date of Birth: 12-Sep-1955  [facility] CM/SW Contact  Mearl Latin, LCSW Phone Number: 08/04/2023, 4:07 PM  Clinical Narrative:    TOC following for needs as patient is currently intubated.     Barriers to Discharge: Continued Medical Work up  Expected Discharge Plan and Services   Discharge Planning Services: CM Consult   Living arrangements for the past 2 months: Single Family Home                                       Social Determinants of Health (SDOH) Interventions SDOH Screenings   Food Insecurity: Patient Unable To Answer (07/25/2023)  Housing: Patient Unable To Answer (07/25/2023)  Transportation Needs: Patient Unable To Answer (07/25/2023)  Utilities: Patient Unable To Answer (07/25/2023)  Tobacco Use: Low Risk  (07/24/2023)    Readmission Risk Interventions     No data to display

## 2023-08-04 NOTE — Progress Notes (Addendum)
Nutrition Follow-up  DOCUMENTATION CODES:   Morbid obesity  INTERVENTION:   Initiate tube feeding via OG tube: Vital 1.5 at 20 ml/h, increase by 10 ml every 4 hours to goal rate of 50 ml/h (1200 ml per day) Prosource TF20 60 ml QID  Provides 2120 kcal (2886 kcal total with propofol), 161 gm protein, 917 ml free water daily.  Free water 200 ml every 2 hours provides an additional 2400 ml free water daily.   NUTRITION DIAGNOSIS:   Increased nutrient needs related to chronic illness as evidenced by estimated needs.  Ongoing   GOAL:   Patient will meet greater than or equal to 90% of their needs  Progressing with TF initiation  MONITOR:   Labs, Weight trends, I & O's, Vent status  REASON FOR ASSESSMENT:   Consult, Ventilator Enteral/tube feeding initiation and management, Assessment of nutrition requirement/status  ASSESSMENT:   67 y.o. male with medical history significant of obesity. Presented with  hypoxia.  Patient presents with cough and shortness of breath since last week was given a dose of albuterol's found to be satting 64% on room air felt to have COPD exacerbation reports shortness of breath has been ongoing for at least 2 weeks.  Cortrak was placed yesterday afternoon, but TF was never initiated. Patient required re-intubation yesterday evening and Cortrak was removed. Patient currently has an OG tube. Discussed TF with MD; okay to resume TF via OG tube today and advance slowly.  Patient is currently intubated on ventilator support. MV: 12.9 L/min Temp (24hrs), Avg:99.5 F (37.5 C), Min:99.1 F (37.3 C), Max:100 F (37.8 C)  Propofol: 29 ml/hr providing 766 kcal from lipid.   Labs reviewed. Na 151, K 3.3, phos 2 (12/9) CBG: 113-102-119  Medications reviewed and include pepcid, novolog, semglee, fentanyl, propofol.  Free water flushes increased to 200 ml every 2 hours today d/t hypernatremia.  Diet Order:   Diet Order             Diet NPO time  specified  Diet effective now                   EDUCATION NEEDS:   No education needs have been identified at this time  Skin:  Skin Assessment: Skin Integrity Issues: Skin Integrity Issues:: Other (Comment) Other: ID abdomen  Last BM:  12/10 type 7 rectal tube  Height:   Ht Readings from Last 1 Encounters:  07/25/23 5' 7.01" (1.702 m)    Weight:   Wt Readings from Last 1 Encounters:  08/04/23 (!) 194.5 kg    Ideal Body Weight:  67.3 kg  BMI:  Body mass index is 67.14 kg/m.  Estimated Nutritional Needs:   Kcal:  2000-2300  Protein:  135-165 gm  Fluid:  2-2.3 L   Gabriel Rainwater RD, LDN, CNSC Please refer to Amion for contact information.

## 2023-08-04 NOTE — Progress Notes (Signed)
PHARMACY - ANTICOAGULATION  Pharmacy Consult for heparin Indication: atrial fibrillation  No Known Allergies  Patient Measurements: Height: 5' 7.01" (170.2 cm) Weight: (!) 194.5 kg (428 lb 12.7 oz) IBW/kg (Calculated) : 66.12 Heparin Dosing Weight: 117 kg  Vital Signs: Temp: 99.1 F (37.3 C) (12/10 0400) Temp Source: Axillary (12/10 0400) BP: 127/64 (12/10 0813) Pulse Rate: 69 (12/10 0813)  Labs: Recent Labs    08/02/23 0620 08/02/23 1515 08/02/23 1615 08/03/23 0035 08/03/23 0543 08/03/23 1050 08/03/23 1938 08/03/23 2110 08/03/23 2128 08/04/23 0502  HGB 13.3  --   --   --  12.6*  --  15.6 15.3  --  12.2*  HCT 45.8  --   --   --  44.0  --  46.0 45.0  --  42.0  PLT 164  --   --   --  181  --   --   --   --  144*  APTT  --   --   --   --   --   --   --   --  49*  --   HEPARINUNFRC 0.23*   < >  --    < >  --  0.11*  --   --  0.27* 0.17*  CREATININE 2.28*  --   --   --  1.70*  --   --   --   --   --   TROPONINIHS  --   --  362*  --   --   --   --   --   --   --    < > = values in this interval not displayed.    Estimated Creatinine Clearance: 69.1 mL/min (A) (by C-G formula based on SCr of 1.7 mg/dL (H)).  Assessment: 67 y.o. male with Afib. Pharmacy consulted for heparin infusion. No anticoagulants prior to admission  D/w MD this AM - given traumatic re-intubation yesterday with some bleeding from ETT will hold heparin infusion today. Indicated for afib (currently NSR)  Goal of Therapy:  Heparin level 0.3-0.7 units/ml Monitor platelets by anticoagulation protocol: Yes   Plan:  Stop heparin gtt.  Reconsult when appropriate  Calton Dach, PharmD, BCCCP Clinical Pharmacist 08/04/2023 8:21 AM

## 2023-08-04 NOTE — TOC Progression Note (Signed)
Transition of Care [facility]) - Progression Note    Patient Details  Name: SALIH WARWICK MRN: [id number] Date of Birth: 1956-01-25  Transition of Care [facility]) CM/SW Contact  Mearl Latin, LCSW Phone Number: 08/04/2023, 4:07 PM  Clinical Narrative:    TOC following for needs as patient is currently intubated. Per MD, not medically stable for LTACH.      Barriers to Discharge: Continued Medical Work up  Expected Discharge Plan and Services   Discharge Planning Services: CM Consult   Living arrangements for the past 2 months: Single Family Home                                       Social Determinants of Health (SDOH) Interventions SDOH Screenings   Food Insecurity: Patient Unable To Answer (07/25/2023)  Housing: Patient Unable To Answer (07/25/2023)  Transportation Needs: Patient Unable To Answer (07/25/2023)  Utilities: Patient Unable To Answer (07/25/2023)  Tobacco Use: Low Risk  (07/24/2023)    Readmission Risk Interventions     No data to display

## 2023-08-04 NOTE — Plan of Care (Signed)
  Problem: Education: Goal: Ability to describe self-care measures that may prevent or decrease complications (Diabetes Survival Skills Education) will improve Outcome: Not Progressing   Problem: Coping: Goal: Ability to adjust to condition or change in health will improve Outcome: Not Progressing   Problem: Fluid Volume: Goal: Ability to maintain a balanced intake and output will improve Outcome: Not Progressing   Problem: Skin Integrity: Goal: Risk for impaired skin integrity will decrease Outcome: Not Progressing   Problem: Tissue Perfusion: Goal: Adequacy of tissue perfusion will improve Outcome: Not Progressing

## 2023-08-04 NOTE — Progress Notes (Signed)
eLink Physician-Brief Progress Note [name] DOB: 13-Aug-1956 MRN: [id number]   Date of Service  08/04/2023  HPI/Events of Note  I was asked to evaluate the patient urgently for possible seizure activity.  Tremors noted on the right and involving mouth.  This was previously noted by neurology based on notes 12/4.  Patient is withdrawing appropriately and spontaneously opening eyes.  eICU Interventions  Continue to observe Follow neuro exam     Intervention Category Major Interventions: Seizures - evaluation and management  Henry Russel, P 08/04/2023, 3:38 AM

## 2023-08-04 NOTE — TOC Initial Note (Signed)
Transition of Care [facility]) - Initial/Assessment Note    Patient Details  Name: Levi [name] MRN: [id number] Date of Birth: May 14, 1956  [facility]) CM/SW Contact:    Mearl Latin, LCSW Phone Number: 08/04/2023, 4:06 PM  Clinical Narrative:                 Patient admitted from [facility] and resides at home with spouse. TOC following for needs as patient is currently intubated.     Barriers to Discharge: Continued Medical Work up   Patient Goals and CMS Choice            Expected Discharge Plan and Services   Discharge Planning Services: CM Consult   Living arrangements for the past 2 months: Single Family Home                                      Prior Living Arrangements/Services Living arrangements for the past 2 months: Single Family Home Lives with:: Spouse Patient language and need for interpreter reviewed:: Yes        Need for Family Participation in Patient Care: Yes (Comment) Care giver support system in place?: Yes (comment)   Criminal Activity/Legal Involvement Pertinent to Current Situation/Hospitalization: No - Comment as needed  Activities of Daily Living   ADL Screening (condition at time of admission) Independently performs ADLs?: No (UTA) Does the patient have a NEW difficulty with bathing/dressing/toileting/self-feeding that is expected to last >3 days?: No (UTA) Does the patient have a NEW difficulty with getting in/out of bed, walking, or climbing stairs that is expected to last >3 days?: No (UTA) Does the patient have a NEW difficulty with communication that is expected to last >3 days?: No (UTA) Is the patient deaf or have difficulty hearing?: No (UTA) Does the patient have difficulty seeing, even when wearing glasses/contacts?: No (UTA) Does the patient have difficulty concentrating, remembering, or making decisions?: No (UTA)  Permission Sought/Granted                  Emotional Assessment Appearance:: Appears stated  age Attitude/Demeanor/Rapport: Unable to Assess, Intubated (Following Commands or Not Following Commands) Affect (typically observed): Unable to Assess Orientation: :  (Intubated) Alcohol / Substance Use: Not Applicable Psych Involvement: No (comment)  Admission diagnosis:  Hyperkalemia [E87.5] CHF (congestive heart failure) (HCC) [I50.9] Aortic atherosclerosis (HCC) [I70.0] Pulmonary hypertension (HCC) [I27.20] Elevated troponin [R79.89] Acute respiratory failure with hypoxia (HCC) [J96.01] AKI (acute kidney injury) (HCC) [N17.9] Congestive heart failure, unspecified HF chronicity, unspecified heart failure type (HCC) [I50.9] Patient Active Problem List   Diagnosis Date Noted   Pressure injury of skin 08/03/2023   Pneumonia of both lungs due to infectious organism 07/29/2023   Metabolic encephalopathy 07/29/2023   Atherosclerosis of aorta (HCC) 07/29/2023   PAF (paroxysmal atrial fibrillation) (HCC) 07/28/2023   Biventricular heart failure (HCC) 07/28/2023   Acute heart failure with reduced ejection fraction (HFrEF, <= 40%) (HCC) 07/27/2023   Cardiomyopathy (HCC) 07/27/2023   Acute renal insufficiency 07/27/2023   Hypokalemia 07/27/2023   Acute hypercapnic respiratory failure (HCC) 07/27/2023   Biventricular heart failure, NYHA class 3 (HCC) 07/27/2023   CHF (congestive heart failure) (HCC) 07/24/2023   Acute respiratory failure with hypoxia (HCC) 07/24/2023   Elevated troponin 07/24/2023   AKI (acute kidney injury) (HCC) 07/24/2023   Hyperkalemia 07/24/2023   Aortic atherosclerosis (HCC) 07/24/2023   Pulmonary hypertension (HCC) 07/24/2023  PCP:  Patient, No Pcp Per Pharmacy:   [id number] [place] Otto, [place] - [phone] AT [facility] & [address] [place] [address] Phone: [phone] Fax: [phone]     Social Determinants of Health (SDOH) Social History: SDOH Screenings   Food Insecurity: Patient  Unable To Answer (07/25/2023)  Housing: Patient Unable To Answer (07/25/2023)  Transportation Needs: Patient Unable To Answer (07/25/2023)  Utilities: Patient Unable To Answer (07/25/2023)  Tobacco Use: Low Risk  (07/24/2023)   SDOH Interventions:     Readmission Risk Interventions     No data to display

## 2023-08-05 ENCOUNTER — Inpatient Hospital Stay (HOSPITAL_COMMUNITY): Payer: Medicare HMO

## 2023-08-05 DIAGNOSIS — J9601 Acute respiratory failure with hypoxia: Secondary | ICD-10-CM | POA: Diagnosis not present

## 2023-08-05 DIAGNOSIS — E870 Hyperosmolality and hypernatremia: Secondary | ICD-10-CM | POA: Diagnosis not present

## 2023-08-05 DIAGNOSIS — R569 Unspecified convulsions: Secondary | ICD-10-CM | POA: Diagnosis not present

## 2023-08-05 DIAGNOSIS — I5041 Acute combined systolic (congestive) and diastolic (congestive) heart failure: Secondary | ICD-10-CM | POA: Diagnosis not present

## 2023-08-05 DIAGNOSIS — I480 Paroxysmal atrial fibrillation: Secondary | ICD-10-CM | POA: Diagnosis not present

## 2023-08-05 DIAGNOSIS — J9602 Acute respiratory failure with hypercapnia: Secondary | ICD-10-CM | POA: Diagnosis not present

## 2023-08-05 LAB — CULTURE, RESPIRATORY W GRAM STAIN

## 2023-08-05 LAB — BASIC METABOLIC PANEL
Anion gap: 5 (ref 5–15)
Anion gap: 10 (ref 5–15)
BUN: 40 mg/dL — ABNORMAL HIGH (ref 8–23)
BUN: 43 mg/dL — ABNORMAL HIGH (ref 8–23)
CO2: 30 mmol/L (ref 22–32)
CO2: 27 mmol/L (ref 22–32)
Calcium: 8.4 mg/dL — ABNORMAL LOW (ref 8.9–10.3)
Calcium: 8.9 mg/dL (ref 8.9–10.3)
Chloride: 111 mmol/L (ref 98–111)
Chloride: 110 mmol/L (ref 98–111)
Creatinine, Ser: 1.38 mg/dL — ABNORMAL HIGH (ref 0.61–1.24)
Creatinine, Ser: 1.4 mg/dL — ABNORMAL HIGH (ref 0.61–1.24)
GFR, Estimated: 56 mL/min — ABNORMAL LOW (ref >60)
GFR, Estimated: 55 mL/min — ABNORMAL LOW (ref >60)
Glucose, Bld: 157 mg/dL — ABNORMAL HIGH (ref 70–99)
Glucose, Bld: 136 mg/dL — ABNORMAL HIGH (ref 70–99)
Potassium: 3.1 mmol/L — ABNORMAL LOW (ref 3.5–5.1)
Potassium: 3.7 mmol/L (ref 3.5–5.1)
Sodium: 146 mmol/L — ABNORMAL HIGH (ref 135–145)
Sodium: 147 mmol/L — ABNORMAL HIGH (ref 135–145)

## 2023-08-05 LAB — GLUCOSE, CAPILLARY
Glucose-Capillary: 148 mg/dL — ABNORMAL HIGH (ref 70–99)
Glucose-Capillary: 132 mg/dL — ABNORMAL HIGH (ref 70–99)
Glucose-Capillary: 137 mg/dL — ABNORMAL HIGH (ref 70–99)
Glucose-Capillary: 134 mg/dL — ABNORMAL HIGH (ref 70–99)
Glucose-Capillary: 151 mg/dL — ABNORMAL HIGH (ref 70–99)
Glucose-Capillary: 136 mg/dL — ABNORMAL HIGH (ref 70–99)

## 2023-08-05 LAB — CBC
HCT: 42.3 % (ref 39.0–52.0)
Hemoglobin: 12.5 g/dL — ABNORMAL LOW (ref 13.0–17.0)
MCH: 25 pg — ABNORMAL LOW (ref 26.0–34.0)
MCHC: 29.6 g/dL — ABNORMAL LOW (ref 30.0–36.0)
MCV: 84.6 fL (ref 80.0–100.0)
Platelets: 147 10*3/uL — ABNORMAL LOW (ref 150–400)
RBC: 5 MIL/uL (ref 4.22–5.81)
RDW: 19 % — ABNORMAL HIGH (ref 11.5–15.5)
WBC: 6.6 10*3/uL (ref 4.0–10.5)
nRBC: 0 % (ref 0.0–0.2)

## 2023-08-05 MED ORDER — POTASSIUM CHLORIDE 20 MEQ PO PACK
20.0000 meq | PACK | ORAL | Status: DC
  Administered 2023-08-05: 20 meq
  Filled 2023-08-05: qty 1

## 2023-08-05 MED ORDER — AMIODARONE HCL 200 MG PO TABS
200.0000 mg | ORAL_TABLET | Freq: Two times a day (BID) | ORAL | Status: DC
  Administered 2023-08-05 – 2023-08-08 (×7): 200 mg
  Filled 2023-08-05 (×7): qty 1

## 2023-08-05 MED ORDER — POTASSIUM CHLORIDE 20 MEQ PO PACK
40.0000 meq | PACK | Freq: Once | ORAL | Status: AC
  Administered 2023-08-05: 40 meq
  Filled 2023-08-05: qty 2

## 2023-08-05 MED ORDER — AMIODARONE HCL 200 MG PO TABS: 200.0000 mg | ORAL_TABLET | Freq: Every day | ORAL | Status: DC

## 2023-08-05 MED ORDER — POTASSIUM CHLORIDE 10 MEQ/50ML IV SOLN
10.0000 meq | INTRAVENOUS | Status: AC
  Administered 2023-08-05 (×4): 10 meq via INTRAVENOUS
  Filled 2023-08-05 (×4): qty 50

## 2023-08-05 MED ORDER — FUROSEMIDE 10 MG/ML IJ SOLN
80.0000 mg | Freq: Four times a day (QID) | INTRAMUSCULAR | Status: AC
  Administered 2023-08-05 (×2): 80 mg via INTRAVENOUS
  Filled 2023-08-05 (×2): qty 8

## 2023-08-05 NOTE — IPAL (Signed)
  Interdisciplinary Goals of Care Family Meeting   Date carried out: 08/05/2023  Location of the meeting: Bedside  Member's involved: Physician, Bedside Registered Nurse, and Family Member or next of kin  Durable Power of Attorney or acting medical decision maker:  Wife - Levi [name] Son, brother, DIL present    Discussion: We discussed goals of care for [name] .  Chronic issues with heart failure EF less than 20%, OSA/OHS with chronic hypercarbia, recent lumbar fusion we cannot aspiration pneumonia and developing weakness of critical care, myopathy causing worsened hypercarbia. Was medical ready for extubation 12/9 and reintubated hours later with hypercarbia.  Unfortunately, this is largely irreversible in the setting of his OSA/OHS.  We can try to support through with BiPAP but I suspect this will be a recurrent issue.  Likely will worsen with worsening weakness and other insults of critical care etc.  Discussed optimizing diuresis as able providing supportive care.  But if unable to survive without life support in the future of the options are tracheostomy which family states he would not want.  In light of this, recommend aggressive medical care and at some point when medically ready a trial of extubation again.  However recommend CODE STATUS of DNR.  If he does poorly then we would make him comfortable.  If he does great and recovers in the future a DNR decision can always be reversed by him or family members if felt that was the right thing to do.  They agreed with DNR.   Code status:   Code Status: Full Code   Disposition: Continue current acute care  Time spent for the meeting: 10 minutes    Karren Burly, MD  08/05/2023, 10:16 AM

## 2023-08-05 NOTE — Progress Notes (Signed)
PT Cancellation Note  Patient Details Name: BAYARD DEFUSCO MRN: [id number] DOB: 08/26/1955   Cancelled Treatment:    Reason Eval/Treat Not Completed: (P) Fatigue/lethargy limiting ability to participate;Patient not medically ready. RN reports pt is currently sedated. Pt also intubated. Will plan to follow-up another day as able.  Virgil Benedict, PT, DPT Acute Rehabilitation Services  Office: [phone]    Bettina Gavia 08/05/2023, 12:54 PM

## 2023-08-05 NOTE — TOC Progression Note (Signed)
Transition of Care [facility]) - Progression Note    Patient Details  Name: Levi [name] MRN: [id number] Date of Birth: 1956/04/26  Transition of Care [facility]) CM/SW Contact  Mearl Latin, LCSW Phone Number: 08/05/2023, 9:22 AM  Clinical Narrative:    TOC continuing to follow.     Barriers to Discharge: Continued Medical Work up  Expected Discharge Plan and Services   Discharge Planning Services: CM Consult   Living arrangements for the past 2 months: Single Family Home                                       Social Determinants of Health (SDOH) Interventions SDOH Screenings   Food Insecurity: Patient Unable To Answer (07/25/2023)  Housing: Patient Unable To Answer (07/25/2023)  Transportation Needs: Patient Unable To Answer (07/25/2023)  Utilities: Patient Unable To Answer (07/25/2023)  Tobacco Use: Low Risk  (07/24/2023)    Readmission Risk Interventions     No data to display

## 2023-08-05 NOTE — Procedures (Signed)
[name]  MRN: [id number]  Epilepsy Attending: Charlsie Quest  Referring Physician/Provider: Karren Burly, MD  Date: 08/05/2023 Duration: 24.43 mins  Patient history: 67 yo M with ams, had seizure like activity. EEG to evaluate for seizure  Level of alertness: comatose  AEDs during EEG study: Propofol  Technical aspects: This EEG study was done with scalp electrodes positioned according to the 10-20 International system of electrode placement. Electrical activity was reviewed with band pass filter of 1-70Hz , sensitivity of 7 uV/mm, display speed of 63mm/sec with a 60Hz  notched filter applied as appropriate. EEG data were recorded continuously and digitally stored.  Video monitoring was available and reviewed as appropriate.  Description: EEG showed continuous generalized 3 to 6 Hz theta-delta slowing admixed with 12-15hz  beta activity. Hyperventilation and photic stimulation were not performed.     ABNORMALITY - Continuous slow, generalized  IMPRESSION: This study is suggestive of severe diffuse encephalopathy likely related to sedation. No seizures or epileptiform discharges were seen throughout the recording.  Yordy Matton Annabelle Harman

## 2023-08-05 NOTE — Progress Notes (Signed)
NAME:  Levi [name], MRN:  [id number], DOB:  08/15/1956, LOS: 12 ADMISSION DATE:  07/24/2023, CONSULTATION DATE: 07/24/2023  REFERRING MD: Marlene Bast, CHIEF COMPLAINT: Shortness of breath and hypoxia  History of Present Illness:  67 year old man with morbid obesity, probably untreated OSA/OHS and knee OA who presented to ED with dry cough, DOE, SOB, LL edema, orthopnea for 1-2 weeks. Home health RN found his SpO2 64% on RA. Given a dose of Albuterol. Denied wheezing, CP, N/V/D, abd pain, f/c/r, and rash. Not on home O2 or NIV.  No smoking, illicit drug use, or alcoholism.  No hx of COPD, asthma, VTE, cardiac hx, liver disease, or CKD Developed runs of VT in ED. Admitted with biventricular failure , H. influenzae pneumonia.  Course complicated by seizures and aspiration episode  Pertinent Medical History:  History reviewed. No pertinent past medical history.  Significant Hospital Events: Including procedures, antibiotic start and stop dates in addition to other pertinent events   11/29 Admitted to [facility] 12/3 Trach aspirate sent for thick secretions. Amiodarone started for AF w/ bigeminy and trigeminy. New onset rhythmic twitching left face, LLE. Resolved spontaneously. 12/4 Seen by Neuro: myoclonic jerks noted on Ceribell monitor but not able to distinguish if seizure activity. Transferred to [facility] for LTM. Started Keppra.  Febrile. Sats 88%. LEUS negative for DVT. LTM neg for seizure so Keppra stopped later that day.  Had been started on vanc and zosyn 12/3 for fever CT was neg for stroke. Vanc stopped 12/5 sputum H flu BL positive. Change to CTX. More awake. Trying to wean sedation to assess weaning ability. Renal fxn improved after holding Lasix. 12/6 extubated , BiPAP DC'd due to vomiting 12/7 recurrent vomiting with aspiration >> reintubated 12/9 extubated, doing well.  Later in the day fell asleep while on his back became encephalopathic desaturated with SBT reintubated due to  hypoxemia and hypercarbia on blood gas  Interim History / Subjective:   Decent urine output but slightly positive with 1 dose of Lasix.  Increasing frequency of Lasix dosing today.  Family meeting, see note.  Objective:  Blood pressure 110/66, pulse 71, temperature 99.2 F (37.3 C), temperature source Axillary, resp. rate (!) 24, height 5' 7.01" (1.702 m), weight (!) 193.3 kg, SpO2 95%. CVP:  [7 mmHg-13 mmHg] 8 mmHg  Vent Mode: PRVC FiO2 (%):  [40 %] 40 % Set Rate:  [24 bmp] 24 bmp Vt Set:  [530 mL] 530 mL PEEP:  [10 cmH20-12 cmH20] 10 cmH20 Plateau Pressure:  [16 cmH20-29 cmH20] 25 cmH20   Intake/Output Summary (Last 24 hours) at 08/05/2023 1330 Last data filed at 08/05/2023 1200 Gross per 24 hour  Intake 4228.15 ml  Output 3000 ml  Net 1228.15 ml   Filed Weights   08/03/23 0500 08/04/23 0500 08/05/23 0500  Weight: (!) 193.6 kg (!) 194.5 kg (!) 193.3 kg   Physical Examination: General: Acute-on-chronically ill-appearing morbidly obese middle-aged man in NAD.  HEENT: [place]/AT, anicteric sclera, PERRL, moist mucous membranes. Neuro: sedated CV: S1-S2 regular, distant PULM: Bilateral distant ventilated breath sounds, no accessory muscle use GI: Obese, soft, nontender, nondistended. Hypoactive bowel sounds. Extremities: Bilateral symmetric 1+ chronic LE edema noted with ruddy erythema overlying. Skin: Warm/dry, no rashes.  Labs and images reviewed  Assessment & Plan:   Acute-on-chronic systolic heart failure Echo from 11/30 with EF <20%, G1DD, severely reduced RVSF, severely enlarged RV - Cardiology following, appreciate assistance; considering R/LHC when more stable - Cardiac monitoring/tele - I increase Lasix to twice daily  dosing 12/11  Acute Hypoxic and Hypercapnic respiratory failure -secondary to pulmonary edema and complicated further by BL positive Haemophilus influenza PNA (12/3) , reintubated 12/7 for aspiration pneumonia, possible exacerbation of hypercapnia due  to pulmonary edema, reintubated 12/9 for hypercapnia Presumed OSA/OHS - initially Zosyn x 3 days transitioned to ceftriaxone , plan for 7 total days from aspiration event 12/7 -Extubated re-intubated 12/9 after fell asleep lying flat on back in ICU bed with hypoxemia and hypercarbia -PRVC FiO2 80% PEEP of 12 sat 100%, wean as tolerated -VAP bundle stressors or prophylaxis -Increase intensity diuresis  Ileus/vomiting: Possible ileus on abdominal film -Reglan for 6 doses -- Adequate stooling now with FMS in place  New onset AF w/ intermittent ventricular ectopy, CHA2DS2/VASC score 2.  Bigeminy/trigeminy VT Started on amio and heparin 12/3.  - Cardiac monitoring - Optimize electrolytes as able - continue amio, hold heparin mild bleeding in suction tubing  Acute metabolic encephalopathy  Improved.  Myoclonic jerks attributed to uremia and sedation Possible seizure. Non witnessed by LTM. Felt metabolic in nature. CT head obtained and it was reassuring w/out new clear stroke/injury - Monitor off of AEDs - Keppra discontinued 12/4 -- Spot EEG 12/11 with left shoulder shivering negative for seizure  Acute kidney injury w/ rising Cr in context of on-going diuresis  Renal fxn improved w/ holding lasix. - Trend BMP, Cr improving - Replete electrolytes as indicated - Monitor I&Os, goal euvolemia - Avoid nephrotoxic agents as able  Hypernatremia: Improving -free water via TF, increased 12/10   Congestive hepatopathy, improving - Supportive care - Intermittent LFT monitoring with amio  Uncontrolled diabetes, now hypoglycemic while n.p.o. - SSI - Goal CBG 140-180 - Semglee 8 units daily  Morbid Obesity - Counseling re: weight loss/lifestyle changes  Best Practice: (right click and "Reselect all SmartList Selections" daily)   Diet/type: tubefeeds DVT prophylaxis: IV heparin on hold mild bleeding ET tube  Pressure ulcer(s): not present on admission  GI prophylaxis: H2B Lines:  Central line Foley:  Yes, and it is still needed Code Status:  full code Last date of multidisciplinary goals of care discussion [n/a]  Critical care time:    CRITICAL CARE Performed by: Karren Burly   Total critical care time: 30 minutes  Critical care time was exclusive of separately billable procedures and treating other patients.  Critical care was necessary to treat or prevent imminent or life-threatening deterioration.  Critical care was time spent personally by me on the following activities: development of treatment plan with patient and/or surrogate as well as nursing, discussions with consultants, evaluation of patient's response to treatment, examination of patient, obtaining history from patient or surrogate, ordering and performing treatments and interventions, ordering and review of laboratory studies, ordering and review of radiographic studies, pulse oximetry and re-evaluation of patient's condition. Karren Burly, MD [facility] 08/05/23 1:30 PM  Please see [email or web address] for pager details.  From 7A-7P if no response, please call [phone] After hours, please call ELink [phone]

## 2023-08-05 NOTE — Progress Notes (Signed)
[facility] ADULT ICU REPLACEMENT PROTOCOL   The patient does apply for the [facility] Adult ICU Electrolyte Replacment Protocol based on the criteria listed below:   1.Exclusion criteria: TCTS, ECMO, Dialysis, and Myasthenia Gravis patients 2. Is GFR >/= 30 ml/min? Yes.    Patient's GFR today is 56 3. Is SCr </= 2? Yes.   Patient's SCr is 1.38 mg/dL 4. Did SCr increase >/= 0.5 in 24 hours? No. 5.Pt's weight >40kg  Yes.   6. Abnormal electrolyte(s): potassium 3.1  7. Electrolytes replaced per protocol 8.  Call MD STAT for K+ </= 2.5, Phos </= 1, or Mag </= 1 Physician:  protocol  Melvern Banker 08/05/2023 5:21 AM

## 2023-08-05 NOTE — Progress Notes (Signed)
Rounding Note    [name] Date of Encounter: 08/05/2023  [facility] Cardiologist: None   Subjective   Remains intubated.  Renal function improving, Cr 2.3>1.7>1.5>1.4.  Sodium 146. BP 123/108  Inpatient Medications    Scheduled Meds:  aspirin  81 mg Per Tube Daily   Chlorhexidine Gluconate Cloth  6 each Topical Daily   famotidine  20 mg Per Tube Daily   feeding supplement (PROSource TF20)  60 mL Per Tube QID   free water  200 mL Per Tube Q2H   furosemide  80 mg Intravenous Q6H   Gerhardt's butt cream   Topical BID   insulin aspart  0-20 Units Subcutaneous Q4H   insulin glargine-yfgn  8 Units Subcutaneous Daily   nystatin cream   Topical TID   mouth rinse  15 mL Mouth Rinse Q2H   Continuous Infusions:  cefTRIAXone (ROCEPHIN)  IV Stopped (08/04/23 0957)   feeding supplement (VITAL 1.5 CAL) 40 mL/hr at 08/05/23 1000   fentaNYL infusion INTRAVENOUS 75 mcg/hr (08/05/23 1000)   potassium chloride 10 mEq (08/05/23 1043)   propofol (DIPRIVAN) infusion 15 mcg/kg/min (08/05/23 1000)   PRN Meds: acetaminophen **OR** acetaminophen, albuterol, fentaNYL, metoprolol tartrate, midazolam, ondansetron (ZOFRAN) IV, mouth rinse, polyethylene glycol   Vital Signs    Vitals:   08/05/23 0800 08/05/23 0803 08/05/23 0900 08/05/23 1124  BP: 115/72  (!) 123/108   Pulse: 63 63 74   Resp: 20 (!) 24 (!) 22   Temp:      TempSrc:      SpO2: 96% 96% 96% 95%  Weight:      Height:        Intake/Output Summary (Last 24 hours) at 08/05/2023 1142 Last data filed at 08/05/2023 1000 Gross per 24 hour  Intake 4069.2 ml  Output 3000 ml  Net 1069.2 ml      08/05/2023    5:00 AM 08/04/2023    5:00 AM 08/03/2023    5:00 AM  Last 3 Weights  Weight (lbs) 426 lb 2.4 oz 428 lb 12.7 oz 426 lb 13 oz  Weight (kg) 193.3 kg 194.5 kg 193.6 kg      Telemetry    Afib with RVR overnight, now back in NSR - Personally Reviewed  ECG    No new ECG - Personally  Reviewed  Physical Exam   GEN: Intubated and sedated Neck: Difficult to assess JVD given habitus Cardiac: RRR, no murmurs, rubs, or gallops.  Respiratory: Diminished breath sounds GI: Soft, nontender MS: No edema Neuro:  Sedated Psych: Unable to assess  Labs    High Sensitivity Troponin:   Recent Labs  Lab 07/24/23 1815 07/24/23 2326 07/25/23 0805 07/25/23 1148 08/02/23 1615  TROPONINIHS 169* 156* 371* 302* 362*     Chemistry Recent Labs  Lab 07/30/23 0511 07/30/23 0853 07/31/23 0430 08/01/23 0615 08/02/23 0620 08/03/23 0543 08/03/23 1938 08/03/23 2110 08/04/23 0827 08/05/23 0341  NA 143  --  149*   < > 153* 152*   < > 153* 151* 146*  K 3.4*  --  3.5   < > 4.0 3.4*   < > 3.9 3.3* 3.1*  CL 100  --  110   < > 114* 113*  --   --  111 111  CO2 32  --  29   < > 30 30  --   --  31 30  GLUCOSE 251*  --  235*   < > 114* 152*  --   --  116* 157*  BUN 91*  --  68*   < > 67* 58*  --   --  44* 40*  CREATININE 2.05*  --  1.42*   < > 2.28* 1.70*  --   --  1.46* 1.38*  CALCIUM 8.2*  --  8.1*   < > 8.7* 8.1*  --   --  8.7* 8.4*  MG  --  2.7*  --   --  2.5* 2.4  --   --   --   --   PROT 6.1*  --  5.7*  --   --   --   --   --   --   --   ALBUMIN 2.3*  --  2.1*  --   --   --   --   --   --   --   AST 23  --  17  --   --   --   --   --   --   --   ALT 70*  --  51*  --   --   --   --   --   --   --   ALKPHOS 65  --  56  --   --   --   --   --   --   --   BILITOT 1.7*  --  1.2*  --   --   --   --   --   --   --   GFRNONAA 35*  --  54*   < > 30* 43*  --   --  52* 56*  ANIONGAP 11  --  10   < > 9 9  --   --  9 5   < > = values in this interval not displayed.    Lipids  Recent Labs  Lab 08/04/23 0502  TRIG 137    Hematology Recent Labs  Lab 08/03/23 0543 08/03/23 1938 08/03/23 2110 08/04/23 0502 08/05/23 0341  WBC 10.4  --   --  7.8 6.6  RBC 5.03  --   --  4.85 5.00  HGB 12.6*   < > 15.3 12.2* 12.5*  HCT 44.0   < > 45.0 42.0 42.3  MCV 87.5  --   --  86.6 84.6  MCH  25.0*  --   --  25.2* 25.0*  MCHC 28.6*  --   --  29.0* 29.6*  RDW 19.1*  --   --  18.8* 19.0*  PLT 181  --   --  144* 147*   < > = values in this interval not displayed.   Thyroid No results for input(s): "TSH", "FREET4" in the last 168 hours.  BNP Recent Labs  Lab 07/30/23 0511 07/31/23 0430 08/01/23 0615  BNP 245.3* 329.6* 455.6*    DDimer No results for input(s): "DDIMER" in the last 168 hours.   Radiology    Portable Chest x-ray  Result Date: 08/03/2023 CLINICAL DATA:  [id number], endotracheal tube present. [id number], encounter for orogastric tube placement. EXAM: PORTABLE CHEST 1 VIEW PORTABLE ABDOMEN 1 VIEW COMPARISON:  Portable chest yesterday at 6:53 a.m., portable abdomen today at 5:49 p.m. FINDINGS: Chest AP portable 8:25 p.m.: ETT tip is 1.8 cm from the carina. The patient is again rotated to the right. Left IJ central line remains laterally directed with the tip probably in the mid SVC. 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 There  is stable cardiomegaly with central vascular prominence without overt edema. There is increasing consolidation in the right lower lung field, increased small right pleural effusion. Left lung, right upper lung fields remain clear allowing for rotation. 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 Flat plate abdomen study, upper abdomen only 8:38 p.m.: In contrast with today's earlier film, the feeding tube curves to the left and up within the stomach with the tip in the fundal area, previously was oriented to the right in the most distal stomach. The bowel pattern appears nonobstructive, but is not fully visible. There is no supine evidence of free air. IMPRESSION: 1. ETT tip 1.8 cm from the carina. 2. Left IJ central line remains laterally directed with the tip probably in the mid SVC. 3. Increasing consolidation in the right lower lung field, increased small right pleural effusion. 4. Cardiomegaly with  central vascular prominence without overt edema. 5. Feeding tube curves to the left and up within the stomach with the tip in the fundal area, previously was oriented to the right in the most distal stomach. Electronically Signed   By: Almira Bar M.D.   On: 08/03/2023 21:27   DG Abd 1 View  Result Date: 08/03/2023 CLINICAL DATA:  [id number], endotracheal tube present. [id number], encounter for orogastric tube placement. EXAM: PORTABLE CHEST 1 VIEW PORTABLE ABDOMEN 1 VIEW COMPARISON:  Portable chest yesterday at 6:53 a.m., portable abdomen today at 5:49 p.m. FINDINGS: Chest AP portable 8:25 p.m.: ETT tip is 1.8 cm from the carina. The patient is again rotated to the right. Left IJ central line remains laterally directed with the tip probably in the mid SVC. 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 There is stable cardiomegaly with central vascular prominence without overt edema. There is increasing consolidation in the right lower lung field, increased small right pleural effusion. Left lung, right upper lung fields remain clear allowing for rotation. 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 Flat plate abdomen study, upper abdomen only 8:38 p.m.: In contrast with today's earlier film, the feeding tube curves to the left and up within the stomach with the tip in the fundal area, previously was oriented to the right in the most distal stomach. The bowel pattern appears nonobstructive, but is not fully visible. There is no supine evidence of free air. IMPRESSION: 1. ETT tip 1.8 cm from the carina. 2. Left IJ central line remains laterally directed with the tip probably in the mid SVC. 3. Increasing consolidation in the right lower lung field, increased small right pleural effusion. 4. Cardiomegaly with central vascular prominence without overt edema. 5. Feeding tube curves to the left and up within the stomach with the tip in the fundal area, previously was  oriented to the right in the most distal stomach. Electronically Signed   By: Almira Bar M.D.   On: 08/03/2023 21:27   DG Abd 1 View  Result Date: 08/03/2023 CLINICAL DATA:  NG placement. EXAM: ABDOMEN - 1 VIEW COMPARISON:  Abdominal radiograph dated 08/03/2023. FINDINGS: Feeding tube with tip in the right upper abdomen, likely in the distal stomach. IMPRESSION: Feeding tube with tip in the distal stomach. Electronically Signed   By: Elgie Collard M.D.   On: 08/03/2023 19:16   DG Abd Portable 1V  Result Date: 08/03/2023 CLINICAL DATA:  Feeding tube placement EXAM: PORTABLE ABDOMEN - 1 VIEW COMPARISON:  None Available. FINDINGS: Mild bibasilar atelectasis.  No pleural effusion or pneumothorax. Mild cardiomegaly. Enteric tube is  poorly visualized due to penetration. This is favored to course below the diaphragm into the stomach but is poorly visualized/evaluated. Stable left IJ central venous catheter likely abutting the right lateral aspect of the SVC. IMPRESSION: Enteric tube is poorly visualized due to penetration, favored to course below the diaphragm into the stomach, but poorly evaluated. Abdominal radiograph centered at the diaphragm is suggested to confirm current positioning. Electronically Signed   By: Charline Bills M.D.   On: 08/03/2023 17:15    Cardiac Studies     Patient Profile     67 y.o. male with morbid obesity we are consulted for new heart failure  Assessment & Plan    Acute combined heart failure: Echo 11/30 showed EF less than 20%, severe RV dysfunction.  Coox has been normal, no evidence of shock -Can fold in GDMT as able -Developed worsening AKI with diuresis, lasix held with improvement in renal function.  IV lasix restarted -Consider ischemic evaluation once recovered from acute illness  Acute respiratory failure with hypoxia/hypercapnia: Likely multifactorial with pulmonary edema and H. influenzae pneumonia, in addition to underlying OSA/OHS.  Reintubated  12/7 for aspiration pneumonia.  Extubated 12/9 but required reintubation later in the day  Paroxysmal atrial fibrillation: Had recurrent Afib with RVR on 12/9 but now back in sinus rhythm.  Appears to be maintaining sinus rhythm will convert to PO amio 200 mg BID x 2 weeks,then reduce to 200 mg daily  AKI: Renal function improving with holding diuresis, Cr 1.4 today  Hypernatremia: on free water flushes, improving  Morbid obesity: Body mass index is 66.73 kg/m.   For questions or updates, please contact [facility] Please consult [email or web address] for contact info under        Signed, Little Ishikawa, MD  08/05/2023, 11:42 AM

## 2023-08-05 NOTE — Progress Notes (Signed)
OT Cancellation Note  Patient Details Name: Levi [name] MRN: [id number] DOB: [birth year]   Cancelled Treatment:    Reason Eval/Treat Not Completed: Medical issues which prohibited therapy.  Pt currently sedated and on the vent, not appropriate for therapy services today.  Will check back later in the week and continue to monitor for appropriateness to proceed with OT treatment.   Jamael Hoffmann OTR/L 08/05/2023, 1:59 PM

## 2023-08-05 NOTE — Progress Notes (Signed)
EEG complete - results pending 

## 2023-08-06 DIAGNOSIS — I480 Paroxysmal atrial fibrillation: Secondary | ICD-10-CM | POA: Diagnosis not present

## 2023-08-06 DIAGNOSIS — I5041 Acute combined systolic (congestive) and diastolic (congestive) heart failure: Secondary | ICD-10-CM | POA: Diagnosis not present

## 2023-08-06 DIAGNOSIS — J9602 Acute respiratory failure with hypercapnia: Secondary | ICD-10-CM | POA: Diagnosis not present

## 2023-08-06 DIAGNOSIS — J9601 Acute respiratory failure with hypoxia: Secondary | ICD-10-CM | POA: Diagnosis not present

## 2023-08-06 LAB — CBC
HCT: 43.1 % (ref 39.0–52.0)
Hemoglobin: 12.7 g/dL — ABNORMAL LOW (ref 13.0–17.0)
MCH: 24.8 pg — ABNORMAL LOW (ref 26.0–34.0)
MCHC: 29.5 g/dL — ABNORMAL LOW (ref 30.0–36.0)
MCV: 84 fL (ref 80.0–100.0)
Platelets: 157 10*3/uL (ref 150–400)
RBC: 5.13 MIL/uL (ref 4.22–5.81)
RDW: 19 % — ABNORMAL HIGH (ref 11.5–15.5)
WBC: 6.4 10*3/uL (ref 4.0–10.5)
nRBC: 0 % (ref 0.0–0.2)

## 2023-08-06 LAB — GLUCOSE, CAPILLARY
Glucose-Capillary: 134 mg/dL — ABNORMAL HIGH (ref 70–99)
Glucose-Capillary: 166 mg/dL — ABNORMAL HIGH (ref 70–99)
Glucose-Capillary: 124 mg/dL — ABNORMAL HIGH (ref 70–99)
Glucose-Capillary: 137 mg/dL — ABNORMAL HIGH (ref 70–99)
Glucose-Capillary: 152 mg/dL — ABNORMAL HIGH (ref 70–99)
Glucose-Capillary: 143 mg/dL — ABNORMAL HIGH (ref 70–99)

## 2023-08-06 LAB — COMPREHENSIVE METABOLIC PANEL
ALT: 25 U/L (ref 0–44)
AST: 23 U/L (ref 15–41)
Albumin: 2.3 g/dL — ABNORMAL LOW (ref 3.5–5.0)
Alkaline Phosphatase: 62 U/L (ref 38–126)
Anion gap: 11 (ref 5–15)
BUN: 48 mg/dL — ABNORMAL HIGH (ref 8–23)
CO2: 28 mmol/L (ref 22–32)
Calcium: 8.9 mg/dL (ref 8.9–10.3)
Chloride: 106 mmol/L (ref 98–111)
Creatinine, Ser: 1.41 mg/dL — ABNORMAL HIGH (ref 0.61–1.24)
GFR, Estimated: 54 mL/min — ABNORMAL LOW (ref >60)
Glucose, Bld: 159 mg/dL — ABNORMAL HIGH (ref 70–99)
Potassium: 3.4 mmol/L — ABNORMAL LOW (ref 3.5–5.1)
Sodium: 145 mmol/L (ref 135–145)
Total Bilirubin: 1.1 mg/dL (ref <1.2)
Total Protein: 6.4 g/dL — ABNORMAL LOW (ref 6.5–8.1)

## 2023-08-06 LAB — BASIC METABOLIC PANEL
Anion gap: 9 (ref 5–15)
BUN: 46 mg/dL — ABNORMAL HIGH (ref 8–23)
CO2: 29 mmol/L (ref 22–32)
Calcium: 8.8 mg/dL — ABNORMAL LOW (ref 8.9–10.3)
Chloride: 106 mmol/L (ref 98–111)
Creatinine, Ser: 1.35 mg/dL — ABNORMAL HIGH (ref 0.61–1.24)
GFR, Estimated: 57 mL/min — ABNORMAL LOW (ref >60)
Glucose, Bld: 136 mg/dL — ABNORMAL HIGH (ref 70–99)
Potassium: 3.9 mmol/L (ref 3.5–5.1)
Sodium: 144 mmol/L (ref 135–145)

## 2023-08-06 LAB — MAGNESIUM: Magnesium: 1.8 mg/dL (ref 1.7–2.4)

## 2023-08-06 MED ORDER — MAGNESIUM SULFATE 2 GM/50ML IV SOLN
2.0000 g | Freq: Once | INTRAVENOUS | Status: AC
  Administered 2023-08-06: 2 g via INTRAVENOUS
  Filled 2023-08-06: qty 50

## 2023-08-06 MED ORDER — BANATROL TF EN LIQD
60.0000 mL | Freq: Two times a day (BID) | ENTERAL | Status: DC
Start: 2023-08-06 — End: 2023-08-08
  Administered 2023-08-06 – 2023-08-08 (×5): 60 mL
  Filled 2023-08-06 (×5): qty 60

## 2023-08-06 MED ORDER — FUROSEMIDE 10 MG/ML IJ SOLN
80.0000 mg | Freq: Four times a day (QID) | INTRAMUSCULAR | Status: AC
  Administered 2023-08-06 (×3): 80 mg via INTRAVENOUS
  Filled 2023-08-06 (×3): qty 8

## 2023-08-06 MED ORDER — POTASSIUM CHLORIDE 20 MEQ PO PACK: 40.0000 meq | PACK | Freq: Once | ORAL | Status: DC

## 2023-08-06 MED ORDER — ENOXAPARIN SODIUM 100 MG/ML IJ SOSY
90.0000 mg | PREFILLED_SYRINGE | INTRAMUSCULAR | Status: DC
  Administered 2023-08-06 – 2023-08-08 (×3): 90 mg via SUBCUTANEOUS
  Filled 2023-08-06 (×3): qty 0.9

## 2023-08-06 MED ORDER — POTASSIUM CHLORIDE 20 MEQ PO PACK
40.0000 meq | PACK | Freq: Once | ORAL | Status: AC
  Administered 2023-08-06: 40 meq
  Filled 2023-08-06: qty 2

## 2023-08-06 MED ORDER — FREE WATER
200.0000 mL | Status: DC
  Administered 2023-08-06 – 2023-08-08 (×13): 200 mL

## 2023-08-06 NOTE — Progress Notes (Signed)
Rounding Note    [name] Date of Encounter: 08/06/2023  [facility] Cardiologist: None   Subjective   Net +900 cc yesterday.  Creatinine stable at 1.4.  BP 124/71.  CVP 10.  Remains intubated and sedated  Inpatient Medications    Scheduled Meds:  amiodarone  200 mg Per Tube BID   Followed by   Melene Muller ON 08/19/2023] amiodarone  200 mg Per Tube Daily   aspirin  81 mg Per Tube Daily   Chlorhexidine Gluconate Cloth  6 each Topical Daily   enoxaparin (LOVENOX) injection  90 mg Subcutaneous Q24H   famotidine  20 mg Per Tube Daily   feeding supplement (PROSource TF20)  60 mL Per Tube QID   fiber supplement (BANATROL TF)  60 mL Per Tube BID   free water  200 mL Per Tube Q4H   furosemide  80 mg Intravenous Q6H   Gerhardt's butt cream   Topical BID   insulin aspart  0-20 Units Subcutaneous Q4H   insulin glargine-yfgn  8 Units Subcutaneous Daily   nystatin cream   Topical TID   mouth rinse  15 mL Mouth Rinse Q2H   potassium chloride  40 mEq Per Tube Once   Continuous Infusions:  feeding supplement (VITAL 1.5 CAL) 40 mL/hr at 08/06/23 0700   fentaNYL infusion INTRAVENOUS 75 mcg/hr (08/06/23 0700)   propofol (DIPRIVAN) infusion 30 mcg/kg/min (08/06/23 0703)   PRN Meds: acetaminophen **OR** acetaminophen, albuterol, fentaNYL, metoprolol tartrate, midazolam, ondansetron (ZOFRAN) IV, mouth rinse, polyethylene glycol   Vital Signs    Vitals:   08/06/23 0600 08/06/23 0700 08/06/23 0744 08/06/23 0800  BP: 132/69 124/71    Pulse: 80 75 72   Resp: (!) 26 (!) 24 (!) 24   Temp:    99.7 F (37.6 C)  TempSrc:    Axillary  SpO2: 94% 92% 90%   Weight:      Height:        Intake/Output Summary (Last 24 hours) at 08/06/2023 0856 Last data filed at 08/06/2023 0700 Gross per 24 hour  Intake 4755.41 ml  Output 3975 ml  Net 780.41 ml      08/06/2023    5:00 AM 08/05/2023    5:00 AM 08/04/2023    5:00 AM  Last 3 Weights  Weight (lbs) 429 lb 14.4  oz 426 lb 2.4 oz 428 lb 12.7 oz  Weight (kg) 195 kg 193.3 kg 194.5 kg      Telemetry     NSR - Personally Reviewed  ECG    No new ECG - Personally Reviewed  Physical Exam   GEN: Intubated and sedated Neck: Difficult to assess JVD given habitus Cardiac: RRR, no murmurs, rubs, or gallops.  Respiratory: Diminished breath sounds GI: Soft, nontender MS: No edema Neuro:  Sedated Psych: Unable to assess  Labs    High Sensitivity Troponin:   Recent Labs  Lab 07/24/23 1815 07/24/23 2326 07/25/23 0805 07/25/23 1148 08/02/23 1615  TROPONINIHS 169* 156* 371* 302* 362*     Chemistry Recent Labs  Lab 07/31/23 0430 08/01/23 0615 08/02/23 0620 08/03/23 0543 08/03/23 1938 08/05/23 0341 08/05/23 1424 08/06/23 0440  NA 149*   < > 153* 152*   < > 146* 147* 145  K 3.5   < > 4.0 3.4*   < > 3.1* 3.7 3.4*  CL 110   < > 114* 113*   < > 111 110 106  CO2 29   < > 30  30   < > 30 27 28   GLUCOSE 235*   < > 114* 152*   < > 157* 136* 159*  BUN 68*   < > 67* 58*   < > 40* 43* 48*  CREATININE 1.42*   < > 2.28* 1.70*   < > 1.38* 1.40* 1.41*  CALCIUM 8.1*   < > 8.7* 8.1*   < > 8.4* 8.9 8.9  MG  --   --  2.5* 2.4  --   --   --  1.8  PROT 5.7*  --   --   --   --   --   --  6.4*  ALBUMIN 2.1*  --   --   --   --   --   --  2.3*  AST 17  --   --   --   --   --   --  23  ALT 51*  --   --   --   --   --   --  25  ALKPHOS 56  --   --   --   --   --   --  62  BILITOT 1.2*  --   --   --   --   --   --  1.1  GFRNONAA 54*   < > 30* 43*   < > 56* 55* 54*  ANIONGAP 10   < > 9 9   < > 5 10 11    < > = values in this interval not displayed.    Lipids  Recent Labs  Lab 08/04/23 0502  TRIG 137    Hematology Recent Labs  Lab 08/04/23 0502 08/05/23 0341 08/06/23 0440  WBC 7.8 6.6 6.4  RBC 4.85 5.00 5.13  HGB 12.2* 12.5* 12.7*  HCT 42.0 42.3 43.1  MCV 86.6 84.6 84.0  MCH 25.2* 25.0* 24.8*  MCHC 29.0* 29.6* 29.5*  RDW 18.8* 19.0* 19.0*  PLT 144* 147* 157   Thyroid No results for input(s):  "TSH", "FREET4" in the last 168 hours.  BNP Recent Labs  Lab 07/31/23 0430 08/01/23 0615  BNP 329.6* 455.6*    DDimer No results for input(s): "DDIMER" in the last 168 hours.   Radiology    EEG adult Result Date: 08/05/2023 Charlsie Quest, MD     08/05/2023 12:20 PM [name] MRN: [id number] Epilepsy Attending: Charlsie Quest Referring Physician/Provider: Karren Burly, MD Date: 08/05/2023 Duration: 24.43 mins Patient history: 67 yo M with ams, had seizure like activity. EEG to evaluate for seizure Level of alertness: comatose AEDs during EEG study: Propofol Technical aspects: This EEG study was done with scalp electrodes positioned according to the 10-20 International system of electrode placement. Electrical activity was reviewed with band pass filter of 1-70Hz , sensitivity of 7 uV/mm, display speed of 2mm/sec with a 60Hz  notched filter applied as appropriate. EEG data were recorded continuously and digitally stored.  Video monitoring was available and reviewed as appropriate. Description: EEG showed continuous generalized 3 to 6 Hz theta-delta slowing admixed with 12-15hz  beta activity. Hyperventilation and photic stimulation were not performed.   ABNORMALITY - Continuous slow, generalized IMPRESSION: This study is suggestive of severe diffuse encephalopathy likely related to sedation. No seizures or epileptiform discharges were seen throughout the recording. Charlsie Quest    Cardiac Studies     Patient Profile     67 y.o. male with morbid obesity we are consulted for new heart failure  Assessment & Plan    Acute combined heart failure: Echo 11/30 showed EF less than 20%, severe RV dysfunction.  Coox has been normal, no evidence of shock -Can fold in GDMT as able -Continue IV lasix -Consider ischemic evaluation once recovered from acute illness  Acute respiratory failure with hypoxia/hypercapnia: Likely multifactorial with pulmonary edema and H.  influenzae pneumonia, in addition to underlying OSA/OHS.  Reintubated 12/7 for aspiration pneumonia.  Extubated 12/9 but required reintubation later in the day  Paroxysmal atrial fibrillation: Had recurrent Afib with RVR on 12/9 but now back in sinus rhythm.  Appears to be maintaining sinus rhythm, converted to p.o. amiodarone on 12/11.  Would continue PO amio 200 mg BID x 2 weeks,then reduce to 200 mg daily  AKI: Renal function improving with holding diuresis, Cr 1.4 today  Hypernatremia: on free water flushes, improved  Morbid obesity: Body mass index is 67.32 kg/m.   For questions or updates, please contact [facility] Please consult [email or web address] for contact info under        Signed, Little Ishikawa, MD  08/06/2023, 8:56 AM

## 2023-08-06 NOTE — Progress Notes (Signed)
NAME:  Levi [name], MRN:  [id number], DOB:  [birth year], LOS: 13 ADMISSION DATE:  07/24/2023, CONSULTATION DATE: 07/24/2023  REFERRING MD: Marlene Bast, CHIEF COMPLAINT: Shortness of breath and hypoxia  History of Present Illness:  67 year old man with morbid obesity, probably untreated OSA/OHS and knee OA who presented to ED with dry cough, DOE, SOB, LL edema, orthopnea for 1-2 weeks. Home health RN found his SpO2 64% on RA. Given a dose of Albuterol. Denied wheezing, CP, N/V/D, abd pain, f/c/r, and rash. Not on home O2 or NIV.  No smoking, illicit drug use, or alcoholism.  No hx of COPD, asthma, VTE, cardiac hx, liver disease, or CKD Developed runs of VT in ED. Admitted with biventricular failure , H. influenzae pneumonia.  Course complicated by seizures and aspiration episode  Pertinent Medical History:  History reviewed. No pertinent past medical history.  Significant Hospital Events: Including procedures, antibiotic start and stop dates in addition to other pertinent events   11/29 Admitted to [facility] 12/3 Trach aspirate sent for thick secretions. Amiodarone started for AF w/ bigeminy and trigeminy. New onset rhythmic twitching left face, LLE. Resolved spontaneously. 12/4 Seen by Neuro: myoclonic jerks noted on Ceribell monitor but not able to distinguish if seizure activity. Transferred to [facility] for LTM. Started Keppra.  Febrile. Sats 88%. LEUS negative for DVT. LTM neg for seizure so Keppra stopped later that day.  Had been started on vanc and zosyn 12/3 for fever CT was neg for stroke. Vanc stopped 12/5 sputum H flu BL positive. Change to CTX. More awake. Trying to wean sedation to assess weaning ability. Renal fxn improved after holding Lasix. 12/6 extubated , BiPAP DC'd due to vomiting 12/7 recurrent vomiting with aspiration >> reintubated 12/9 extubated, doing well.  Later in the day fell asleep while on his back became encephalopathic desaturated with SBT reintubated due to  hypoxemia and hypercarbia on blood gas  Interim History / Subjective:   UOP good, not achieving negative with BID lasix.   Objective:  Blood pressure 118/66, pulse 73, temperature 99.7 F (37.6 C), temperature source Axillary, resp. rate (!) 24, height 5' 7.01" (1.702 m), weight (!) 195 kg, SpO2 91%. CVP:  [8 mmHg-15 mmHg] 8 mmHg  Vent Mode: PRVC FiO2 (%):  [40 %] 40 % Set Rate:  [24 bmp] 24 bmp Vt Set:  [530 mL] 530 mL PEEP:  [10 cmH20] 10 cmH20 Plateau Pressure:  [15 cmH20-25 cmH20] 25 cmH20   Intake/Output Summary (Last 24 hours) at 08/06/2023 0942 Last data filed at 08/06/2023 0800 Gross per 24 hour  Intake 4745.67 ml  Output 3975 ml  Net 770.67 ml   Filed Weights   08/04/23 0500 08/05/23 0500 08/06/23 0500  Weight: (!) 194.5 kg (!) 193.3 kg (!) 195 kg   Physical Examination: General: Acute-on-chronically ill-appearing morbidly obese middle-aged man in NAD.  HEENT: [place]/AT, anicteric sclera, PERRL, moist mucous membranes. Neuro: sedated CV: S1-S2 regular, distant PULM: Bilateral distant ventilated breath sounds, no accessory muscle use GI: Obese, soft, nontender, nondistended. Hypoactive bowel sounds. Extremities: Bilateral symmetric 1+ chronic LE edema noted with ruddy erythema overlying. Skin: Warm/dry, no rashes.  Labs and images reviewed  Assessment & Plan:   Acute-on-chronic systolic heart failure Echo from 11/30 with EF <20%, G1DD, severely reduced RVSF, severely enlarged RV - Cardiology following, appreciate assistance; considering R/LHC when more stable - Cardiac monitoring/tele - Increase Lasix to thrice daily dosing 12/11  Acute Hypoxic and Hypercapnic respiratory failure -secondary to pulmonary edema and complicated further  by BL positive Haemophilus influenza PNA (12/3) , reintubated 12/7 for aspiration pneumonia, possible exacerbation of hypercapnia due to pulmonary edema, reintubated 12/9 for hypercapnia Presumed OSA/OHS - initially Zosyn x 3 days  transitioned to ceftriaxone , plan for 7 total days from aspiration event 12/7 -Extubated re-intubated 12/9 after fell asleep lying flat on back in ICU bed with hypoxemia and hypercarbia -PRVC FiO2 80% PEEP of 12 sat 100%, wean as tolerated -VAP bundle stressors or prophylaxis -Increase intensity diuresis  Ileus/vomiting: Possible ileus on abdominal film -Reglan for 6 doses -- Adequate stooling now with FMS in place  New onset AF w/ intermittent ventricular ectopy, CHA2DS2/VASC score 2.  Bigeminy/trigeminy VT Started on amio and heparin 12/3.  - Cardiac monitoring - Optimize electrolytes as able - continue amio oral, stop the gtt. to minimize fluids as well as was in sinus rhythm, hold heparin mild bleeding in suction tubing  Acute metabolic encephalopathy  Improved.  Myoclonic jerks attributed to uremia and sedation Possible seizure. Non witnessed by LTM. Felt metabolic in nature. CT head obtained and it was reassuring w/out new clear stroke/injury - Monitor off of AEDs - Keppra discontinued 12/4 -- Spot EEG 12/11 with left shoulder shivering negative for seizure  Acute kidney injury w/ rising Cr in context of on-going diuresis  Renal fxn improved w/ holding lasix. - Trend BMP, Cr improving - Replete electrolytes as indicated - Monitor I&Os, goal euvolemia - Avoid nephrotoxic agents as able  Hypernatremia: Improving -free water via TF, decrease 12/ 12 to minimize fluid intake  Congestive hepatopathy, improving - Supportive care - Intermittent LFT monitoring with amio  Uncontrolled diabetes, now hypoglycemic while n.p.o. - SSI - Goal CBG 140-180 - Semglee 8 units daily  Morbid Obesity - Counseling re: weight loss/lifestyle changes  Best Practice: (right click and "Reselect all SmartList Selections" daily)   Diet/type: tubefeeds DVT prophylaxis: IV heparin on hold mild bleeding ET tube  Pressure ulcer(s): not present on admission  GI prophylaxis: H2B Lines: Central  line Foley:  Yes, and it is still needed Code Status:  full code Last date of multidisciplinary goals of care discussion [See IPAL 12/11]  Critical care time:    CRITICAL CARE Performed by: Karren Burly   Total critical care time: 31 minutes  Critical care time was exclusive of separately billable procedures and treating other patients.  Critical care was necessary to treat or prevent imminent or life-threatening deterioration.  Critical care was time spent personally by me on the following activities: development of treatment plan with patient and/or surrogate as well as nursing, discussions with consultants, evaluation of patient's response to treatment, examination of patient, obtaining history from patient or surrogate, ordering and performing treatments and interventions, ordering and review of laboratory studies, ordering and review of radiographic studies, pulse oximetry and re-evaluation of patient's condition. Karren Burly, MD [place] [facility] 08/06/23 9:42 AM  Please see [email or web address] for pager details.  From 7A-7P if no response, please call [phone] After hours, please call ELink [phone]

## 2023-08-06 NOTE — Progress Notes (Signed)
[facility] ADULT ICU REPLACEMENT PROTOCOL   The patient does apply for the [facility] Adult ICU Electrolyte Replacment Protocol based on the criteria listed below:   1.Exclusion criteria: TCTS, ECMO, Dialysis, and Myasthenia Gravis patients 2. Is GFR >/= 30 ml/min? Yes.    Patient's GFR today is 54 3. Is SCr </= 2? Yes.   Patient's SCr is 1.41 mg/dL 4. Did SCr increase >/= 0.5 in 24 hours? No. 5.Pt's weight >40kg  Yes.   6. Abnormal electrolyte(s): K+ = 3.4, Mg = 1.8  7. Electrolytes replaced per protocol 8.  Call MD STAT for K+ </= 2.5, Phos </= 1, or Mag </= 1 Physician:  Warrick Parisian, eMD  Suzan Slick Angelee Bahr 08/06/2023 6:06 AM

## 2023-08-06 NOTE — Progress Notes (Signed)
PT Cancellation Note  Patient Details Name: Levi [name] MRN: [id number] DOB: July 08, 1956   Cancelled Treatment:    Reason Eval/Treat Not Completed: Fatigue/lethargy limiting ability to participate;Patient not medically ready; per RN patient not able to participate.  Will follow up again on Monday 08/10/23 for medical readiness.    [name] 08/06/2023, 3:13 PM Sheran Lawless, PT Acute Rehabilitation Services Office:[phone] 08/06/2023

## 2023-08-07 DIAGNOSIS — N179 Acute kidney failure, unspecified: Secondary | ICD-10-CM | POA: Diagnosis not present

## 2023-08-07 DIAGNOSIS — I272 Pulmonary hypertension, unspecified: Secondary | ICD-10-CM | POA: Diagnosis not present

## 2023-08-07 DIAGNOSIS — J9602 Acute respiratory failure with hypercapnia: Secondary | ICD-10-CM | POA: Diagnosis not present

## 2023-08-07 DIAGNOSIS — I5023 Acute on chronic systolic (congestive) heart failure: Secondary | ICD-10-CM

## 2023-08-07 DIAGNOSIS — I480 Paroxysmal atrial fibrillation: Secondary | ICD-10-CM | POA: Diagnosis not present

## 2023-08-07 DIAGNOSIS — J9601 Acute respiratory failure with hypoxia: Secondary | ICD-10-CM | POA: Diagnosis not present

## 2023-08-07 DIAGNOSIS — I5041 Acute combined systolic (congestive) and diastolic (congestive) heart failure: Secondary | ICD-10-CM | POA: Diagnosis not present

## 2023-08-07 LAB — GLUCOSE, CAPILLARY
Glucose-Capillary: 121 mg/dL — ABNORMAL HIGH (ref 70–99)
Glucose-Capillary: 121 mg/dL — ABNORMAL HIGH (ref 70–99)
Glucose-Capillary: 138 mg/dL — ABNORMAL HIGH (ref 70–99)
Glucose-Capillary: 147 mg/dL — ABNORMAL HIGH (ref 70–99)
Glucose-Capillary: 171 mg/dL — ABNORMAL HIGH (ref 70–99)
Glucose-Capillary: 144 mg/dL — ABNORMAL HIGH (ref 70–99)

## 2023-08-07 LAB — MAGNESIUM: Magnesium: 1.8 mg/dL (ref 1.7–2.4)

## 2023-08-07 LAB — CBC
HCT: 41.2 % (ref 39.0–52.0)
Hemoglobin: 12.4 g/dL — ABNORMAL LOW (ref 13.0–17.0)
MCH: 25 pg — ABNORMAL LOW (ref 26.0–34.0)
MCHC: 30.1 g/dL (ref 30.0–36.0)
MCV: 83.1 fL (ref 80.0–100.0)
Platelets: 181 10*3/uL (ref 150–400)
RBC: 4.96 MIL/uL (ref 4.22–5.81)
RDW: 19.3 % — ABNORMAL HIGH (ref 11.5–15.5)
WBC: 6.3 10*3/uL (ref 4.0–10.5)
nRBC: 0 % (ref 0.0–0.2)

## 2023-08-07 LAB — BASIC METABOLIC PANEL
Anion gap: 10 (ref 5–15)
BUN: 53 mg/dL — ABNORMAL HIGH (ref 8–23)
CO2: 29 mmol/L (ref 22–32)
Calcium: 9.1 mg/dL (ref 8.9–10.3)
Chloride: 104 mmol/L (ref 98–111)
Creatinine, Ser: 1.31 mg/dL — ABNORMAL HIGH (ref 0.61–1.24)
GFR, Estimated: 59 mL/min — ABNORMAL LOW (ref >60)
Glucose, Bld: 155 mg/dL — ABNORMAL HIGH (ref 70–99)
Potassium: 3.6 mmol/L (ref 3.5–5.1)
Sodium: 143 mmol/L (ref 135–145)

## 2023-08-07 LAB — TRIGLYCERIDES: Triglycerides: 234 mg/dL — ABNORMAL HIGH (ref <150)

## 2023-08-07 MED ORDER — FUROSEMIDE 10 MG/ML IJ SOLN
80.0000 mg | Freq: Four times a day (QID) | INTRAMUSCULAR | Status: AC
  Administered 2023-08-07 (×3): 80 mg via INTRAVENOUS
  Filled 2023-08-07 (×3): qty 8

## 2023-08-07 MED ORDER — POTASSIUM CHLORIDE 20 MEQ PO PACK
40.0000 meq | PACK | Freq: Once | ORAL | Status: AC
  Administered 2023-08-07: 40 meq
  Filled 2023-08-07: qty 2

## 2023-08-07 MED ORDER — MAGNESIUM SULFATE 2 GM/50ML IV SOLN
2.0000 g | Freq: Once | INTRAVENOUS | Status: AC
  Administered 2023-08-07: 2 g via INTRAVENOUS
  Filled 2023-08-07: qty 50

## 2023-08-07 NOTE — Progress Notes (Signed)
Rounding Note    Patient Name: BENINO HED Date of Encounter: 08/07/2023  [facility] Cardiologist: None   Subjective   Net -1.2L yesterday, negative 7L on admission.  Creatinine stable at 1.3.  BP 110/69.  CVP 11.  Remains intubated and sedated  Inpatient Medications    Scheduled Meds:  amiodarone  200 mg Per Tube BID   Followed by   Melene Muller ON 08/19/2023] amiodarone  200 mg Per Tube Daily   aspirin  81 mg Per Tube Daily   Chlorhexidine Gluconate Cloth  6 each Topical Daily   enoxaparin (LOVENOX) injection  90 mg Subcutaneous Q24H   famotidine  20 mg Per Tube Daily   feeding supplement (PROSource TF20)  60 mL Per Tube QID   fiber supplement (BANATROL TF)  60 mL Per Tube BID   free water  200 mL Per Tube Q4H   furosemide  80 mg Intravenous Q6H   Gerhardt's butt cream   Topical BID   insulin aspart  0-20 Units Subcutaneous Q4H   insulin glargine-yfgn  8 Units Subcutaneous Daily   nystatin cream   Topical TID   mouth rinse  15 mL Mouth Rinse Q2H   Continuous Infusions:  feeding supplement (VITAL 1.5 CAL) 50 mL/hr at 08/07/23 0900   fentaNYL infusion INTRAVENOUS 75 mcg/hr (08/07/23 0900)   propofol (DIPRIVAN) infusion 30 mcg/kg/min (08/07/23 0900)   PRN Meds: acetaminophen **OR** acetaminophen, albuterol, fentaNYL, metoprolol tartrate, midazolam, ondansetron (ZOFRAN) IV, mouth rinse, polyethylene glycol   Vital Signs    Vitals:   08/07/23 0700 08/07/23 0721 08/07/23 0800 08/07/23 0900  BP: 122/66  112/65 110/69  Pulse: 78 76 72 68  Resp: (!) 24 (!) 24 (!) 24 (!) 24  Temp:   98.2 F (36.8 C)   TempSrc:   Oral   SpO2: 93% 92% 93% 91%  Weight:      Height:        Intake/Output Summary (Last 24 hours) at 08/07/2023 1015 Last data filed at 08/07/2023 0900 Gross per 24 hour  Intake 2979.2 ml  Output 3250 ml  Net -270.8 ml      08/07/2023    5:00 AM 08/06/2023    5:00 AM 08/05/2023    5:00 AM  Last 3 Weights  Weight (lbs) 427 lb 7.6 oz 429 lb  14.4 oz 426 lb 2.4 oz  Weight (kg) 193.9 kg 195 kg 193.3 kg      Telemetry     NSR - Personally Reviewed  ECG    No new ECG - Personally Reviewed  Physical Exam   GEN: Intubated and sedated Neck: Difficult to assess JVD given habitus Cardiac: RRR, no murmurs, rubs, or gallops.  Respiratory: Diminished breath sounds GI: Soft, nontender MS: No edema Neuro:  Sedated Psych: Unable to assess  Labs    High Sensitivity Troponin:   Recent Labs  Lab 07/24/23 1815 07/24/23 2326 07/25/23 0805 07/25/23 1148 08/02/23 1615  TROPONINIHS 169* 156* 371* 302* 362*     Chemistry Recent Labs  Lab 08/03/23 0543 08/03/23 1938 08/06/23 0440 08/06/23 1643 08/07/23 0451  NA 152*   < > 145 144 143  K 3.4*   < > 3.4* 3.9 3.6  CL 113*   < > 106 106 104  CO2 30   < > 28 29 29   GLUCOSE 152*   < > 159* 136* 155*  BUN 58*   < > 48* 46* 53*  CREATININE 1.70*   < > 1.41*  1.35* 1.31*  CALCIUM 8.1*   < > 8.9 8.8* 9.1  MG 2.4  --  1.8  --  1.8  PROT  --   --  6.4*  --   --   ALBUMIN  --   --  2.3*  --   --   AST  --   --  23  --   --   ALT  --   --  25  --   --   ALKPHOS  --   --  62  --   --   BILITOT  --   --  1.1  --   --   GFRNONAA 43*   < > 54* 57* 59*  ANIONGAP 9   < > 11 9 10    < > = values in this interval not displayed.    Lipids  Recent Labs  Lab 08/07/23 0451  TRIG 234*    Hematology Recent Labs  Lab 08/05/23 0341 08/06/23 0440 08/07/23 0451  WBC 6.6 6.4 6.3  RBC 5.00 5.13 4.96  HGB 12.5* 12.7* 12.4*  HCT 42.3 43.1 41.2  MCV 84.6 84.0 83.1  MCH 25.0* 24.8* 25.0*  MCHC 29.6* 29.5* 30.1  RDW 19.0* 19.0* 19.3*  PLT 147* 157 181   Thyroid No results for input(s): "TSH", "FREET4" in the last 168 hours.  BNP Recent Labs  Lab 08/01/23 0615  BNP 455.6*    DDimer No results for input(s): "DDIMER" in the last 168 hours.   Radiology    EEG adult Result Date: 08/05/2023 Charlsie Quest, MD     08/05/2023 12:20 PM Patient Name: MACHAEL GHOLAR MRN: [id number]  Epilepsy Attending: Charlsie Quest Referring Physician/Provider: Karren Burly, MD Date: 08/05/2023 Duration: 24.43 mins Patient history: 67 yo M with ams, had seizure like activity. EEG to evaluate for seizure Level of alertness: comatose AEDs during EEG study: Propofol Technical aspects: This EEG study was done with scalp electrodes positioned according to the 10-20 International system of electrode placement. Electrical activity was reviewed with band pass filter of 1-70Hz , sensitivity of 7 uV/mm, display speed of 7mm/sec with a 60Hz  notched filter applied as appropriate. EEG data were recorded continuously and digitally stored.  Video monitoring was available and reviewed as appropriate. Description: EEG showed continuous generalized 3 to 6 Hz theta-delta slowing admixed with 12-15hz  beta activity. Hyperventilation and photic stimulation were not performed.   ABNORMALITY - Continuous slow, generalized IMPRESSION: This study is suggestive of severe diffuse encephalopathy likely related to sedation. No seizures or epileptiform discharges were seen throughout the recording. Charlsie Quest    Cardiac Studies     Patient Profile     67 y.o. male with morbid obesity we are consulted for new heart failure  Assessment & Plan    Acute combined heart failure: Echo 11/30 showed EF less than 20%, severe RV dysfunction.  Coox has been normal, no evidence of shock -Can fold in GDMT as able -Continue IV lasix -Consider ischemic evaluation once recovered from acute illness  Acute respiratory failure with hypoxia/hypercapnia: Likely multifactorial with pulmonary edema and H. influenzae pneumonia, in addition to underlying OSA/OHS.  Reintubated 12/7 for aspiration pneumonia.  Extubated 12/9 but required reintubation later in the day  Paroxysmal atrial fibrillation: Had recurrent Afib with RVR on 12/9 but now back in sinus rhythm.  Appears to be maintaining sinus rhythm, converted to p.o. amiodarone  on 12/11.  Would continue PO amio 200 mg BID x 2 weeks,then reduce  to 200 mg daily.  Heparin gtt held due to blood in suction tubing  AKI: Renal function improving, Cr 1.3 today  Morbid obesity: Body mass index is 66.94 kg/m.   For questions or updates, please contact  HeartCare Please consult [email or web address] for contact info under        Signed, Little Ishikawa, MD  08/07/2023, 10:15 AM

## 2023-08-07 NOTE — Plan of Care (Signed)
  Problem: Fluid Volume: Goal: Ability to maintain a balanced intake and output will improve Outcome: Progressing   Problem: Nutritional: Goal: Maintenance of adequate nutrition will improve Outcome: Progressing   Problem: Education: Goal: Ability to describe self-care measures that may prevent or decrease complications (Diabetes Survival Skills Education) will improve Outcome: Not Progressing   Problem: Health Behavior/Discharge Planning: Goal: Ability to manage health-related needs will improve Outcome: Not Progressing

## 2023-08-07 NOTE — Progress Notes (Signed)
NAME:  Levi [name], MRN:  [id number], DOB:  [birth year], LOS: 14 ADMISSION DATE:  07/24/2023, CONSULTATION DATE: 07/24/2023  REFERRING MD: Marlene Bast, CHIEF COMPLAINT: Shortness of breath and hypoxia  History of Present Illness:  67 year old man with morbid obesity, probably untreated OSA/OHS and knee OA who presented to ED with dry cough, DOE, SOB, LL edema, orthopnea for 1-2 weeks. Home health RN found his SpO2 64% on RA. Given a dose of Albuterol. Denied wheezing, CP, N/V/D, abd pain, f/c/r, and rash. Not on home O2 or NIV.  No smoking, illicit drug use, or alcoholism.  No hx of COPD, asthma, VTE, cardiac hx, liver disease, or CKD Developed runs of VT in ED. Admitted with biventricular failure , H. influenzae pneumonia.  Course complicated by seizures and aspiration episode  Pertinent Medical History:  History reviewed. No pertinent past medical history.  Significant Hospital Events: Including procedures, antibiotic start and stop dates in addition to other pertinent events   11/29 Admitted to [facility] 12/3 Trach aspirate sent for thick secretions. Amiodarone started for AF w/ bigeminy and trigeminy. New onset rhythmic twitching left face, LLE. Resolved spontaneously. 12/4 Seen by Neuro: myoclonic jerks noted on Ceribell monitor but not able to distinguish if seizure activity. Transferred to [facility] for LTM. Started Keppra.  Febrile. Sats 88%. LEUS negative for DVT. LTM neg for seizure so Keppra stopped later that day.  Had been started on vanc and zosyn 12/3 for fever CT was neg for stroke. Vanc stopped 12/5 sputum H flu BL positive. Change to CTX. More awake. Trying to wean sedation to assess weaning ability. Renal fxn improved after holding Lasix. 12/6 extubated , BiPAP DC'd due to vomiting 12/7 recurrent vomiting with aspiration >> reintubated 12/9 extubated, doing well.  Later in the day fell asleep while on his back became encephalopathic desaturated with SBT reintubated due to  hypoxemia and hypercarbia on blood gas  Interim History / Subjective:   Continues to require diuresis  Objective:  Blood pressure 112/65, pulse 72, temperature 98.9 F (37.2 C), temperature source Axillary, resp. rate (!) 24, height 5' 7.01" (1.702 m), weight (!) 193.9 kg, SpO2 93%. CVP:  [6 mmHg-11 mmHg] 11 mmHg  Vent Mode: PRVC FiO2 (%):  [40 %] 40 % Set Rate:  [24 bmp] 24 bmp Vt Set:  [530 mL] 530 mL PEEP:  [10 cmH20] 10 cmH20 Plateau Pressure:  [21 cmH20-26 cmH20] 24 cmH20   Intake/Output Summary (Last 24 hours) at 08/07/2023 0901 Last data filed at 08/07/2023 0800 Gross per 24 hour  Intake 2851.34 ml  Output 3700 ml  Net -848.66 ml   Filed Weights   08/05/23 0500 08/06/23 0500 08/07/23 0500  Weight: (!) 193.3 kg (!) 195 kg (!) 193.9 kg   Physical Examination: Gen:      Intubated, sedated, acutely ill appearing HEENT:  ETT to vent Lungs:    sounds of mechanical ventilation auscultated, thick secretions, no wheeze CV:         diminished, RRR Abd:      Distended, soft Ext:  Diffuse anasarca upper and lower extremities Skin:      Warm and dry; no rashes Neuro:   sedated, RASS -3  Assessment & Plan:   Acute Hypoxic and Hypercapnic respiratory failure -secondary to pulmonary edema and complicated further by BL positive Haemophilus influenza PNA (12/3) , reintubated 12/7 for aspiration pneumonia, possible exacerbation of hypercapnia due to pulmonary edema, reintubated 12/9 for hypercapnia Presumed OSA/OHS - initially Zosyn x 3 days transitioned  to ceftriaxone , plan for 7 total days from aspiration event 12/7 - wean vent settings as tolerated.  -VAP bundle stressors or prophylaxis - continue net negative goals with diuresis, given family goals and complex patient condition, plan is for one way extubation once medically optimixed.   Acute-on-chronic systolic heart failure Echo from 11/30 with EF <20%, G1DD, severely reduced RVSF, severely enlarged RV - Cardiology  following, appreciate assistance; considering R/LHC when more stable - Cardiac monitoring/tele - continue lasix 80 mg TID  New onset AF w/ intermittent ventricular ectopy, CHA2DS2/VASC score 2.  Bigeminy/trigeminy VT Started on amio and heparin 12/3.  - Cardiac monitoring - Optimize electrolytes as able. Supplement K today - continue amio oral, stop the gtt. to minimize fluids as well as was in sinus rhythm, hold heparin mild bleeding in suction tubing  Acute kidney injury w/ rising Cr in context of on-going diuresis  Renal fxn improved w/ holding lasix. - Trend BMP, Cr improving - Replete electrolytes as indicated - Monitor I&Os, goal euvolemia - Avoid nephrotoxic agents as able  Acute metabolic encephalopathy  Improved.  Myoclonic jerks attributed to uremia and sedation Possible seizure. Non witnessed by LTM. Felt metabolic in nature. CT head obtained and it was reassuring w/out new clear stroke/injury - Monitor off of AEDs - Keppra discontinued 12/4 -- Spot EEG 12/11 with left shoulder shivering negative for seizure  Uncontrolled diabetes, now hypoglycemic while n.p.o. - SSI - Goal CBG 140-180 -- semglee  Ileus/vomiting: Possible ileus on abdominal film -Reglan for 6 doses -- Adequate stooling now with FMS in place\  Hypernatremia: Improving -free water via TF, decrease 12/ 12 to minimize fluid intake  Congestive hepatopathy, improving - Supportive care - Intermittent LFT monitoring with amio  Morbid Obesity - Counseling re: weight loss/lifestyle changes  Best Practice: (right click and "Reselect all SmartList Selections" daily)   Diet/type: tubefeeds DVT prophylaxis: IV heparin on hold mild bleeding ET tube  Pressure ulcer(s): not present on admission  GI prophylaxis: H2B Lines: Central line Foley:  Yes, and it is still needed Code Status:  DNR Last date of multidisciplinary goals of care discussion [See IPAL 12/11. No plans for long term critical illness.  Will update family again today. ]  Critical care time:    The patient is critically ill due to respiratory failure, encephalopathy.  Critical care was necessary to treat or prevent imminent or life-threatening deterioration.  Critical care was time spent personally by me on the following activities: development of treatment plan with patient and/or surrogate as well as nursing, discussions with consultants, evaluation of patient's response to treatment, examination of patient, obtaining history from patient or surrogate, ordering and performing treatments and interventions, ordering and review of laboratory studies, ordering and review of radiographic studies, pulse oximetry, re-evaluation of patient's condition and participation in multidisciplinary rounds.   Critical Care Time devoted to patient care services described in this note is 35 minutes. This time reflects time of care of this signee Charlott Holler . This critical care time does not reflect separately billable procedures or procedure time, teaching time or supervisory time of PA/NP/Med student/Med Resident etc but could involve care discussion time.       Charlott Holler [facility] 08/07/2023 9:02 AM  Pager: see AMION  If no response to pager , please call critical care on call (see AMION) until 7pm After 7:00 pm call Elink

## 2023-08-07 NOTE — Progress Notes (Signed)
eLink Physician-Brief Progress Note Patient Name: PACKER PAMPLONA DOB: [birth year] MRN: [id number]   Date of Service  08/07/2023  HPI/Events of Note  Patient is on the ventilator and needs restraints ordered.  eICU Interventions  Bilateral soft wrist restraints ordered.        Sheral Pfahler U Tamotsu Wiederholt 08/07/2023, 1:28 AM

## 2023-08-07 NOTE — Progress Notes (Signed)
[facility] ADULT ICU REPLACEMENT PROTOCOL   The patient does apply for the [facility] Adult ICU Electrolyte Replacment Protocol based on the criteria listed below:   1.Exclusion criteria: TCTS, ECMO, Dialysis, and Myasthenia Gravis patients 2. Is GFR >/= 30 ml/min? Yes.    Patient's GFR today is 59 3. Is SCr </= 2? Yes.   Patient's SCr is 1.31 mg/dL 4. Did SCr increase >/= 0.5 in 24 hours? No. 5.Pt's weight >40kg  Yes.   6. Abnormal electrolyte(s): K+ = 3.6, Mg = 1.8  7. Electrolytes replaced per protocol 8.  Call MD STAT for K+ </= 2.5, Phos </= 1, or Mag </= 1 Physician:  Warrick Parisian, eMD  Suzan Slick Marise Knapper 08/07/2023 6:06 AM

## 2023-08-07 NOTE — TOC Progression Note (Signed)
Transition of Care [facility]) - Progression Note    Patient Details  Name: Levi [name] MRN: [id number] Date of Birth: 08-15-1956  Transition of Care [facility]) CM/SW Contact  Mearl Latin, LCSW Phone Number: 08/07/2023, 4:54 PM  Clinical Narrative:    TOC continues to follow. Patient remains intubated.      Barriers to Discharge: Continued Medical Work up  Expected Discharge Plan and Services   Discharge Planning Services: CM Consult   Living arrangements for the past 2 months: Single Family Home                                       Social Determinants of Health (SDOH) Interventions SDOH Screenings   Food Insecurity: Patient Unable To Answer (07/25/2023)  Housing: Patient Unable To Answer (07/25/2023)  Transportation Needs: Patient Unable To Answer (07/25/2023)  Utilities: Patient Unable To Answer (07/25/2023)  Tobacco Use: Low Risk  (07/24/2023)    Readmission Risk Interventions     No data to display

## 2023-08-08 DIAGNOSIS — N179 Acute kidney failure, unspecified: Secondary | ICD-10-CM | POA: Diagnosis not present

## 2023-08-08 DIAGNOSIS — J9602 Acute respiratory failure with hypercapnia: Secondary | ICD-10-CM | POA: Diagnosis not present

## 2023-08-08 DIAGNOSIS — J9601 Acute respiratory failure with hypoxia: Secondary | ICD-10-CM | POA: Diagnosis not present

## 2023-08-08 DIAGNOSIS — I272 Pulmonary hypertension, unspecified: Secondary | ICD-10-CM | POA: Diagnosis not present

## 2023-08-08 LAB — CBC
HCT: 42.9 % (ref 39.0–52.0)
Hemoglobin: 12.7 g/dL — ABNORMAL LOW (ref 13.0–17.0)
MCH: 24.9 pg — ABNORMAL LOW (ref 26.0–34.0)
MCHC: 29.6 g/dL — ABNORMAL LOW (ref 30.0–36.0)
MCV: 84 fL (ref 80.0–100.0)
Platelets: 242 10*3/uL (ref 150–400)
RBC: 5.11 MIL/uL (ref 4.22–5.81)
RDW: 19.5 % — ABNORMAL HIGH (ref 11.5–15.5)
WBC: 6 10*3/uL (ref 4.0–10.5)
nRBC: 0 % (ref 0.0–0.2)

## 2023-08-08 LAB — BASIC METABOLIC PANEL
Anion gap: 10 (ref 5–15)
BUN: 63 mg/dL — ABNORMAL HIGH (ref 8–23)
CO2: 29 mmol/L (ref 22–32)
Calcium: 9 mg/dL (ref 8.9–10.3)
Chloride: 103 mmol/L (ref 98–111)
Creatinine, Ser: 1.44 mg/dL — ABNORMAL HIGH (ref 0.61–1.24)
GFR, Estimated: 53 mL/min — ABNORMAL LOW (ref >60)
Glucose, Bld: 189 mg/dL — ABNORMAL HIGH (ref 70–99)
Potassium: 4.4 mmol/L (ref 3.5–5.1)
Sodium: 142 mmol/L (ref 135–145)

## 2023-08-08 LAB — GLUCOSE, CAPILLARY
Glucose-Capillary: 176 mg/dL — ABNORMAL HIGH (ref 70–99)
Glucose-Capillary: 162 mg/dL — ABNORMAL HIGH (ref 70–99)

## 2023-08-08 MED ORDER — LORAZEPAM 2 MG/ML IJ SOLN
2.0000 mg | INTRAMUSCULAR | Status: DC | PRN
  Administered 2023-08-08: 4 mg via INTRAVENOUS
  Filled 2023-08-08: qty 2

## 2023-08-08 MED ORDER — POLYVINYL ALCOHOL 1.4 % OP SOLN: 1.0000 [drp] | Freq: Four times a day (QID) | OPHTHALMIC | Status: DC | PRN

## 2023-08-08 MED ORDER — GLYCOPYRROLATE 0.2 MG/ML IJ SOLN: 0.2000 mg | INTRAMUSCULAR | Status: DC | PRN

## 2023-08-08 MED ORDER — SODIUM CHLORIDE 0.9% FLUSH
10.0000 mL | Freq: Two times a day (BID) | INTRAVENOUS | Status: DC
Start: 2023-08-08 — End: 2023-08-08
  Administered 2023-08-08: 10 mL via INTRAVENOUS

## 2023-08-08 MED ORDER — ACETAMINOPHEN 650 MG RE SUPP: 650.0000 mg | Freq: Four times a day (QID) | RECTAL | Status: DC | PRN

## 2023-08-08 MED ORDER — GLYCOPYRROLATE 0.2 MG/ML IJ SOLN
0.2000 mg | INTRAMUSCULAR | Status: DC | PRN
  Administered 2023-08-08: 0.2 mg via INTRAVENOUS
  Filled 2023-08-08: qty 1

## 2023-08-08 MED ORDER — MORPHINE BOLUS VIA INFUSION: 5.0000 mg | INTRAVENOUS | Status: DC | PRN

## 2023-08-08 MED ORDER — ACETAMINOPHEN 325 MG PO TABS: 650.0000 mg | ORAL_TABLET | Freq: Four times a day (QID) | ORAL | Status: DC | PRN

## 2023-08-08 MED ORDER — MORPHINE 100MG IN NS 100ML (1MG/ML) PREMIX INFUSION
0.0000 mg/h | INTRAVENOUS | Status: DC
  Administered 2023-08-08: 5 mg/h via INTRAVENOUS
  Filled 2023-08-08: qty 100

## 2023-08-08 MED ORDER — GLYCOPYRROLATE 1 MG PO TABS: 1.0000 mg | ORAL_TABLET | ORAL | Status: DC | PRN

## 2023-08-26 NOTE — Progress Notes (Signed)
Patient extubated to comfort care with family and RN at the bedside. CCM notified.

## 2023-08-26 NOTE — Progress Notes (Addendum)
Patient extubated to comfort care. Patient with absent vital signs at 1147-no heart sounds auscultated, no spontaneous breaths noted, no pulses, pupils fixed and non-reactive. TOD pronounced at 1147 by this RN and Samule Ohm. Dr. Celine Mans aware. Patient's spouse, Graciella Belton, at the bedside. Unsure what funeral home will be used at this time. Spouse given appropriate number to contact when ready. Dentures taken home.     1:12 PM  50cc Morphine wasted by this RN and witnessed by Freada Bergeron RN in med room.

## 2023-08-26 NOTE — Death Summary Note (Signed)
DEATH SUMMARY   Patient Details  Name: Levi [name] MRN: [id number] DOB: Mar 31, 1956  Admission/Discharge Information   Admit Date:  08-17-2023  Date of Death: Date of Death: 09/01/23  Time of Death: Time of Death: Sep 09, 1146  Length of Stay: 2023/09/02  Referring Physician: Patient, No Pcp Per   Reason(s) for Hospitalization  Hypoxemic respiratory failure  Diagnoses  Preliminary cause of death: Congestive Heart failure Secondary Diagnoses (including complications and co-morbidities):  Principal Problem:   CHF (congestive heart failure) (HCC) Active Problems:   Acute respiratory failure with hypoxia (HCC)   Elevated troponin   AKI (acute kidney injury) (HCC)   Hyperkalemia   Aortic atherosclerosis (HCC)   Pulmonary hypertension (HCC)   Acute heart failure with reduced ejection fraction (HFrEF, <= 40%) (HCC)   Cardiomyopathy (HCC)   Acute renal insufficiency   Hypokalemia   Acute hypercapnic respiratory failure (HCC)   Biventricular heart failure, NYHA class 3 (HCC)   PAF (paroxysmal atrial fibrillation) (HCC)   Biventricular heart failure (HCC)   Pneumonia of both lungs due to infectious organism   Metabolic encephalopathy   Atherosclerosis of aorta (HCC)   Pressure injury of skin   Brief Hospital Course (including significant findings, care, treatment, and services provided and events leading to death)  Barbee Cough wa a 68 y.o. year old male with morbid obesity, probably untreated OSA/OHS and knee OA who presented to ED with dry cough, DOE, SOB, LL edema, orthopnea for 1-2 weeks. Home health RN found his SpO2 64% on RA. Given a dose of Albuterol. Denied wheezing, CP, N/V/D, abd pain, f/c/r, and rash. Not on home O2 or NIV.  No smoking, illicit drug use, or alcoholism.  No hx of COPD, asthma, VTE, cardiac hx, liver disease, or CKD Developed runs of VT in ED. Admitted with biventricular failure , H. influenzae pneumonia.  Course complicated by seizures and aspiration episode.    Also significant cardiomyopathy with decompensated HFpEF requiring diuresis, complicated by renal failure. Despite mechanical ventilation support, antibiotics for pneumonia and diuresis, he was extuabted and then intubated three times total. Ultimately became profoundly weak after 2 week hospital stay. Discussed goals of care with family and he would not have wanted prolonged critical illness support with tracheostomy. Decision was made to transition to comfort measures on 09/01/2023. He passed away peacefully with family at bedside.      Pertinent Labs and Studies  Significant Diagnostic Studies EEG adult Result Date: 08/05/2023 Charlsie Quest, MD     08/05/2023 12:20 PM [name] MRN: [id number] Epilepsy Attending: Charlsie Quest Referring Physician/Provider: Karren Burly, MD Date: 08/05/2023 Duration: 24.43 mins Patient history: 68 yo M with ams, had seizure like activity. EEG to evaluate for seizure Level of alertness: comatose AEDs during EEG study: Propofol Technical aspects: This EEG study was done with scalp electrodes positioned according to the 10-20 International system of electrode placement. Electrical activity was reviewed with band pass filter of 1-70Hz , sensitivity of 7 uV/mm, display speed of 96mm/sec with a 60Hz  notched filter applied as appropriate. EEG data were recorded continuously and digitally stored.  Video monitoring was available and reviewed as appropriate. Description: EEG showed continuous generalized 3 to 6 Hz theta-delta slowing admixed with 12-15hz  beta activity. Hyperventilation and photic stimulation were not performed.   ABNORMALITY - Continuous slow, generalized IMPRESSION: This study is suggestive of severe diffuse encephalopathy likely related to sedation. No seizures or epileptiform discharges were seen throughout the recording. Priyanka Annabelle Harman  Portable Chest x-ray Result Date: 08/03/2023 CLINICAL DATA:  [id number], endotracheal tube  present. [id number], encounter for orogastric tube placement. EXAM: PORTABLE CHEST 1 VIEW PORTABLE ABDOMEN 1 VIEW COMPARISON:  Portable chest yesterday at 6:53 a.m., portable abdomen today at 5:49 p.m. FINDINGS: Chest AP portable 8:25 p.m.: ETT tip is 1.8 cm from the carina. The patient is again rotated to the right. Left IJ central line remains laterally directed with the tip probably in the mid SVC. 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 There is stable cardiomegaly with central vascular prominence without overt edema. There is increasing consolidation in the right lower lung field, increased small right pleural effusion. Left lung, right upper lung fields remain clear allowing for rotation. 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 Flat plate abdomen study, upper abdomen only 8:38 p.m.: In contrast with today's earlier film, the feeding tube curves to the left and up within the stomach with the tip in the fundal area, previously was oriented to the right in the most distal stomach. The bowel pattern appears nonobstructive, but is not fully visible. There is no supine evidence of free air. IMPRESSION: 1. ETT tip 1.8 cm from the carina. 2. Left IJ central line remains laterally directed with the tip probably in the mid SVC. 3. Increasing consolidation in the right lower lung field, increased small right pleural effusion. 4. Cardiomegaly with central vascular prominence without overt edema. 5. Feeding tube curves to the left and up within the stomach with the tip in the fundal area, previously was oriented to the right in the most distal stomach. Electronically Signed   By: Almira Bar M.D.   On: 08/03/2023 21:27   DG Abd 1 View Result Date: 08/03/2023 CLINICAL DATA:  [id number], endotracheal tube present. [id number], encounter for orogastric tube placement. EXAM: PORTABLE CHEST 1 VIEW PORTABLE ABDOMEN 1 VIEW COMPARISON:  Portable chest yesterday at 6:53 a.m.,  portable abdomen today at 5:49 p.m. FINDINGS: Chest AP portable 8:25 p.m.: ETT tip is 1.8 cm from the carina. The patient is again rotated to the right. Left IJ central line remains laterally directed with the tip probably in the mid SVC. 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 There is stable cardiomegaly with central vascular prominence without overt edema. There is increasing consolidation in the right lower lung field, increased small right pleural effusion. Left lung, right upper lung fields remain clear allowing for rotation. 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 Flat plate abdomen study, upper abdomen only 8:38 p.m.: In contrast with today's earlier film, the feeding tube curves to the left and up within the stomach with the tip in the fundal area, previously was oriented to the right in the most distal stomach. The bowel pattern appears nonobstructive, but is not fully visible. There is no supine evidence of free air. IMPRESSION: 1. ETT tip 1.8 cm from the carina. 2. Left IJ central line remains laterally directed with the tip probably in the mid SVC. 3. Increasing consolidation in the right lower lung field, increased small right pleural effusion. 4. Cardiomegaly with central vascular prominence without overt edema. 5. Feeding tube curves to the left and up within the stomach with the tip in the fundal area, previously was oriented to the right in the most distal stomach. Electronically Signed   By: Almira Bar M.D.   On: 08/03/2023 21:27   DG Abd 1 View Result Date: 08/03/2023 CLINICAL DATA:  NG placement. EXAM: ABDOMEN - 1 VIEW COMPARISON:  Abdominal radiograph dated 08/03/2023. FINDINGS: Feeding tube with tip in the right upper abdomen, likely in the distal stomach. IMPRESSION: Feeding tube with tip in the distal stomach. Electronically Signed   By: Elgie Collard M.D.   On: 08/03/2023 19:16   DG Abd Portable 1V Result Date:  08/03/2023 CLINICAL DATA:  Feeding tube placement EXAM: PORTABLE ABDOMEN - 1 VIEW COMPARISON:  None Available. FINDINGS: Mild bibasilar atelectasis.  No pleural effusion or pneumothorax. Mild cardiomegaly. Enteric tube is poorly visualized due to penetration. This is favored to course below the diaphragm into the stomach but is poorly visualized/evaluated. Stable left IJ central venous catheter likely abutting the right lateral aspect of the SVC. IMPRESSION: Enteric tube is poorly visualized due to penetration, favored to course below the diaphragm into the stomach, but poorly evaluated. Abdominal radiograph centered at the diaphragm is suggested to confirm current positioning. Electronically Signed   By: Charline Bills M.D.   On: 08/03/2023 17:15   DG Chest Port 1 View Result Date: 08/02/2023 CLINICAL DATA:  Acute respiratory failure.  Shortness of breath. EXAM: PORTABLE CHEST 1 VIEW COMPARISON:  Radiographs 08/01/2023 and 07/28/2023.  CT 07/24/2023. FINDINGS: 0653 hours. Two views submitted. Patient remains rotated to the right. Endotracheal tube is unchanged, terminating at the mid tracheal level. There is a stable left IJ central venous catheter directed laterally at the level of the mid SVC. Enteric tube projects into the right upper quadrant of the abdomen. Stable cardiac enlargement and superior mediastinal widening attributed to fat based on prior CT. No significant change in asymmetric left perihilar airspace disease. No evidence of significant pleural effusion or pneumothorax. IMPRESSION: 1. No significant change in asymmetric left perihilar airspace disease. 2. Stable support system. Electronically Signed   By: Carey Bullocks M.D.   On: 08/02/2023 10:43   DG Abd 1 View Result Date: 08/01/2023 CLINICAL DATA:  NG tube placement. EXAM: ABDOMEN - 1 VIEW COMPARISON:  07/31/2023. FINDINGS: NG tube passes below the diaphragm into the distal stomach. Mild gaseous distension the small bowel and colon.  IMPRESSION: Well-positioned NG tube. Electronically Signed   By: Amie Portland M.D.   On: 08/01/2023 17:56   DG CHEST PORT 1 VIEW Result Date: 08/01/2023 CLINICAL DATA:  Intubation. EXAM: PORTABLE CHEST 1 VIEW COMPARISON:  07/28/2023.  CT, 07/24/2023. FINDINGS: Endotracheal tube tip projects 1.5 cm above the carina. Nasal/orogastric tube passes below the diaphragm well into the stomach. Stable enlargement the cardiac silhouette. Hazy perihilar and lung base opacities are noted that are similar to the prior exam. Bilateral pleural effusions suspected. No pneumothorax. IMPRESSION: 1. Well-positioned support apparatus. 2. No change in the appearance of the chest since the prior study. Perihilar and lung base opacities consistent with pulmonary edema and pleural effusions. Stable cardiomegaly. Electronically Signed   By: Amie Portland M.D.   On: 08/01/2023 17:55   DG Abd Portable 1V Result Date: 07/31/2023 CLINICAL DATA:  Nasogastric tube placement. EXAM: PORTABLE ABDOMEN - 1 VIEW COMPARISON:  July 29, 2023. FINDINGS: Distal tip of nasogastric tube is in expected position of either the distal stomach or proximal duodenum. IMPRESSION: Distal tip of nasogastric tube seen in expected position of either distal stomach or proximal duodenum. Electronically Signed   By: Lupita Raider M.D.   On: 07/31/2023 11:47   CT HEAD WO CONTRAST ( ) Result Date: 07/29/2023 CLINICAL DATA:  Mental status change with unknown cause. EXAM: CT HEAD WITHOUT CONTRAST TECHNIQUE: Contiguous axial  images were obtained from the base of the skull through the vertex without intravenous contrast. RADIATION DOSE REDUCTION: This exam was performed according to the departmental dose-optimization program which includes automated exposure control, adjustment of the mA and/or kV according to patient size and/or use of iterative reconstruction technique. COMPARISON:  None Available. FINDINGS: Brain: Unavoidable artifact from EEG leads with  streak. No evidence of acute infarct, hemorrhage, hydrocephalus, mass, or collection. No focal cortical finding. Mild for age cerebral volume loss and white matter low-density. Vascular: No hyperdense vessel or unexpected calcification. Skull: Normal. Negative for fracture or focal lesion. Sinuses/Orbits: Generalized opacification of the paranasal sinuses. Negative orbits. IMPRESSION: 1. No acute or focal intracranial finding. 2. Generalized sinus opacification in the setting of intubation. Electronically Signed   By: Tiburcio Pea M.D.   On: 07/29/2023 18:57   Overnight EEG with video Result Date: 07/29/2023 Charlsie Quest, MD     07/29/2023  1:14 PM Patient Name: RACHID HLAVATY MRN: [id number] Epilepsy Attending: Charlsie Quest Referring Physician/Provider: Erick Blinks, MD Duration: 07/29/2023 0419 to 1225 Patient history: 68 y.o. male morbidly obese, osteoarthritis, admitted to [facility] with decompensated CHF, and initially on Bipap for hypercapnea and then intubated. While at [facility], he was noted to have intermittent facial twitching concerning for myoclonic jerks vs seizure. EEG to evaluate for seizure Level of alertness: comatose AEDs during EEG study: LEV Technical aspects: This EEG study was done with scalp electrodes positioned according to the 10-20 International system of electrode placement. Electrical activity was reviewed with band pass filter of 1-70Hz , sensitivity of 7 uV/mm, display speed of 26mm/sec with a 60Hz  notched filter applied as appropriate. EEG data were recorded continuously and digitally stored.  Video monitoring was available and reviewed as appropriate. Description: EEG showed continuous generalized 3 to 5 Hz theta-delta slowing. Hyperventilation and photic stimulation were not performed.   On 07/29/2023 at around 0728, patient was noted to have right facial twitching.  This was after patient was suctioned.  Concomitant EEG before, during and after the event did not show any EEG  changes suggest seizure. Event button was pressed on 07/29/2023 at 0837 and 0843 for bilateral eyelid twitching. Concomitant EEG before, during and after the event did not show any EEG changes suggest seizure. ABNORMALITY - Continuous slow, generalized IMPRESSION: This study is suggestive of severe diffuse encephalopathy. No seizures or epileptiform discharges were seen throughout the recording. On 07/29/2023 at around 0728, patient was noted to have right facial twitching without concomitant EEG change.  This was not an epileptic event.  Myoclonus can have similar appearance.  Clinical correlation is recommended. Event button was pressed on 07/29/2023 at 0837 and 0843 for bilateral eyelid twitching without concomitant EEG change.  This was not an epileptic event.  Myoclonus can have similar appearance.  Charlsie Quest   DG Abd Portable 1V Result Date: 07/29/2023 CLINICAL DATA:  NG tube placement. EXAM: PORTABLE ABDOMEN - 1 VIEW COMPARISON:  CT, 07/24/2023. FINDINGS: Nasogastric tube passes well below the diaphragm, tip in the right mid abdomen consistent with the distal stomach or proximal duodenum. IMPRESSION: Well-positioned nasogastric tube. Electronically Signed   By: Amie Portland M.D.   On: 07/29/2023 08:29   DG Chest Port 1 View Result Date: 07/29/2023 CLINICAL DATA:  Hypoxia EXAM: PORTABLE CHEST 1 VIEW COMPARISON:  07/28/2023 FINDINGS: Endotracheal tube with tip measuring 3.8 cm above the carina. Enteric tube is partially obscured but appears to extend below the left hemidiaphragm. Left central venous catheter with  tip projecting laterally towards the sidewall likely at the junction of the brachiocephalic and SVC veins. No change. Diffuse cardiac enlargement. Small bilateral pleural effusions. Perihilar and basilar infiltrates, likely edema. No pneumothorax. IMPRESSION: Appliances appear unchanged in position. Cardiac enlargement with bilateral pleural effusions and perihilar infiltrates.  Electronically Signed   By: Burman Nieves M.D.   On: 07/29/2023 00:17   Rapid EEG Result Date: 07/28/2023 Charlsie Quest, MD     07/29/2023  1:48 PM Patient Name: SCOTTY KRATOVIL MRN: [id number] Epilepsy Attending: Charlsie Quest Referring Physician/Provider: Kathlene Cote, PA-C Duration: 07/28/2023 1831 to 2217 Patient history:  68 y.o. male morbidly obese, osteoarthritis, admitted to [facility] with decompensated CHF, and initially on Bipap for hypercapnea and then intubated. He was noted to have intermittent facial twitching concerning for myoclonic jerks vs seizure. EEG to evaluate for seizure Level of alertness:  lethargic AEDs during EEG study: None Technical aspects: This EEG was obtained using a 10 lead EEG system positioned circumferentially without any parasagittal coverage (rapid EEG). Computer selected EEG is reviewed as  well as background features and all clinically significant events. Description: EEG showed continuous/ generalized 3 to 6 Hz theta-delta slowing. Event button was pressed on 07/28/2023 at 1832 and 1914 for bilateral eyelid twitching and facial twitching. Concomitant EEG  showed rhythmic sharply contoured waves in right temporal region without definite evolution  Hyperventilation and photic stimulation were not performed.   ABNORMALITY - Continuous slow, generalized IMPRESSION: This limited ceribell EEG is suggestive of moderate diffuse encephalopathy. Event button was pressed on 07/28/2023 at 1832 and 1914 for bilateral eyelid twitching and facial twitching. Concomitant EEG showed rhythmic sharply contoured waves in right temporal region without definite evolution. Due to limited electrodes, lack of video and no evolution seen on eeg, these events are most likely NOT epileptic. However, focal motor seizure may not be seen on ceribell. Recommend conventional eeg with video for further evaluation. Dr Katrinka Blazing and Dr Derry Lory wee notified Priyanka Annabelle Harman   VAS [place] LOWER EXTREMITY VENOUS  (DVT) Result Date: 07/28/2023  Lower Venous DVT Study Patient Name:  CONLEE JONASSEN  Date of Exam:   07/28/2023 Medical Rec #: [id number]      Accession #:    [id number] Date of Birth: Oct 15, 1955      Patient Gender: M Patient Age:   42 years Exam Location:  [facility] Procedure:      VAS [place] LOWER EXTREMITY VENOUS (DVT) Referring Phys: Levon Hedger --------------------------------------------------------------------------------  Indications: Edema.  Risk Factors: None identified. Limitations: Body habitus, poor ultrasound/tissue interface and patient positioning, patient immobility. Comparison Study: No prior studies. Performing Technologist: Chanda Busing RVT  Examination Guidelines: A complete evaluation includes B-mode imaging, spectral Doppler, color Doppler, and power Doppler as needed of all accessible portions of each vessel. Bilateral testing is considered an integral part of a complete examination. Limited examinations for reoccurring indications may be performed as noted. The reflux portion of the exam is performed with the patient in reverse Trendelenburg.  +---------+---------------+---------+-----------+----------+-------------------+ RIGHT    CompressibilityPhasicitySpontaneityPropertiesThrombus Aging      +---------+---------------+---------+-----------+----------+-------------------+ CFV      Full           Yes      Yes                                      +---------+---------------+---------+-----------+----------+-------------------+ SFJ      Full                                                             +---------+---------------+---------+-----------+----------+-------------------+  FV Prox  Full                                                             +---------+---------------+---------+-----------+----------+-------------------+ FV Mid                  Yes      Yes                                       +---------+---------------+---------+-----------+----------+-------------------+ FV Distal               Yes      Yes                                      +---------+---------------+---------+-----------+----------+-------------------+ POP      Full           Yes      Yes                                      +---------+---------------+---------+-----------+----------+-------------------+ PTV                                                   Not well visualized +---------+---------------+---------+-----------+----------+-------------------+ PERO                                                  Not well visualized +---------+---------------+---------+-----------+----------+-------------------+   +---------+---------------+---------+-----------+----------+-------------------+ LEFT     CompressibilityPhasicitySpontaneityPropertiesThrombus Aging      +---------+---------------+---------+-----------+----------+-------------------+ CFV      Full           Yes      Yes                                      +---------+---------------+---------+-----------+----------+-------------------+ SFJ      Full                                                             +---------+---------------+---------+-----------+----------+-------------------+ FV Prox  Full                                                             +---------+---------------+---------+-----------+----------+-------------------+ FV Mid   Full                                                             +---------+---------------+---------+-----------+----------+-------------------+  FV DistalFull                                                             +---------+---------------+---------+-----------+----------+-------------------+ POP      Full           Yes      Yes                                      +---------+---------------+---------+-----------+----------+-------------------+  PTV      Full                                                             +---------+---------------+---------+-----------+----------+-------------------+ PERO                                                  Not well visualized +---------+---------------+---------+-----------+----------+-------------------+     Summary: RIGHT: - There is no evidence of deep vein thrombosis in the lower extremity. However, portions of this examination were limited- see technologist comments above.  - No cystic structure found in the popliteal fossa.  LEFT: - There is no evidence of deep vein thrombosis in the lower extremity. However, portions of this examination were limited- see technologist comments above.  - No cystic structure found in the popliteal fossa.  *See table(s) above for measurements and observations. Electronically signed by Lemar Livings MD on 07/28/2023 at 5:15:54 PM.    Final    DG Chest 1 View Result Date: 07/28/2023 CLINICAL DATA:  Respiratory failure and heart failure. EXAM: CHEST  1 VIEW COMPARISON:  07/25/2023 FINDINGS: Stable cardiac enlargement. Endotracheal tube tip approximately 2 cm above the carina. Lungs demonstrate mild increase in diffuse bilateral pulmonary interstitial edema. No visualized pneumothorax. Evaluation for pleural effusions difficult based on body habitus. IMPRESSION: Mild increase in diffuse bilateral pulmonary interstitial edema. Electronically Signed   By: Irish Lack M.D.   On: 07/28/2023 09:58   ECHOCARDIOGRAM COMPLETE Result Date: 07/25/2023    ECHOCARDIOGRAM REPORT   Patient Name:   CRISTIN THAKER Date of Exam: 07/25/2023 Medical Rec #:  [id number]     Height:       67.0 in Accession #:    [id number]    Weight:       455.0 lb Date of Birth:  14-Apr-1956     BSA:          2.868 m Patient Age:    67 years      BP:           111/73 mmHg Patient Gender: M             HR:           57 bpm. Exam Location:  Inpatient Procedure: 2D Echo, Cardiac Doppler, Color Doppler  and Intracardiac            Opacification Agent Indications:     I50.40* Unspecified  combined systolic (congestive) and                  diastolic (congestive) heart failure  History:         Patient has no prior history of Echocardiogram examinations.                  CHF and Cardiomegaly, Pulmonary HTN, Arrythmias:VT;                  Signs/Symptoms:Chest Pain, Dyspnea, Shortness of Breath and                  Edema.  Sonographer:     Sheralyn Boatman RDCS Referring Phys:  [id number] Patrici Ranks Diagnosing Phys: Lennie Odor MD  Sonographer Comments: Technically difficult study due to poor echo windows, patient is obese and echo performed with patient supine and on artificial respirator. Image acquisition challenging due to patient body habitus. Delayed for labs by RN. IMPRESSIONS  1. Left ventricular ejection fraction, by estimation, is <20%. The left ventricle has severely decreased function. The left ventricle demonstrates global hypokinesis. The left ventricular internal cavity size was mildly dilated. There is mild concentric  left ventricular hypertrophy. Left ventricular diastolic parameters are consistent with Grade I diastolic dysfunction (impaired relaxation).  2. Right ventricular systolic function is severely reduced. The right ventricular size is severely enlarged. There is normal pulmonary artery systolic pressure. The estimated right ventricular systolic pressure is 29.4 mmHg.  3. The mitral valve is grossly normal. No evidence of mitral valve regurgitation. No evidence of mitral stenosis.  4. The aortic valve is tricuspid. Aortic valve regurgitation is trivial. No aortic stenosis is present.  5. There is mild dilatation of the ascending aorta, measuring 41 mm.  6. The inferior vena cava is dilated in size with <50% respiratory variability, suggesting right atrial pressure of 15 mmHg. Comparison(s): No prior Echocardiogram. Conclusion(s)/Recommendation(s): No left ventricular mural or apical  thrombus/thrombi. FINDINGS  Left Ventricle: Left ventricular ejection fraction, by estimation, is <20%. The left ventricle has severely decreased function. The left ventricle demonstrates global hypokinesis. Definity contrast agent was given IV to delineate the left ventricular endocardial borders. The left ventricular internal cavity size was mildly dilated. There is mild concentric left ventricular hypertrophy. Left ventricular diastolic parameters are consistent with Grade I diastolic dysfunction (impaired relaxation). Right Ventricle: The right ventricular size is severely enlarged. No increase in right ventricular wall thickness. Right ventricular systolic function is severely reduced. There is normal pulmonary artery systolic pressure. The tricuspid regurgitant velocity is 1.90 m/s, and with an assumed right atrial pressure of 15 mmHg, the estimated right ventricular systolic pressure is 29.4 mmHg. Left Atrium: Left atrial size was normal in size. Right Atrium: Right atrial size was normal in size. Pericardium: There is no evidence of pericardial effusion. Mitral Valve: The mitral valve is grossly normal. No evidence of mitral valve regurgitation. No evidence of mitral valve stenosis. Tricuspid Valve: The tricuspid valve is grossly normal. Tricuspid valve regurgitation is mild . No evidence of tricuspid stenosis. Aortic Valve: The aortic valve is tricuspid. Aortic valve regurgitation is trivial. No aortic stenosis is present. Pulmonic Valve: The pulmonic valve was grossly normal. Pulmonic valve regurgitation is not visualized. No evidence of pulmonic stenosis. Aorta: The aortic root is normal in size and structure. There is mild dilatation of the ascending aorta, measuring 41 mm. Venous: The inferior vena cava is dilated in size with less than 50% respiratory variability, suggesting right atrial pressure of  15 mmHg. IAS/Shunts: The atrial septum is grossly normal.  LEFT VENTRICLE PLAX 2D LVIDd:         5.70 cm       Diastology LVIDs:         4.70 cm      LV e' medial:    3.15 cm/s LV PW:         1.20 cm      LV E/e' medial:  9.1 LV IVS:        1.10 cm      LV e' lateral:   3.15 cm/s LVOT diam:     2.40 cm      LV E/e' lateral: 9.1 LV SV:         61 LV SV Index:   21 LVOT Area:     4.52 cm  LV Volumes (MOD) LV vol d, MOD A2C: 136.0 ml LV vol d, MOD A4C: 151.5 ml LV vol s, MOD A2C: 115.0 ml LV vol s, MOD A4C: 108.7 ml LV SV MOD A2C:     21.0 ml LV SV MOD A4C:     151.5 ml LV SV MOD BP:      29.3 ml RIGHT VENTRICLE            IVC RV S prime:     4.03 cm/s  IVC diam: 3.10 cm TAPSE (M-mode): 0.9 cm LEFT ATRIUM             Index        RIGHT ATRIUM           Index LA diam:        3.90 cm 1.36 cm/m   RA Area:     22.90 cm LA Vol (A2C):   43.5 ml 15.17 ml/m  RA Volume:   77.40 ml  26.99 ml/m LA Vol (A4C):   69.2 ml 24.13 ml/m LA Biplane Vol: 56.0 ml 19.53 ml/m  AORTIC VALVE LVOT Vmax:   70.70 cm/s LVOT Vmean:  41.100 cm/s LVOT VTI:    0.134 m  AORTA Ao Root diam: 3.50 cm Ao Asc diam:  4.10 cm MITRAL VALVE               TRICUSPID VALVE MV Area (PHT): 3.65 cm    TR Peak grad:   14.4 mmHg MV Decel Time: 208 msec    TR Vmax:        190.00 cm/s MV E velocity: 28.70 cm/s MV A velocity: 44.60 cm/s  SHUNTS MV E/A ratio:  0.64        Systemic VTI:  0.13 m                            Systemic Diam: 2.40 cm Lennie Odor MD Electronically signed by Lennie Odor MD Signature Date/Time: 07/25/2023/1:28:30 PM    Final (Updated)    US Abdomen Complete Result Date: 07/25/2023 CLINICAL DATA:  Pulmonary edema. EXAM: ABDOMEN ULTRASOUND COMPLETE COMPARISON:  None Available. FINDINGS: Gallbladder: Limited evaluation of the gallbladder secondary to limited patient positioning. No gallstones or wall thickening visualized (4.2 mm). No sonographic Murphy sign noted by sonographer. Common bile duct: Diameter: N/A (not clearly visualized) Liver: No focal lesion identified. Diffusely increased echogenicity of the liver parenchyma is noted. Portal  vein is patent on color Doppler imaging with normal direction of blood flow towards the liver. IVC: No abnormality visualized. Pancreas: Visualized portion unremarkable. Spleen: Size (12.8 cm) and appearance within normal  limits. Right Kidney: Length: 9.7 cm. Echogenicity within normal limits. No mass or hydronephrosis visualized. Left Kidney: Length: 10.5 cm. Echogenicity within normal limits. No mass or hydronephrosis visualized. Abdominal aorta: No aneurysm visualized (1.7 cm in AP diameter). Other findings: It should be noted that the study is markedly limited secondary to the patient's body habitus and patient intubation. IMPRESSION: 1. Markedly limited study, as described above. 2. Hepatic steatosis without focal liver lesions. Electronically Signed   By: Aram Candela M.D.   On: 07/25/2023 02:10   DG Chest Portable 1 View Result Date: 07/25/2023 CLINICAL DATA:  Check intubation.  ETT placement. EXAM: PORTABLE CHEST 1 VIEW COMPARISON:  Chest CT without contrast yesterday at 6:47 p.m. FINDINGS: 12:34 a.m. ETT interval insertion, the tip 3.2 cm from the carina. NGT also new, enters the stomach with the intragastric course not filmed. Also new left IJ central line the tip in the mid SVC. 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 Aortic atherosclerosis Moderate cardiomegaly. There is mild central vascular prominence without overt edema. The lungs are clear of focal infiltrates. No pleural effusion is seen. No new osseous findings. IMPRESSION: 1. ETT tip 3.2 cm from the carina. 2. NGT enters the stomach with the intragastric course not filmed. 3. Left IJ central line tip in the mid SVC. 4. Cardiomegaly with mild central vascular prominence without overt edema. 5. Aortic atherosclerosis. Electronically Signed   By: Almira Bar M.D.   On: 07/25/2023 01:12   CT CHEST ABDOMEN PELVIS WO CONTRAST Result Date: 07/24/2023 CLINICAL DATA:  Shortness of breath EXAM: CT CHEST, ABDOMEN AND PELVIS  WITHOUT CONTRAST TECHNIQUE: Multidetector CT imaging of the chest, abdomen and pelvis was performed following the standard protocol without IV contrast. RADIATION DOSE REDUCTION: This exam was performed according to the departmental dose-optimization program which includes automated exposure control, adjustment of the mA and/or kV according to patient size and/or use of iterative reconstruction technique. COMPARISON:  None Available. FINDINGS: CT CHEST FINDINGS Cardiovascular: Cardiomegaly. No pericardial effusion. Thoracic aorta is nonaneurysmal. Atherosclerotic calcifications of the aorta and coronary arteries. Central pulmonary vasculature is moderately dilated. Mediastinum/Nodes: No enlarged mediastinal, hilar, or axillary lymph nodes. Thyroid gland, trachea, and esophagus demonstrate no significant findings. Lungs/Pleura: Mild bibasilar subsegmental atelectasis. Lungs are otherwise clear. No pleural effusion or pneumothorax. Musculoskeletal: No acute osseous abnormality. Multilevel bridging anterior endplate osteophytes throughout the thoracic spine compatible with diffuse idiopathic skeletal hyperostosis. Gynecomastia is present on the left. CT ABDOMEN PELVIS FINDINGS Hepatobiliary: Unremarkable unenhanced appearance of the liver. There may be layering sludge within the gallbladder. No pericholecystic inflammatory changes by CT. No biliary dilatation. Pancreas: Unremarkable. No pancreatic ductal dilatation or surrounding inflammatory changes. Spleen: Normal in size without focal abnormality. Adrenals/Urinary Tract: Adrenal glands are unremarkable. Kidneys are normal, without renal calculi, focal lesion, or hydronephrosis. Bladder is unremarkable. Stomach/Bowel: Stomach is within normal limits. Scattered colonic diverticulosis. No evidence of bowel wall thickening, distention, or inflammatory changes. Vascular/Lymphatic: Aortic atherosclerosis. No enlarged abdominal or pelvic lymph nodes. Reproductive: Prostate  is unremarkable. Other: No free fluid. No abdominopelvic fluid collection. No pneumoperitoneum. No abdominal wall hernia. Musculoskeletal: Skin thickening and subcutaneous edema of the lower anterior abdominal wall/pannus. No fluid collections. No acute osseous abnormality. Appearance of the proximal right femur with coarsened trabecula suggestive of Paget's disease. IMPRESSION: 1. Skin thickening and subcutaneous edema of the lower anterior abdominal wall/pannus. Correlate for cellulitis. No fluid collections. 2. Otherwise, no acute intrathoracic or intra-abdominal findings. 3. Cardiomegaly with moderate dilatation of the  central pulmonary vasculature, which can be seen in the setting of pulmonary hypertension. 4. Colonic diverticulosis without evidence of acute diverticulitis. 5. Appearance of the proximal right femur with coarsened trabecula suggestive of Paget's disease. 6. Aortic atherosclerosis (ICD10-I70.0). Electronically Signed   By: Duanne Guess D.O.   On: 07/24/2023 19:31   DG Chest 2 View Result Date: 07/24/2023 CLINICAL DATA:  Shortness of breath. EXAM: CHEST - 2 VIEW COMPARISON:  None Available. FINDINGS: Limited exam. Low lung volume. Mild central vascular congestion, likely accentuated by low lung volume. No frank pulmonary edema. Bilateral lung fields are otherwise clear. No acute consolidation or lung collapse. Bilateral costophrenic angles are clear. Moderately enlarged cardio-mediastinal silhouette, which is accentuated by low lung volume and AP technique. No acute osseous abnormalities. The soft tissues are within normal limits. IMPRESSION: *Limited exam.  No active cardiopulmonary disease. Correlate clinically to determine the need for additional imaging with chest CT scan. Electronically Signed   By: Jules Schick M.D.   On: 07/24/2023 15:55    Microbiology Recent Results (from the past 240 hours)  Culture, Respiratory w Gram Stain     Status: None   Collection Time: 08/02/23  10:54 AM   Specimen: Tracheal Aspirate; Respiratory  Result Value Ref Range Status   Specimen Description TRACHEAL ASPIRATE  Final   Special Requests NONE  Final   Gram Stain   Final    FEW WBC SEEN NO ORGANISMS SEEN Performed at [facility], [address], [place], [place] [address]    Culture RARE CANDIDA ALBICANS  Final   Report Status 08/05/2023 FINAL  Final    Lab Basic Metabolic Panel: Recent Labs  Lab 08/02/23 0620 08/03/23 0543 08/03/23 1938 08/05/23 1424 08/06/23 0440 08/06/23 1643 08/07/23 0451 08/23/2023 0327  NA 153* 152*   < > 147* 145 144 143 142  K 4.0 3.4*   < > 3.7 3.4* 3.9 3.6 4.4  CL 114* 113*   < > 110 106 106 104 103  CO2 30 30   < > 27 28 29 29 29   GLUCOSE 114* 152*   < > 136* 159* 136* 155* 189*  BUN 67* 58*   < > 43* 48* 46* 53* 63*  CREATININE 2.28* 1.70*   < > 1.40* 1.41* 1.35* 1.31* 1.44*  CALCIUM 8.7* 8.1*   < > 8.9 8.9 8.8* 9.1 9.0  MG 2.5* 2.4  --   --  1.8  --  1.8  --   PHOS 2.8 2.0*  --   --   --   --   --   --    < > = values in this interval not displayed.   Liver Function Tests: Recent Labs  Lab 08/06/23 0440  AST 23  ALT 25  ALKPHOS 62  BILITOT 1.1  PROT 6.4*  ALBUMIN 2.3*   No results for input(s): "LIPASE", "AMYLASE" in the last 168 hours. No results for input(s): "AMMONIA" in the last 168 hours. CBC: Recent Labs  Lab 08/04/23 0502 08/05/23 0341 08/06/23 0440 08/07/23 0451 08/03/2023 0327  WBC 7.8 6.6 6.4 6.3 6.0  HGB 12.2* 12.5* 12.7* 12.4* 12.7*  HCT 42.0 42.3 43.1 41.2 42.9  MCV 86.6 84.6 84.0 83.1 84.0  PLT 144* 147* 157 181 242   Cardiac Enzymes: No results for input(s): "CKTOTAL", "CKMB", "CKMBINDEX", "TROPONINI" in the last 168 hours. Sepsis Labs: Recent Labs  Lab 08/05/23 0341 08/06/23 0440 08/07/23 0451 08/14/2023 0327  WBC 6.6 6.4 6.3 6.0

## 2023-08-26 NOTE — Accreditation Note (Signed)
Restraint death within 24 hours of soft bilateral wrist restraints logged by Laurene Footman RN on 08/13/2023 at 1501

## 2023-08-26 NOTE — Progress Notes (Signed)
NAME:  Levi [name], MRN:  [id number], DOB:  [birth year], LOS: 15 ADMISSION DATE:  07/24/2023, CONSULTATION DATE: 07/24/2023  REFERRING MD: Marlene Bast, CHIEF COMPLAINT: Shortness of breath and hypoxia  History of Present Illness:  68 year old man with morbid obesity, probably untreated OSA/OHS and knee OA who presented to ED with dry cough, DOE, SOB, LL edema, orthopnea for 1-2 weeks. Home health RN found his SpO2 64% on RA. Given a dose of Albuterol. Denied wheezing, CP, N/V/D, abd pain, f/c/r, and rash. Not on home O2 or NIV.  No smoking, illicit drug use, or alcoholism.  No hx of COPD, asthma, VTE, cardiac hx, liver disease, or CKD Developed runs of VT in ED. Admitted with biventricular failure , H. influenzae pneumonia.  Course complicated by seizures and aspiration episode  Pertinent Medical History:  History reviewed. No pertinent past medical history.  Significant Hospital Events: Including procedures, antibiotic start and stop dates in addition to other pertinent events   11/29 Admitted to [facility] 12/3 Trach aspirate sent for thick secretions. Amiodarone started for AF w/ bigeminy and trigeminy. New onset rhythmic twitching left face, LLE. Resolved spontaneously. 12/4 Seen by Neuro: myoclonic jerks noted on [employer] monitor but not able to distinguish if seizure activity. Transferred to [facility] for LTM. Started Keppra.  Febrile. Sats 88%. LEUS negative for DVT. LTM neg for seizure so Keppra stopped later that day.  Had been started on vanc and zosyn 12/3 for fever CT was neg for stroke. Vanc stopped 12/5 sputum H flu BL positive. Change to CTX. More awake. Trying to wean sedation to assess weaning ability. Renal fxn improved after holding Lasix. 12/6 extubated , BiPAP DC'd due to vomiting 12/7 recurrent vomiting with aspiration >> reintubated 12/9 extubated, doing well.  Later in the day fell asleep while on his back became encephalopathic desaturated with SBT reintubated due to  hypoxemia and hypercarbia on blood gas  Interim History / Subjective:   Responded to diuresis.  On sedation Febrile.  Had bowel movement.   Objective:  Blood pressure 103/60, pulse 78, temperature (!) 101.1 F (38.4 C), temperature source Axillary, resp. rate (!) 25, height 5' 7.01" (1.702 m), weight (!) 194.1 kg, SpO2 91%. CVP:  [8 mmHg-12 mmHg] 12 mmHg  Vent Mode: PRVC FiO2 (%):  [40 %-50 %] 50 % Set Rate:  [24 bmp] 24 bmp Vt Set:  [530 mL] 530 mL PEEP:  [10 cmH20] 10 cmH20 Plateau Pressure:  [22 cmH20-24 cmH20] 23 cmH20   Intake/Output Summary (Last 24 hours) at 08/19/2023 0910 Last data filed at 07/26/2023 0900 Gross per 24 hour  Intake 2829.38 ml  Output 3445 ml  Net -615.62 ml   Filed Weights   08/06/23 0500 08/07/23 0500 08/21/2023 0339  Weight: (!) 195 kg (!) 193.9 kg (!) 194.1 kg   Physical Examination: Gen:      Intubated, sedated, acutely and chronically ill appearing HEENT:  ETT to vent Lungs:    sounds of mechanical ventilation auscultated no wheeze CV:         RRR Abd:      Obese, soft Ext:   +upper extremity edema Skin:      Warm and dry; no rashes Neuro:   sedated, RASS -3   Assessment & Plan:   Acute Hypoxic and Hypercapnic respiratory failure -secondary to pulmonary edema and complicated further by BL positive Haemophilus influenza PNA (12/3) , reintubated 12/7 for aspiration pneumonia, possible exacerbation of hypercapnia due to pulmonary edema, reintubated 12/9 for hypercapnia Presumed  OSA/OHS - initially Zosyn x 3 days transitioned to ceftriaxone , plan for 7 total days from aspiration event 12/7 - wean vent settings as tolerated.  -VAP bundle stressors or prophylaxis - continue net negative goals with diuresis today. Plan for one way extubation later this weekend per family meeting 12/13.   Acute-on-chronic systolic heart failure Echo from 11/30 with EF <20%, G1DD, severely reduced RVSF, severely enlarged RV - Cardiology following, appreciate  assistance; considering R/LHC when more stable - Cardiac monitoring/tele - continue lasix 80 mg TID  New onset AF w/ intermittent ventricular ectopy, CHA2DS2/VASC score 2.  Bigeminy/trigeminy VT Started on amio and heparin 12/3.  - Cardiac monitoring - Optimize electrolytes as able. Supplement K today - continue amio oral, stop the gtt. to minimize fluids as well as was in sinus rhythm, hold heparin mild bleeding in suction tubing  Acute kidney injury w/ rising Cr in context of on-going diuresis  Renal fxn improved w/ holding lasix. - Trend BMP, Cr improving - Replete electrolytes as indicated - Monitor I&Os, goal euvolemia - Avoid nephrotoxic agents as able  Acute metabolic encephalopathy  Improved.  Myoclonic jerks attributed to uremia and sedation Possible seizure. Non witnessed by LTM. Felt metabolic in nature. CT head obtained and it was reassuring w/out new clear stroke/injury - Monitor off of AEDs - Keppra discontinued 12/4 -- Spot EEG 12/11 with left shoulder shivering negative for seizure  Uncontrolled diabetes, now hypoglycemic while n.p.o. - SSI - Goal CBG 140-180 -- semglee  Ileus/vomiting/constipation: -- s/p reglan trial -- responded to bowel regimen  Hypernatremia: Improving -free water via TF, decrease 12/ 12 to minimize fluid intake  Congestive hepatopathy, improving - Supportive care - Intermittent LFT monitoring with amio  Morbid Obesity - Counseling re: weight loss/lifestyle changes  Best Practice: (right click and "Reselect all SmartList Selections" daily)   Diet/type: tubefeeds DVT prophylaxis: IV heparin on hold mild bleeding ET tube  Pressure ulcer(s): not present on admission  GI prophylaxis: H2B Lines: Central line Foley:  Yes, and it is still needed Code Status:  DNR Last date of multidisciplinary goals of care discussion [See IPAL 12/13. Anticipate comfort care with one way extubation today.]  Critical care time:    The patient is  critically ill due to respiratory failure.  Critical care was necessary to treat or prevent imminent or life-threatening deterioration.  Critical care was time spent personally by me on the following activities: development of treatment plan with patient and/or surrogate as well as nursing, discussions with consultants, evaluation of patient's response to treatment, examination of patient, obtaining history from patient or surrogate, ordering and performing treatments and interventions, ordering and review of laboratory studies, ordering and review of radiographic studies, pulse oximetry, re-evaluation of patient's condition and participation in multidisciplinary rounds.   Critical Care Time devoted to patient care services described in this note is 33 minutes. This time reflects time of care of this signee Charlott Holler . This critical care time does not reflect separately billable procedures or procedure time, teaching time or supervisory time of PA/NP/Med student/Med Resident etc but could involve care discussion time.       Charlott Holler [facility] 07/27/2023 9:14 AM  Pager: see AMION  If no response to pager , please call critical care on call (see AMION) until 7pm After 7:00 pm call Elink

## 2023-08-26 DEATH — deceased
